# Patient Record
Sex: Female | Born: 1940 | Race: White | Hispanic: No | Smoking: Never smoker
Health system: Southern US, Community
[De-identification: ages and names within clinical notes are randomized; demographics above are authoritative.]

## PROBLEM LIST (undated history)

## (undated) DIAGNOSIS — I10 Essential (primary) hypertension: Secondary | ICD-10-CM

## (undated) DIAGNOSIS — C801 Malignant (primary) neoplasm, unspecified: Secondary | ICD-10-CM

## (undated) DIAGNOSIS — G459 Transient cerebral ischemic attack, unspecified: Secondary | ICD-10-CM

## (undated) DIAGNOSIS — D751 Secondary polycythemia: Secondary | ICD-10-CM

## (undated) DIAGNOSIS — D099 Carcinoma in situ, unspecified: Secondary | ICD-10-CM

## (undated) DIAGNOSIS — M1990 Unspecified osteoarthritis, unspecified site: Secondary | ICD-10-CM

## (undated) DIAGNOSIS — E785 Hyperlipidemia, unspecified: Secondary | ICD-10-CM

## (undated) DIAGNOSIS — J45909 Unspecified asthma, uncomplicated: Secondary | ICD-10-CM

## (undated) DIAGNOSIS — E079 Disorder of thyroid, unspecified: Secondary | ICD-10-CM

## (undated) DIAGNOSIS — I2510 Atherosclerotic heart disease of native coronary artery without angina pectoris: Secondary | ICD-10-CM

## (undated) DIAGNOSIS — E039 Hypothyroidism, unspecified: Secondary | ICD-10-CM

## (undated) DIAGNOSIS — K900 Celiac disease: Secondary | ICD-10-CM

## (undated) HISTORY — PX: BREAST SURGERY: SHX581

## (undated) HISTORY — DX: Atherosclerotic heart disease of native coronary artery without angina pectoris: I25.10

## (undated) HISTORY — DX: Hyperlipidemia, unspecified: E78.5

## (undated) HISTORY — PX: EYE SURGERY: SHX253

## (undated) HISTORY — PX: TOTAL THYROIDECTOMY: SHX2547

## (undated) HISTORY — PX: ABDOMINAL HYSTERECTOMY: SHX81

## (undated) HISTORY — PX: BLADDER SURGERY: SHX569

---
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
## 1998-04-26 ENCOUNTER — Emergency Department (HOSPITAL_COMMUNITY): Admission: EM | Admit: 1998-04-26 | Discharge: 1998-04-26 | Payer: Self-pay | Admitting: Emergency Medicine

## 1999-07-13 ENCOUNTER — Encounter: Payer: Self-pay | Admitting: Internal Medicine

## 1999-07-13 ENCOUNTER — Encounter: Admission: RE | Admit: 1999-07-13 | Discharge: 1999-07-13 | Payer: Self-pay | Admitting: *Deleted

## 2000-07-14 ENCOUNTER — Encounter: Payer: Self-pay | Admitting: Internal Medicine

## 2000-07-14 ENCOUNTER — Encounter: Admission: RE | Admit: 2000-07-14 | Discharge: 2000-07-14 | Payer: Self-pay | Admitting: Internal Medicine

## 2000-11-01 ENCOUNTER — Other Ambulatory Visit: Admission: RE | Admit: 2000-11-01 | Discharge: 2000-11-01 | Payer: Self-pay | Admitting: Internal Medicine

## 2001-07-17 ENCOUNTER — Encounter: Admission: RE | Admit: 2001-07-17 | Discharge: 2001-07-17 | Payer: Self-pay | Admitting: Internal Medicine

## 2001-07-17 ENCOUNTER — Encounter: Payer: Self-pay | Admitting: Internal Medicine

## 2011-01-19 ENCOUNTER — Encounter: Payer: Self-pay | Admitting: Internal Medicine

## 2012-05-31 ENCOUNTER — Encounter: Payer: Self-pay | Admitting: Internal Medicine

## 2014-05-19 ENCOUNTER — Other Ambulatory Visit: Payer: Self-pay

## 2014-05-19 ENCOUNTER — Other Ambulatory Visit: Payer: Self-pay | Admitting: Internal Medicine

## 2014-05-19 DIAGNOSIS — Z1231 Encounter for screening mammogram for malignant neoplasm of breast: Secondary | ICD-10-CM

## 2014-06-16 ENCOUNTER — Ambulatory Visit
Admission: RE | Admit: 2014-06-16 | Discharge: 2014-06-16 | Disposition: A | Discharge status: Home / Self Care | Payer: PRIVATE HEALTH INSURANCE | Source: Ambulatory Visit | Attending: Internal Medicine | Admitting: Internal Medicine

## 2014-06-16 DIAGNOSIS — Z1231 Encounter for screening mammogram for malignant neoplasm of breast: Secondary | ICD-10-CM

## 2014-06-18 ENCOUNTER — Ambulatory Visit: Payer: Self-pay

## 2015-06-19 ENCOUNTER — Other Ambulatory Visit: Payer: Self-pay

## 2015-06-19 DIAGNOSIS — Z1231 Encounter for screening mammogram for malignant neoplasm of breast: Secondary | ICD-10-CM

## 2015-07-10 ENCOUNTER — Ambulatory Visit
Admission: RE | Admit: 2015-07-10 | Discharge: 2015-07-10 | Disposition: A | Discharge status: Home / Self Care | Payer: Medicare Other | Source: Ambulatory Visit

## 2015-07-10 ENCOUNTER — Ambulatory Visit: Payer: Self-pay

## 2015-07-10 DIAGNOSIS — Z1231 Encounter for screening mammogram for malignant neoplasm of breast: Secondary | ICD-10-CM

## 2015-10-05 ENCOUNTER — Observation Stay (HOSPITAL_COMMUNITY): Payer: Medicare Other

## 2015-10-05 ENCOUNTER — Encounter (HOSPITAL_COMMUNITY): Payer: Self-pay | Admitting: Family Medicine

## 2015-10-05 ENCOUNTER — Observation Stay (HOSPITAL_COMMUNITY)
Admission: EM | Admit: 2015-10-05 | Discharge: 2015-10-07 | Disposition: A | Discharge status: Home / Self Care | Payer: Medicare Other | Attending: Internal Medicine | Admitting: Internal Medicine

## 2015-10-05 ENCOUNTER — Emergency Department (HOSPITAL_COMMUNITY): Payer: Medicare Other

## 2015-10-05 DIAGNOSIS — Z7982 Long term (current) use of aspirin: Secondary | ICD-10-CM | POA: Diagnosis not present

## 2015-10-05 DIAGNOSIS — H547 Unspecified visual loss: Secondary | ICD-10-CM | POA: Diagnosis not present

## 2015-10-05 DIAGNOSIS — K900 Celiac disease: Secondary | ICD-10-CM | POA: Insufficient documentation

## 2015-10-05 DIAGNOSIS — E039 Hypothyroidism, unspecified: Secondary | ICD-10-CM | POA: Insufficient documentation

## 2015-10-05 DIAGNOSIS — E785 Hyperlipidemia, unspecified: Secondary | ICD-10-CM | POA: Insufficient documentation

## 2015-10-05 DIAGNOSIS — R4781 Slurred speech: Secondary | ICD-10-CM | POA: Insufficient documentation

## 2015-10-05 DIAGNOSIS — H532 Diplopia: Secondary | ICD-10-CM | POA: Diagnosis present

## 2015-10-05 DIAGNOSIS — D751 Secondary polycythemia: Secondary | ICD-10-CM | POA: Diagnosis not present

## 2015-10-05 DIAGNOSIS — E079 Disorder of thyroid, unspecified: Secondary | ICD-10-CM | POA: Insufficient documentation

## 2015-10-05 DIAGNOSIS — G459 Transient cerebral ischemic attack, unspecified: Secondary | ICD-10-CM | POA: Diagnosis not present

## 2015-10-05 DIAGNOSIS — R4701 Aphasia: Secondary | ICD-10-CM | POA: Diagnosis not present

## 2015-10-05 DIAGNOSIS — Z8673 Personal history of transient ischemic attack (TIA), and cerebral infarction without residual deficits: Secondary | ICD-10-CM | POA: Diagnosis present

## 2015-10-05 DIAGNOSIS — R51 Headache: Secondary | ICD-10-CM

## 2015-10-05 DIAGNOSIS — R471 Dysarthria and anarthria: Secondary | ICD-10-CM | POA: Diagnosis not present

## 2015-10-05 DIAGNOSIS — R519 Headache, unspecified: Secondary | ICD-10-CM

## 2015-10-05 DIAGNOSIS — I10 Essential (primary) hypertension: Secondary | ICD-10-CM | POA: Diagnosis not present

## 2015-10-05 HISTORY — DX: Secondary polycythemia: D75.1

## 2015-10-05 HISTORY — DX: Unspecified asthma, uncomplicated: J45.909

## 2015-10-05 HISTORY — DX: Disorder of thyroid, unspecified: E07.9

## 2015-10-05 HISTORY — DX: Essential (primary) hypertension: I10

## 2015-10-05 HISTORY — DX: Malignant (primary) neoplasm, unspecified: C80.1

## 2015-10-05 HISTORY — DX: Celiac disease: K90.0

## 2015-10-05 HISTORY — DX: Transient cerebral ischemic attack, unspecified: G45.9

## 2015-10-05 LAB — CBC
HCT: 46.1 % — ABNORMAL HIGH (ref 36.0–46.0)
HEMATOCRIT: 42.4 % (ref 36.0–46.0)
HEMOGLOBIN: 14.1 g/dL (ref 12.0–15.0)
Hemoglobin: 15.5 g/dL — ABNORMAL HIGH (ref 12.0–15.0)
MCH: 29.9 pg (ref 26.0–34.0)
MCH: 29.4 pg (ref 26.0–34.0)
MCHC: 33.6 g/dL (ref 30.0–36.0)
MCHC: 33.3 g/dL (ref 30.0–36.0)
MCV: 88.8 fL (ref 78.0–100.0)
MCV: 88.5 fL (ref 78.0–100.0)
PLATELETS: 336 10*3/uL (ref 150–400)
Platelets: 323 10*3/uL (ref 150–400)
RBC: 4.79 MIL/uL (ref 3.87–5.11)
RBC: 5.19 MIL/uL — AB (ref 3.87–5.11)
RDW: 14.6 % (ref 11.5–15.5)
RDW: 14.5 % (ref 11.5–15.5)
WBC: 5 10*3/uL (ref 4.0–10.5)
WBC: 7.2 10*3/uL (ref 4.0–10.5)

## 2015-10-05 LAB — DIFFERENTIAL
BASOS PCT: 1 %
Basophils Absolute: 0 10*3/uL (ref 0.0–0.1)
EOS ABS: 0.1 10*3/uL (ref 0.0–0.7)
EOS PCT: 1 %
Lymphocytes Relative: 37 %
Lymphs Abs: 1.8 10*3/uL (ref 0.7–4.0)
MONO ABS: 0.4 10*3/uL (ref 0.1–1.0)
Monocytes Relative: 8 %
NEUTROS PCT: 53 %
Neutro Abs: 2.7 10*3/uL (ref 1.7–7.7)

## 2015-10-05 LAB — COMPREHENSIVE METABOLIC PANEL
ALBUMIN: 3.6 g/dL (ref 3.5–5.0)
ALT: 23 U/L (ref 14–54)
ANION GAP: 14 (ref 5–15)
AST: 23 U/L (ref 15–41)
Alkaline Phosphatase: 66 U/L (ref 38–126)
BUN: 9 mg/dL (ref 6–20)
CALCIUM: 9.2 mg/dL (ref 8.9–10.3)
CO2: 26 mmol/L (ref 22–32)
Chloride: 102 mmol/L (ref 101–111)
Creatinine, Ser: 0.77 mg/dL (ref 0.44–1.00)
GFR calc non Af Amer: >60 mL/min (ref >60)
GLUCOSE: 88 mg/dL (ref 65–99)
POTASSIUM: 4.5 mmol/L (ref 3.5–5.1)
SODIUM: 142 mmol/L (ref 135–145)
Total Bilirubin: 0.5 mg/dL (ref 0.3–1.2)
Total Protein: 6.5 g/dL (ref 6.5–8.1)

## 2015-10-05 LAB — PROTIME-INR
INR: 0.99 (ref 0.00–1.49)
PROTHROMBIN TIME: 13.3 s (ref 11.6–15.2)

## 2015-10-05 LAB — I-STAT CHEM 8, ED
BUN: 11 mg/dL (ref 6–20)
CALCIUM ION: 1.13 mmol/L (ref 1.13–1.30)
CHLORIDE: 103 mmol/L (ref 101–111)
CREATININE: 0.8 mg/dL (ref 0.44–1.00)
GLUCOSE: 82 mg/dL (ref 65–99)
HEMATOCRIT: 49 % — AB (ref 36.0–46.0)
Hemoglobin: 16.7 g/dL — ABNORMAL HIGH (ref 12.0–15.0)
Potassium: 4.3 mmol/L (ref 3.5–5.1)
SODIUM: 142 mmol/L (ref 135–145)
TCO2: 27 mmol/L (ref 0–100)

## 2015-10-05 LAB — SEDIMENTATION RATE: SED RATE: 14 mm/h (ref 0–22)

## 2015-10-05 LAB — LIPID PANEL
CHOL/HDL RATIO: 4.8 ratio
CHOLESTEROL: 235 mg/dL — AB (ref 0–200)
HDL: 49 mg/dL (ref >40)
LDL Cholesterol: 142 mg/dL — ABNORMAL HIGH (ref 0–99)
TRIGLYCERIDES: 222 mg/dL — AB (ref <150)
VLDL: 44 mg/dL — AB (ref 0–40)

## 2015-10-05 LAB — APTT: aPTT: 27 seconds (ref 24–37)

## 2015-10-05 LAB — TSH: TSH: 0.097 u[IU]/mL — ABNORMAL LOW (ref 0.350–4.500)

## 2015-10-05 LAB — I-STAT TROPONIN, ED: Troponin i, poc: 0 ng/mL (ref 0.00–0.08)

## 2015-10-05 LAB — TROPONIN I: Troponin I: <0.03 ng/mL (ref <0.031)

## 2015-10-05 MED ORDER — LEVOTHYROXINE SODIUM 175 MCG PO TABS
175.0000 ug | ORAL_TABLET | Freq: Every day | ORAL | Status: DC
  Administered 2015-10-06: 175 ug via ORAL
  Filled 2015-10-05 (×2): qty 1

## 2015-10-05 MED ORDER — STROKE: EARLY STAGES OF RECOVERY BOOK
Freq: Once | Status: DC
  Filled 2015-10-05: qty 1

## 2015-10-05 MED ORDER — BECLOMETHASONE DIPROPIONATE 80 MCG/ACT IN AERS
2.0000 | INHALATION_SPRAY | Freq: Two times a day (BID) | RESPIRATORY_TRACT | Status: DC
  Administered 2015-10-05: 2 via RESPIRATORY_TRACT

## 2015-10-05 MED ORDER — DEXAMETHASONE SODIUM PHOSPHATE 10 MG/ML IJ SOLN
10.0000 mg | Freq: Once | INTRAMUSCULAR | Status: AC
  Administered 2015-10-05: 10 mg via INTRAVENOUS
  Filled 2015-10-05: qty 1

## 2015-10-05 MED ORDER — IOHEXOL 350 MG/ML SOLN
100.0000 mL | Freq: Once | INTRAVENOUS | Status: AC | PRN
Start: 2015-10-05 — End: 2015-10-05
  Administered 2015-10-05: 100 mL via INTRAVENOUS

## 2015-10-05 MED ORDER — ASPIRIN 325 MG PO TABS
325.0000 mg | ORAL_TABLET | Freq: Every day | ORAL | Status: DC
  Administered 2015-10-05 – 2015-10-07 (×3): 325 mg via ORAL
  Filled 2015-10-05 (×3): qty 1

## 2015-10-05 MED ORDER — ATORVASTATIN CALCIUM 10 MG PO TABS
10.0000 mg | ORAL_TABLET | Freq: Every day | ORAL | Status: DC
  Administered 2015-10-06: 10 mg via ORAL
  Filled 2015-10-05 (×2): qty 1

## 2015-10-05 MED ORDER — ASPIRIN 300 MG RE SUPP: 300.0000 mg | Freq: Every day | RECTAL | Status: DC

## 2015-10-05 MED ORDER — ACETAMINOPHEN 325 MG PO TABS: 650.0000 mg | ORAL_TABLET | ORAL | Status: DC | PRN

## 2015-10-05 MED ORDER — SODIUM CHLORIDE 0.9 % IV BOLUS (SEPSIS)
1000.0000 mL | Freq: Once | INTRAVENOUS | Status: AC
  Administered 2015-10-05: 1000 mL via INTRAVENOUS

## 2015-10-05 MED ORDER — METOCLOPRAMIDE HCL 5 MG/ML IJ SOLN
10.0000 mg | Freq: Once | INTRAMUSCULAR | Status: AC
  Administered 2015-10-05: 10 mg via INTRAVENOUS
  Filled 2015-10-05: qty 2

## 2015-10-05 MED ORDER — ENOXAPARIN SODIUM 30 MG/0.3ML ~~LOC~~ SOLN
30.0000 mg | SUBCUTANEOUS | Status: DC
  Administered 2015-10-05 – 2015-10-06 (×2): 30 mg via SUBCUTANEOUS
  Filled 2015-10-05 (×2): qty 0.3

## 2015-10-05 MED ORDER — DIPHENHYDRAMINE HCL 50 MG/ML IJ SOLN
25.0000 mg | Freq: Once | INTRAMUSCULAR | Status: AC
  Administered 2015-10-05: 25 mg via INTRAVENOUS
  Filled 2015-10-05: qty 1

## 2015-10-05 MED ORDER — ATORVASTATIN CALCIUM 10 MG PO TABS
10.0000 mg | ORAL_TABLET | Freq: Every day | ORAL | Status: DC
  Administered 2015-10-05: 10 mg via ORAL

## 2015-10-05 MED ORDER — BUDESONIDE 0.25 MG/2ML IN SUSP
0.2500 mg | Freq: Two times a day (BID) | RESPIRATORY_TRACT | Status: DC
  Filled 2015-10-05 (×2): qty 2

## 2015-10-05 MED ORDER — SENNOSIDES-DOCUSATE SODIUM 8.6-50 MG PO TABS: 1.0000 | ORAL_TABLET | Freq: Every evening | ORAL | Status: DC | PRN

## 2015-10-05 MED ORDER — ACETAMINOPHEN 650 MG RE SUPP: 650.0000 mg | RECTAL | Status: DC | PRN

## 2015-10-05 NOTE — H&P (Signed)
[facility] History and Physical  DANIAH ZALDIVAR EVO:[id number] DOB: February 19, 1941 DOA: 10/05/2015  Referring physician: Lanetta Inch PCP: No primary care provider on file.   Chief Complaint: headache/blurred vision  HPI: Julie [name] is a 75 y.o. female past medical history that includes thyroid disease, celiac disease since emergency Department chief complaint 5 day history of headache blurred vision and expressive aphasia. Symptoms resolved while in the emergency department. She is admitted for TIA workup  Information is obtained from the patient. Reports intermittent headache over the last 5 days. Describes the pain as throbbing located in the front. Morning she reported about a 30 minute episode double vision on the right and some  slurred speech. Notes "i was unable to say [facility]". She denies chest pain palpitation dizziness syncope or near-syncope. She denies any numbness or tingling of her extremities. He denies difficulty swallowing. She denies abdominal pain nausea vomiting dysuria hematuria frequency or urgency.  She denies unsteady gait fever chills recent travel or sick contacts  In the emergency department she's afebrile hemodynamically stable not hypoxic. Symptoms are resolved. She is provided with Decadron and Benadryl and Reglan.  Review of Systems:  10 point review of systems complete and all systems are negative except as indicated in the history of present illness  Past Medical History  Diagnosis Date  . Thyroid disease   . Celiac disease   . TIA (transient ischemic attack)    Past Surgical History  Procedure Laterality Date  . Abdominal hysterectomy     Social History:  reports that she has never smoked. She does not have any smokeless tobacco history on file. She reports that she drinks alcohol. Her drug history is not on file.  Allergies  Allergen Reactions  . Sulfa Antibiotics Swelling  . Demerol [Meperidine] Rash  . Morphine And Related Rash    Family  History  Problem Relation Age of Onset  . Hypertension Mother   . Hypertension Father     Prior to Admission medications   Medication Sig Start Date End Date Taking? Authorizing Provider  amoxicillin-clavulanate (AUGMENTIN) 875-125 MG tablet Take 1 tablet by mouth 2 (two) times daily. Started on 09-30-15   Yes Historical Provider, MD  beclomethasone (QVAR) 80 MCG/ACT inhaler Inhale 2 puffs into the lungs 2 (two) times daily.   Yes Historical Provider, MD  Cyanocobalamin (VITAMIN B-12 IJ) Inject 1,000 mg as directed every 30 (thirty) days.   Yes Historical Provider, MD  fluticasone (FLONASE) 50 MCG/ACT nasal spray Place 1 spray into both nostrils daily.   Yes Historical Provider, MD  levothyroxine (SYNTHROID, LEVOTHROID) 175 MCG tablet Take 175 mcg by mouth daily before breakfast.   Yes Historical Provider, MD  levothyroxine (SYNTHROID, LEVOTHROID) 200 MCG tablet Take 200 mcg by mouth daily before breakfast.   Yes Historical Provider, MD  Multiple Vitamins-Minerals (MULTIVITAMIN WITH MINERALS) tablet Take 1 tablet by mouth daily.   Yes Historical Provider, MD  Probiotic Product (PRO-BIOTIC BLEND PO) Take 1 tablet by mouth daily.   Yes Historical Provider, MD   Physical Exam: Filed Vitals:   10/05/15 1345 10/05/15 1659  BP: 156/80 137/72  Pulse: 55 51  Temp: 97.9 F (36.6 C)   TempSrc: Oral   Resp: 18 12  SpO2: 98% 100%    Wt Readings from Last 3 Encounters:  No data found for Wt    General:  Appears calm and comfortable Eyes: PERRL, normal lids, irises & conjunctiva ENT: grossly normal hearing, lips & tongue Neck: no LAD, masses  or thyromegaly Cardiovascular: RRR, no m/r/g. No LE edema. Pedal pulses present and palpable Respiratory: CTA bilaterally, no w/r/r. Normal respiratory effort. Abdomen: soft, ntnd positive sounds throughout Skin: no rash or induration seen on limited exam Musculoskeletal: grossly normal tone BUE/BLE joints without swelling/erythema Psychiatric: grossly  normal mood and affect, speech fluent and appropriate Neurologic: grossly non-focal. Speech clear facial symmetry cranial nerves II through XII grossly intact           Labs on Admission:  Basic Metabolic Panel:  Recent Labs Lab 10/05/15 1349 10/05/15 1437  NA 142 142  K 4.5 4.3  CL 102 103  CO2 26  --   GLUCOSE 88 82  BUN 9 11  CREATININE 0.77 0.80  CALCIUM 9.2  --    Liver Function Tests:  Recent Labs Lab 10/05/15 1349  AST 23  ALT 23  ALKPHOS 66  BILITOT 0.5  PROT 6.5  ALBUMIN 3.6   No results for input(s): LIPASE, AMYLASE in the last 168 hours. No results for input(s): AMMONIA in the last 168 hours. CBC:  Recent Labs Lab 10/05/15 1349 10/05/15 1437  WBC 5.0  --   NEUTROABS 2.7  --   HGB 15.5* 16.7*  HCT 46.1* 49.0*  MCV 88.8  --   PLT 336  --    Cardiac Enzymes: No results for input(s): CKTOTAL, CKMB, CKMBINDEX, TROPONINI in the last 168 hours.  BNP (last 3 results) No results for input(s): BNP in the last 8760 hours.  ProBNP (last 3 results) No results for input(s): PROBNP in the last 8760 hours.  CBG: No results for input(s): GLUCAP in the last 168 hours.  Radiological Exams on Admission: Dg Chest 2 View  10/05/2015  CLINICAL DATA:  Headache x 5 days, slurred speech and loss of vision x 1 day EXAM: CHEST  2 VIEW COMPARISON:  None. FINDINGS: The heart size and mediastinal contours are within normal limits. Both lungs are clear. Moderate mid thoracic spondylosis. IMPRESSION: No active cardiopulmonary disease. Electronically Signed   By: Nolon Nations M.D.   On: 10/05/2015 16:20   Ct Head Wo Contrast  10/05/2015  CLINICAL DATA:  Headache for the last 5 days.  Visual changes. EXAM: CT HEAD WITHOUT CONTRAST TECHNIQUE: Contiguous axial images were obtained from the base of the skull through the vertex without intravenous contrast. COMPARISON:  None. FINDINGS: Ventricles are normal in size and configuration. All areas of the brain demonstrate normal  gray-white matter attenuation. There is no mass, hemorrhage, edema or other evidence of acute parenchymal abnormality. No extra-axial hemorrhage. No osseous abnormality. 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 Visualized upper paranasal sinuses are clear. Mastoid air cells are clear. Superficial soft tissues are unremarkable. Orbital/periorbital structures are unremarkable. IMPRESSION: Negative head CT.  No intracranial mass, hemorrhage or edema. Electronically Signed   By: Franki Cabot M.D.   On: 10/05/2015 14:23    EKG: Independently reviewed SR  Assessment/Plan Principal Problem:   TIA (transient ischemic attack) Active Problems:   Thyroid disease   Polycythemia   Diplopia  #1. TIA/diplopia/expressive aphasia. Symptoms resolved in the emergency department. No noted risk factors. CT of the head without acute changes. No cranial mass hemorrhage or edema -Admit to telemetry for TIA workup -Will obtain MRI/MRA, carotid Dopplers, 2-D echo -Will obtain lipid panel hemoglobin A1c -Speech therapy for evaluation, his current therapy for evaluation -Bedside swallow eval heart healthy diet once she has passed -Aspirin and statin - awaot  Neurology recommendations  #2. Thyroid disease. -Obtain a TSH -Continue home meds  #3. Polycythemia. Hemoglobin 16.7 in the emergency department. They be related to above. Records for comparison to previous levels -Repeat in the morning -May need outpatient follow-up  #4. Celiac disease. Patient reports diagnosis and April 2016. Appears stable at baseline -Gluten-free diet   Code Status: full DVT Prophylaxis: Family Communication: none present Disposition Plan: home hopefully 24 hours  Time spent: 3 minutes  [facility]

## 2015-10-05 NOTE — ED Provider Notes (Signed)
CSN: [id number]     Arrival date & time 10/05/15  1237 History   First MD Initiated Contact with Patient 10/05/15 1423     Chief Complaint  Patient presents with  . Headache     (Consider location/radiation/quality/duration/timing/severity/associated sxs/prior Treatment) Patient is a 75 y.o. female presenting with headaches. The history is provided by the patient.  Headache Pain location:  Frontal Quality:  Dull Radiates to:  Does not radiate Severity currently:  5/10 Onset quality:  Gradual Duration:  5 days Timing:  Constant Progression:  Unchanged Chronicity:  New Similar to prior headaches: no   Context: not eating   Context comment:  No inciting events Relieved by:  Nothing Worsened by:  Nothing Ineffective treatments:  NSAIDs Associated symptoms: sore throat (being treated for strep throat currently) and visual change (10:30am and resolved in 48mn)   Associated symptoms: no abdominal pain, no congestion, no cough, no diarrhea, no fever, no focal weakness, no nausea, no neck pain, no photophobia and no vomiting   Associated symptoms comment:  Speech difficulty 12:30pm    Past Medical History  Diagnosis Date  . Thyroid disease   . Celiac disease   . TIA (transient ischemic attack)    Past Surgical History  Procedure Laterality Date  . Abdominal hysterectomy     Family History  Problem Relation Age of Onset  . Hypertension Mother   . Hypertension Father    Social History  Substance Use Topics  . Smoking status: Never Smoker   . Smokeless tobacco: None  . Alcohol Use: Yes     Comment: wine   OB History    No data available     Review of Systems  Constitutional: Negative for fever.  HENT: Positive for sore throat (being treated for strep throat currently). Negative for congestion.   Eyes: Negative for photophobia.  Respiratory: Negative for cough and shortness of breath.   Gastrointestinal: Negative for nausea, vomiting, abdominal pain and diarrhea.   Genitourinary: Negative.   Musculoskeletal: Negative for neck pain.  Skin: Negative for pallor, rash and wound.  Neurological: Positive for speech difficulty and headaches. Negative for focal weakness.  Psychiatric/Behavioral: Negative.       Allergies  Sulfa antibiotics; Demerol; and Morphine and related  Home Medications   Prior to Admission medications   Medication Sig Start Date End Date Taking? Authorizing Provider  amoxicillin-clavulanate (AUGMENTIN) 875-125 MG tablet Take 1 tablet by mouth 2 (two) times daily. Started on 09-30-15   Yes Historical Provider, MD  beclomethasone (QVAR) 80 MCG/ACT inhaler Inhale 2 puffs into the lungs 2 (two) times daily.   Yes Historical Provider, MD  Cyanocobalamin (VITAMIN B-12 IJ) Inject 1,000 mg as directed every 30 (thirty) days.   Yes Historical Provider, MD  fluticasone (FLONASE) 50 MCG/ACT nasal spray Place 1 spray into both nostrils daily.   Yes Historical Provider, MD  levothyroxine (SYNTHROID, LEVOTHROID) 175 MCG tablet Take 175 mcg by mouth daily before breakfast.   Yes Historical Provider, MD  levothyroxine (SYNTHROID, LEVOTHROID) 200 MCG tablet Take 200 mcg by mouth daily before breakfast.   Yes Historical Provider, MD  Multiple Vitamins-Minerals (MULTIVITAMIN WITH MINERALS) tablet Take 1 tablet by mouth daily.   Yes Historical Provider, MD  Probiotic Product (PRO-BIOTIC BLEND PO) Take 1 tablet by mouth daily.   Yes Historical Provider, MD   BP 156/80 mmHg  Pulse 55  Temp(Src) 97.9 F (36.6 C) (Oral)  Resp 18  SpO2 98% Physical Exam  Constitutional: She is oriented to  person, place, and time. She appears well-developed and well-nourished. No distress.  HENT:  Head: Normocephalic and atraumatic.  Mouth/Throat: No oropharyngeal exudate.  Eyes: Pupils are equal, round, and reactive to light. No scleral icterus.  Neck: Normal range of motion. Neck supple. No JVD present.  No bruits  Cardiovascular: Normal rate, regular rhythm,  normal heart sounds and intact distal pulses.   Pulmonary/Chest: Effort normal and breath sounds normal. She has no wheezes. She has no rales.  Abdominal: Soft. She exhibits no distension. There is no tenderness. There is no rebound and no guarding.  Musculoskeletal: Normal range of motion. She exhibits no edema or tenderness.  Neurological: She is alert and oriented to person, place, and time. She has normal strength. No cranial nerve deficit or sensory deficit. She exhibits normal muscle tone. She displays a negative Romberg sign. Coordination and gait normal. GCS eye subscore is 4. GCS verbal subscore is 5. GCS motor subscore is 6.  Skin: Skin is warm and dry. No rash noted. She is not diaphoretic. No erythema. No pallor.  Psychiatric: She has a normal mood and affect.  Nursing note and vitals reviewed.   ED Course  Procedures (including critical care time) Labs Review Labs Reviewed  CBC - Abnormal; Notable for the following:    RBC 5.19 (*)    Hemoglobin 15.5 (*)    HCT 46.1 (*)    All other components within normal limits  I-STAT CHEM 8, ED - Abnormal; Notable for the following:    Hemoglobin 16.7 (*)    HCT 49.0 (*)    All other components within normal limits  PROTIME-INR  APTT  DIFFERENTIAL  COMPREHENSIVE METABOLIC PANEL  SEDIMENTATION RATE  HEMOGLOBIN A1C  LIPID PANEL  I-STAT TROPOININ, ED  CBG MONITORING, ED    Imaging Review Ct Head Wo Contrast  10/05/2015  CLINICAL DATA:  Headache for the last 5 days.  Visual changes. EXAM: CT HEAD WITHOUT CONTRAST TECHNIQUE: Contiguous axial images were obtained from the base of the skull through the vertex without intravenous contrast. COMPARISON:  None. FINDINGS: Ventricles are normal in size and configuration. All areas of the brain demonstrate normal gray-white matter attenuation. There is no mass, hemorrhage, edema or other evidence of acute parenchymal abnormality. No extra-axial hemorrhage. No osseous abnormality. There are  chronic calcified atherosclerotic changes of the large vessels at the skull base. Visualized upper paranasal sinuses are clear. Mastoid air cells are clear. Superficial soft tissues are unremarkable. Orbital/periorbital structures are unremarkable. IMPRESSION: Negative head CT.  No intracranial mass, hemorrhage or edema. Electronically Signed   By: Franki Cabot M.D.   On: 10/05/2015 14:23   I have personally reviewed and evaluated these images and lab results as part of my medical decision-making.   EKG Interpretation None      MDM   Final diagnoses:  Transient cerebral ischemia, unspecified transient cerebral ischemia type  Acute nonintractable headache, unspecified headache type    Patient is a 75 year old female who presents with 5 days of stable throbbing frontal headache and today with a 30 minute episode of right visual field blurry vision as well as a one hr episode of expressive aphasia. Otherwise is completely at her baseline now and is without complaints. Further history and exam as above notable for stable vital signs and intact neurologic exam. Head CT without acute intracranial abnormalities. Troponin CBC and CMP reassuring. EKG without arrhythmia or acute ischemic changes. Concern for TIA at this time. Neurology consulted. Will treat headache symptomatically. Patient  will be admitted to medicine for further management and evaluation.    Heriberto Antigua, MD 10/05/15 [place], MD 10/06/15 [phone]

## 2015-10-05 NOTE — ED Notes (Signed)
Pt here for episode earlier of loss of vision and unable to get words out. sts unable to read. sts fully resolved that she thinks. Pt speaking normal. sts headache x 5 days and taking meds for strep throat. Denies any weakness, numbness.

## 2015-10-05 NOTE — Consult Note (Addendum)
Requesting Physician: Dr. Ayesha Rumpf    Chief Complaint: transient expressive aphasia and monocular diplopia  HPI:                                                                                                                                         Julie [name] is an 75 y.o. female who was fine this AM until about 1100. She suddenly noted she had diplopia on her right visual field. She "could see the same image in front of her and it was reflected to her right visual field. When she closed her left eye the double vision persisted but when she closed her right eye is resolved." the diplopia resolved in 30 minutes but then she noted she had expressive aphasia for about 15 minutes."  No there symptoms. She has had elevated BP for about 5 days and HA. currently she has low grade frontal HA but no other symptoms.   Date last known well: 1.30.17 Time last known well: Time: 11:00      Past Medical History  Diagnosis Date  . Thyroid disease   . Celiac disease     Past Surgical History  Procedure Laterality Date  . Abdominal hysterectomy      Family History  Problem Relation Age of Onset  . Hypertension Mother   . Hypertension Father    Social History:  reports that she has never smoked. She does not have any smokeless tobacco history on file. She reports that she drinks alcohol. Her drug history is not on file.  Allergies:  Allergies  Allergen Reactions  . Sulfa Antibiotics Swelling  . Demerol [Meperidine] Rash  . Morphine And Related Rash    Medications:                                                                                                                           Current Facility-Administered Medications  Medication Dose Route Frequency Provider Last Rate Last Dose  . dexamethasone (DECADRON) injection 10 mg  10 mg Intravenous Once Heriberto Antigua, MD      . diphenhydrAMINE (BENADRYL) injection 25 mg  25 mg Intravenous Once Heriberto Antigua, MD      . metoCLOPramide (REGLAN)  injection 10 mg  10 mg Intravenous Once Heriberto Antigua, MD      . sodium chloride 0.9 % bolus 1,000 mL  1,000 mL Intravenous  Once Heriberto Antigua, MD       Current Outpatient Prescriptions  Medication Sig Dispense Refill  . amoxicillin-clavulanate (AUGMENTIN) 875-125 MG tablet Take 1 tablet by mouth 2 (two) times daily. Started on 09-30-15    . beclomethasone (QVAR) 80 MCG/ACT inhaler Inhale 2 puffs into the lungs 2 (two) times daily.    . Cyanocobalamin (VITAMIN B-12 IJ) Inject 1,000 mg as directed every 30 (thirty) days.    . fluticasone (FLONASE) 50 MCG/ACT nasal spray Place 1 spray into both nostrils daily.    Marland Kitchen levothyroxine (SYNTHROID, LEVOTHROID) 175 MCG tablet Take 175 mcg by mouth daily before breakfast.    . levothyroxine (SYNTHROID, LEVOTHROID) 200 MCG tablet Take 200 mcg by mouth daily before breakfast.    . Multiple Vitamins-Minerals (MULTIVITAMIN WITH MINERALS) tablet Take 1 tablet by mouth daily.    . Probiotic Product (PRO-BIOTIC BLEND PO) Take 1 tablet by mouth daily.       ROS:                                                                                                                                       History obtained from the patient  General ROS: negative for - chills, fatigue, fever, night sweats, weight gain or weight loss Psychological ROS: negative for - behavioral disorder, hallucinations, memory difficulties, mood swings or suicidal ideation Ophthalmic ROS: negative for - blurry vision, double vision, eye pain or loss of vision ENT ROS: negative for - epistaxis, nasal discharge, oral lesions, sore throat, tinnitus or vertigo Allergy and Immunology ROS: negative for - hives or itchy/watery eyes Hematological and Lymphatic ROS: negative for - bleeding problems, bruising or swollen lymph nodes Endocrine ROS: negative for - galactorrhea, hair pattern changes, polydipsia/polyuria or temperature intolerance Respiratory ROS: negative for - cough, hemoptysis, shortness  of breath or wheezing Cardiovascular ROS: negative for - chest pain, dyspnea on exertion, edema or irregular heartbeat Gastrointestinal ROS: negative for - abdominal pain, diarrhea, hematemesis, nausea/vomiting or stool incontinence Genito-Urinary ROS: negative for - dysuria, hematuria, incontinence or urinary frequency/urgency Musculoskeletal ROS: negative for - joint swelling or muscular weakness Neurological ROS: as noted in HPI Dermatological ROS: negative for rash and skin lesion changes  Neurologic Examination:                                                                                                      Blood pressure 156/80, pulse 55, temperature 97.9 F (36.6 C), temperature source Oral, resp. rate 18, SpO2 98 %.  HEENT-  Normocephalic, no lesions, without obvious abnormality.  Normal external eye and conjunctiva.  Normal TM's bilaterally.  Normal auditory canals and external ears. Normal external nose, mucus membranes and septum.  Normal pharynx. Cardiovascular- S1, S2 normal, pulses palpable throughout   Lungs- chest clear, no wheezing, rales, normal symmetric air entry Abdomen- normal findings: bowel sounds normal Extremities- no edema Lymph-no adenopathy palpable Musculoskeletal-no joint tenderness, deformity or swelling Skin-warm and dry, no hyperpigmentation, vitiligo, or suspicious lesions  Neurological Examination Mental Status: Alert, oriented, thought content appropriate.  Speech fluent without evidence of aphasia.  Able to follow 3 step commands without difficulty. Cranial Nerves: II: Discs flat bilaterally; Visual fields grossly normal, pupils equal, round, reactive to light and accommodation III,IV, VI: ptosis not present, extra-ocular motions intact bilaterally V,VII: smile symmetric, facial light touch sensation normal bilaterally VIII: hearing normal bilaterally IX,X: uvula rises symmetrically XI: bilateral shoulder shrug XII: midline tongue  extension Motor: Right : Upper extremity   5/5    Left:     Upper extremity   5/5  Lower extremity   5/5     Lower extremity   5/5 Tone and bulk:normal tone throughout; no atrophy noted Sensory: Pinprick and light touch intact throughout, bilaterally Deep Tendon Reflexes: 2+ and symmetric throughout Plantars: Right: downgoing   Left: downgoing Cerebellar: normal finger-to-nose  and normal heel-to-shin test Gait: not tested       Lab Results: Basic Metabolic Panel:  Recent Labs Lab 10/05/15 1349 10/05/15 1437  NA 142 142  K 4.5 4.3  CL 102 103  CO2 26  --   GLUCOSE 88 82  BUN 9 11  CREATININE 0.77 0.80  CALCIUM 9.2  --     Liver Function Tests:  Recent Labs Lab 10/05/15 1349  AST 23  ALT 23  ALKPHOS 66  BILITOT 0.5  PROT 6.5  ALBUMIN 3.6   No results for input(s): LIPASE, AMYLASE in the last 168 hours. No results for input(s): AMMONIA in the last 168 hours.  CBC:  Recent Labs Lab 10/05/15 1349 10/05/15 1437  WBC 5.0  --   NEUTROABS 2.7  --   HGB 15.5* 16.7*  HCT 46.1* 49.0*  MCV 88.8  --   PLT 336  --     Cardiac Enzymes: No results for input(s): CKTOTAL, CKMB, CKMBINDEX, TROPONINI in the last 168 hours.  Lipid Panel: No results for input(s): CHOL, TRIG, HDL, CHOLHDL, VLDL, LDLCALC in the last 168 hours.  CBG: No results for input(s): GLUCAP in the last 168 hours.  Microbiology: No results found for this or any previous visit.  Coagulation Studies:  Recent Labs  10/05/15 1349  LABPROT 13.3  INR 0.99    Imaging: Ct Head Wo Contrast  10/05/2015  CLINICAL DATA:  Headache for the last 5 days.  Visual changes. EXAM: CT HEAD WITHOUT CONTRAST TECHNIQUE: Contiguous axial images were obtained from the base of the skull through the vertex without intravenous contrast. COMPARISON:  None. FINDINGS: Ventricles are normal in size and configuration. All areas of the brain demonstrate normal gray-white matter attenuation. There is no mass,  hemorrhage, edema or other evidence of acute parenchymal abnormality. No extra-axial hemorrhage. No osseous abnormality. 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 Visualized upper paranasal sinuses are clear. Mastoid air cells are clear. Superficial soft tissues are unremarkable. Orbital/periorbital structures are unremarkable. IMPRESSION: Negative head CT.  No intracranial mass, hemorrhage or edema. Electronically Signed   By: Cherlynn Kaiser  Enriqueta Shutter M.D.   On: 10/05/2015 14:23    Assessment and plan discussed with with attending physician and they are in agreement.    Etta Quill PA-C [facility] [phone]  10/05/2015, 3:36 PM   Assessment: 74 y.o. female with transient right sided visual disturbance and aphasia. She describes it as being right eye, but I wonder if it was actually right visual field which would go along with her speech disturbance. I suspect TIA. She does describe headache, and it may be worthwhile to check an ESR as well.   Stroke Risk Factors - none  1. HgbA1c, fasting lipid panel 2. MRI of the brain without contrast 3. Frequent neuro checks 4. Echocardiogram 5. CT angio head and neck 6. Prophylactic therapy-Antiplatelet med: Aspirin - dose 362m PO or 3073mPR 7. Risk factor modification 8. Telemetry monitoring 9. ESR  McRoland RackMD [facility] [phone] 7pm- 7am, please page neurology on call as listed [place]

## 2015-10-05 NOTE — Progress Notes (Signed)
Pt arrived to unit at [facility], admitted to room 5M14. ER staff assisted pt off stretcher to bathroom. Pt ambulated to bed independently with steady gait from bathroom. Placed on tele box # 14 and called central monitoring and requested secretary to send another staff member in to verify telemetry box and pt name with central. Neuro noted to be fully intact upon assessment. Assisted pt with ordering food, on gluten free diet. Pt resting comfortablely in bed, provided water and applesauce until tray comes from kitchen. Report given to next RN.

## 2015-10-05 NOTE — ED Notes (Signed)
Pt transported to xray 

## 2015-10-06 ENCOUNTER — Observation Stay (HOSPITAL_BASED_OUTPATIENT_CLINIC_OR_DEPARTMENT_OTHER): Payer: Medicare Other

## 2015-10-06 ENCOUNTER — Encounter (HOSPITAL_COMMUNITY): Payer: Medicare Other

## 2015-10-06 DIAGNOSIS — D751 Secondary polycythemia: Secondary | ICD-10-CM | POA: Diagnosis not present

## 2015-10-06 DIAGNOSIS — G459 Transient cerebral ischemic attack, unspecified: Secondary | ICD-10-CM

## 2015-10-06 DIAGNOSIS — G43909 Migraine, unspecified, not intractable, without status migrainosus: Secondary | ICD-10-CM | POA: Diagnosis not present

## 2015-10-06 DIAGNOSIS — E079 Disorder of thyroid, unspecified: Secondary | ICD-10-CM | POA: Diagnosis not present

## 2015-10-06 DIAGNOSIS — K900 Celiac disease: Secondary | ICD-10-CM | POA: Diagnosis not present

## 2015-10-06 LAB — TSH: TSH: 0.044 u[IU]/mL — ABNORMAL LOW (ref 0.350–4.500)

## 2015-10-06 LAB — T4, FREE: Free T4: 1.66 ng/dL — ABNORMAL HIGH (ref 0.61–1.12)

## 2015-10-06 LAB — HEMOGLOBIN A1C
Hgb A1c MFr Bld: 5.7 % — ABNORMAL HIGH (ref 4.8–5.6)
Mean Plasma Glucose: 117 mg/dL

## 2015-10-06 LAB — TROPONIN I: Troponin I: <0.03 ng/mL (ref <0.031)

## 2015-10-06 MED ORDER — LEVOTHYROXINE SODIUM 25 MCG PO TABS
125.0000 ug | ORAL_TABLET | Freq: Every day | ORAL | Status: DC
  Administered 2015-10-07: 125 ug via ORAL
  Filled 2015-10-06: qty 1

## 2015-10-06 MED ORDER — LEVOTHYROXINE SODIUM 75 MCG PO TABS: 150.0000 ug | ORAL_TABLET | Freq: Every day | ORAL | Status: DC

## 2015-10-06 NOTE — Progress Notes (Signed)
  Echocardiogram 2D Echocardiogram has been performed.  Julie [name] 10/06/2015, 5:00 PM

## 2015-10-06 NOTE — Progress Notes (Signed)
Called [facility] they are backed up, will defiantly get her ECHO done tomorrow morning at the latest.

## 2015-10-06 NOTE — Evaluation (Signed)
SLP Cancellation Note  Patient Details Name: ANASOFIA MICALLEF MRN: [id number] DOB: [birth year]   Cancelled treatment:       Reason Eval/Treat Not Completed: SLP screened, no needs identified, will sign off   Luanna Salk, [place] [facility] SLP [phone]

## 2015-10-06 NOTE — Progress Notes (Signed)
PT Cancellation Note  Patient Details Name: Julie [name] MRN: [id number] DOB: [birth year]   Cancelled Treatment:    Reason Eval/Treat Not Completed: PT screened, no needs identified, will sign off  Physical Therapy Note  Patient is functioning at a high level of independence and no physical therapy is indicated at this time. Tolerates higher level dynamic gait tasks without loss of balance. Reports she is at her baseline with mobility. PT is signing-off. Please re-order if there is any significant change in status. Thank you for this referral.  Elayne Snare, [place]     Ellouise Newer 10/06/2015, 10:20 AM

## 2015-10-06 NOTE — Progress Notes (Signed)
PATIENT DETAILS Name: Julie [name] Age: 75 y.o. Sex: female Date of Birth: [birth year] Admit Date: 10/05/2015 Admitting Physician Waldemar Dickens, MD PCP:No primary care provider on file.  Subjective: No headache. Diplopia/dysarthria have completely resolved.  Assessment/Plan: Principal Problem: TIA (transient ischemic attack) vs migraine with aura: No further headaches, dysarthria/diplopia have completely resolved. No focal deficits on exam. MRI brain and negative for acute CVA, CTA neck negative for major stenosis. Echocardiogram pending. LDL 142, A1c 5.7. Continue aspirin, trial of statins (intolerant to statins in the past). Await further recommendations from neurology.  Active Problems: Dyslipidemia: LDL 142-has been intolerant to statins in the past-trial of 10 mg Lipitor to assess tolerance. If does not tolerate, will require PCSK9 inhibitors as outpatient  Hypothyroidism: TSH suppressed-takes 200 mcg and 175 mcg on alternate days. Will decrease 150 mcg daily-repeat TSH in 3 months.  History of celiac disease: Controlled with gluten-free diet. No further diarrhea  Polycythemia: Very mild-continue aspirin-suspect further monitoring could be done in the outpatient setting.  Disposition: Remain inpatient  Antimicrobial agents  See below  Anti-infectives    None      DVT Prophylaxis: Prophylactic Lovenox   Code Status: Full code  Family Communication None at bedside  Procedures: None  CONSULTS:  neurology  Time spent 30 minutes-Greater than 50% of this time was spent in counseling, explanation of diagnosis, planning of further management, and coordination of care.  MEDICATIONS: Scheduled Meds: .  stroke: mapping our early stages of recovery book   Does not apply Once  . aspirin  300 mg Rectal Daily   Or  . aspirin  325 mg Oral Daily  . atorvastatin  10 mg Oral q1800  . budesonide (PULMICORT) nebulizer solution  0.25 mg Nebulization BID    . enoxaparin (LOVENOX) injection  30 mg Subcutaneous Q24H  . levothyroxine  175 mcg Oral QAC breakfast   Continuous Infusions:  PRN Meds:.acetaminophen **OR** acetaminophen, senna-docusate    PHYSICAL EXAM: Vital signs in last 24 hours: Filed Vitals:   10/05/15 2300 10/06/15 0139 10/06/15 0330 10/06/15 1008  BP: 118/58 108/58 119/58 149/56  Pulse: 70 68 56 60  Temp: 97.7 F (36.5 C) 97.8 F (36.6 C) 97.8 F (36.6 C) 98 F (36.7 C)  TempSrc: Oral Oral Oral Oral  Resp: 16 18 17 18   SpO2: 98% 98% 99% 98%    Weight change:  There were no vitals filed for this visit. There is no height or weight on file to calculate BMI.   Gen Exam: Awake and alert with clear speech.  Neck: Supple, No JVD.   Chest: B/L Clear.   CVS: S1 S2 Regular, no murmurs.  Abdomen: soft, BS +, non tender, non distended.  Extremities: no edema, lower extremities warm to touch. Neurologic: Non Focal.   Skin: No Rash.   Wounds: N/A.    Intake/Output from previous day:  Intake/Output Summary (Last 24 hours) at 10/06/15 1334 Last data filed at 10/06/15 0459  Gross per 24 hour  Intake   1120 ml  Output    200 ml  Net    920 ml     LAB RESULTS: CBC  Recent Labs Lab 10/05/15 1349 10/05/15 1437 10/05/15 2344  WBC 5.0  --  7.2  HGB 15.5* 16.7* 14.1  HCT 46.1* 49.0* 42.4  PLT 336  --  323  MCV 88.8  --  88.5  MCH 29.9  --  29.4  MCHC 33.6  --  33.3  RDW 14.6  --  14.5  LYMPHSABS 1.8  --   --   MONOABS 0.4  --   --   EOSABS 0.1  --   --   BASOSABS 0.0  --   --     Chemistries   Recent Labs Lab 10/05/15 1349 10/05/15 1437  NA 142 142  K 4.5 4.3  CL 102 103  CO2 26  --   GLUCOSE 88 82  BUN 9 11  CREATININE 0.77 0.80  CALCIUM 9.2  --     CBG: No results for input(s): GLUCAP in the last 168 hours.  GFR CrCl cannot be calculated (Unknown ideal weight.).  Coagulation profile  Recent Labs Lab 10/05/15 1349  INR 0.99    Cardiac Enzymes  Recent Labs Lab  10/05/15 2138 10/06/15 0700  TROPONINI <0.03 <0.03    Invalid input(s): POCBNP No results for input(s): DDIMER in the last 72 hours.  Recent Labs  10/05/15 1349  HGBA1C 5.7*    Recent Labs  10/05/15 1349  CHOL 235*  HDL 49  LDLCALC 142*  TRIG 222*  CHOLHDL 4.8    Recent Labs  10/05/15 2138  TSH 0.097*   No results for input(s): VITAMINB12, FOLATE, FERRITIN, TIBC, IRON, RETICCTPCT in the last 72 hours. No results for input(s): LIPASE, AMYLASE in the last 72 hours.  Urine Studies No results for input(s): UHGB, CRYS in the last 72 hours.  Invalid input(s): UACOL, UAPR, USPG, UPH, UTP, UGL, UKET, UBIL, UNIT, UROB, ULEU, UEPI, UWBC, URBC, UBAC, CAST, UCOM, BILUA  MICROBIOLOGY: No results found for this or any previous visit (from the past 240 hour(s)).  RADIOLOGY STUDIES/RESULTS: Ct Angio Head W/cm &/or Wo Cm  10/05/2015  CLINICAL DATA:  75 year old female with headache and visual changes for 5 days. Initial encounter. EXAM: CT ANGIOGRAPHY HEAD AND NECK TECHNIQUE: Multidetector CT imaging of the head and neck was performed using the standard protocol during bolus administration of intravenous contrast. Multiplanar CT image reconstructions and MIPs were obtained to evaluate the vascular anatomy. Carotid stenosis measurements (when applicable) are obtained utilizing NASCET criteria, using the distal internal carotid diameter as the denominator. CONTRAST:  161m OMNIPAQUE IOHEXOL 350 MG/ML SOLN COMPARISON:  CT head without contrast 1412 hours today. FINDINGS: CTA NECK Skeleton: Age congruent degenerative changes in the visible spine. Hyperostosis of the calvarium. Visualized paranasal sinuses and mastoids are clear. Incidental torus mandibularis. No acute osseous abnormality identified. Other neck: Negative lung apices. No superior mediastinal lymphadenopathy. Thyroid is diminutive or absent. Larynx, pharynx, parapharyngeal spaces, retropharyngeal space, sublingual space,  submandibular glands, parotid glands, and orbits soft tissues are within normal limits. No cervical lymphadenopathy. Aortic arch: Bovine arch configuration. Tortuous proximal CCAs. Minimal arch atherosclerosis. No great vessel origin stenosis. Right carotid system: Mild obscuration of the proximal right CCA due to dense right subclavian vein contrast. Otherwise negative. Widely patent right carotid bifurcation. Left carotid system: Bovine arch configuration with tortuous left carotid. Otherwise negative. Widely patent left carotid bifurcation. Vertebral arteries:No proximal subclavian artery stenosis. Normal vertebral artery origins. Tortuous V1 segments. Codominant vertebral artery is otherwise are negative to the skullbase. CTA HEAD Posterior circulation: Codominant distal vertebral arteries, but the left is diminutive beyond the PICA origin which appears normal. Dominant appearing right AICA. Patent vertebrobasilar junction. Diminutive basilar artery with bilateral fetal type PCA origins. SCA origins remain patent. No definite basilar stenosis. Bilateral PCA branches are within normal limits. Anterior circulation: Tortuous bilateral ICA  siphons with minimal calcified plaque. Dx no siphon stenosis. Normal ophthalmic and posterior communicating artery origins. Normal carotid termini, MCA and ACA origins. Anterior communicating artery and bilateral ACA branches are within normal limits. Left MCA M1 segment, bifurcation, and left MCA branches are within normal limits. Right MCA M1 segment, bifurcation, and right MCA branches are within normal limits. Venous sinuses: Patent. Dominant left transverse sinus, sigmoid sinus, and IJ bulb. The non dominant right side sinuses are diminutive. Anatomic variants: Fetal type bilateral PCA origins. Bovine arch configuration. Delayed phase: No abnormal enhancement identified. IMPRESSION: 1. Minimal for age atherosclerosis. Negative CTA head and neck aside from great vessel and  carotid tortuosity. 2. Stable and negative for age CT appearance of the brain. 3. Diminutive or absent thyroid (such as radioiodine treated). Electronically Signed   By: Genevie Ann M.D.   On: 10/05/2015 19:17   Dg Chest 2 View  10/05/2015  CLINICAL DATA:  Headache x 5 days, slurred speech and loss of vision x 1 day EXAM: CHEST  2 VIEW COMPARISON:  None. FINDINGS: The heart size and mediastinal contours are within normal limits. Both lungs are clear. Moderate mid thoracic spondylosis. IMPRESSION: No active cardiopulmonary disease. Electronically Signed   By: Nolon Nations M.D.   On: 10/05/2015 16:20   Ct Head Wo Contrast  10/05/2015  CLINICAL DATA:  Headache for the last 5 days.  Visual changes. EXAM: CT HEAD WITHOUT CONTRAST TECHNIQUE: Contiguous axial images were obtained from the base of the skull through the vertex without intravenous contrast. COMPARISON:  None. FINDINGS: Ventricles are normal in size and configuration. All areas of the brain demonstrate normal gray-white matter attenuation. There is no mass, hemorrhage, edema or other evidence of acute parenchymal abnormality. No extra-axial hemorrhage. No osseous abnormality. 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 Visualized upper paranasal sinuses are clear. Mastoid air cells are clear. Superficial soft tissues are unremarkable. Orbital/periorbital structures are unremarkable. IMPRESSION: Negative head CT.  No intracranial mass, hemorrhage or edema. Electronically Signed   By: Franki Cabot M.D.   On: 10/05/2015 14:23   Ct Angio Neck W/cm &/or Wo/cm  10/05/2015  CLINICAL DATA:  75 year old female with headache and visual changes for 5 days. Initial encounter. EXAM: CT ANGIOGRAPHY HEAD AND NECK TECHNIQUE: Multidetector CT imaging of the head and neck was performed using the standard protocol during bolus administration of intravenous contrast. Multiplanar CT image reconstructions and MIPs were  obtained to evaluate the vascular anatomy. Carotid stenosis measurements (when applicable) are obtained utilizing NASCET criteria, using the distal internal carotid diameter as the denominator. CONTRAST:  143m OMNIPAQUE IOHEXOL 350 MG/ML SOLN COMPARISON:  CT head without contrast 1412 hours today. FINDINGS: CTA NECK Skeleton: Age congruent degenerative changes in the visible spine. Hyperostosis of the calvarium. Visualized paranasal sinuses and mastoids are clear. Incidental torus mandibularis. No acute osseous abnormality identified. Other neck: Negative lung apices. No superior mediastinal lymphadenopathy. Thyroid is diminutive or absent. Larynx, pharynx, parapharyngeal spaces, retropharyngeal space, sublingual space, submandibular glands, parotid glands, and orbits soft tissues are within normal limits. No cervical lymphadenopathy. Aortic arch: Bovine arch configuration. Tortuous proximal CCAs. Minimal arch atherosclerosis. No great vessel origin stenosis. Right carotid system: Mild obscuration of the proximal right CCA due to dense right subclavian vein contrast. Otherwise negative. Widely patent right carotid bifurcation. Left carotid system: Bovine arch configuration with tortuous left carotid. Otherwise negative. Widely patent left carotid bifurcation. Vertebral arteries:No proximal subclavian artery stenosis. Normal vertebral artery  origins. Tortuous V1 segments. Codominant vertebral artery is otherwise are negative to the skullbase. CTA HEAD Posterior circulation: Codominant distal vertebral arteries, but the left is diminutive beyond the PICA origin which appears normal. Dominant appearing right AICA. Patent vertebrobasilar junction. Diminutive basilar artery with bilateral fetal type PCA origins. SCA origins remain patent. No definite basilar stenosis. Bilateral PCA branches are within normal limits. Anterior circulation: Tortuous bilateral ICA siphons with minimal calcified plaque. Dx no siphon  stenosis. Normal ophthalmic and posterior communicating artery origins. Normal carotid termini, MCA and ACA origins. Anterior communicating artery and bilateral ACA branches are within normal limits. Left MCA M1 segment, bifurcation, and left MCA branches are within normal limits. Right MCA M1 segment, bifurcation, and right MCA branches are within normal limits. Venous sinuses: Patent. Dominant left transverse sinus, sigmoid sinus, and IJ bulb. The non dominant right side sinuses are diminutive. Anatomic variants: Fetal type bilateral PCA origins. Bovine arch configuration. Delayed phase: No abnormal enhancement identified. IMPRESSION: 1. Minimal for age atherosclerosis. Negative CTA head and neck aside from great vessel and carotid tortuosity. 2. Stable and negative for age CT appearance of the brain. 3. Diminutive or absent thyroid (such as radioiodine treated). Electronically Signed   By: Genevie Ann M.D.   On: 10/05/2015 19:17   Mr Brain Wo Contrast  10/05/2015  CLINICAL DATA:  Initial evaluation for transient right-sided visual disturbance and aphasia. EXAM: MRI HEAD WITHOUT CONTRAST TECHNIQUE: Multiplanar, multiecho pulse sequences of the brain and surrounding structures were obtained without intravenous contrast. COMPARISON:  Prior studies performed earlier the same day. FINDINGS: Age-related cerebral volume loss present. No significant white matter disease for patient age. No abnormal foci of restricted diffusion to suggest acute intracranial infarct. Gray-white matter differentiation maintained. Normal intracranial vascular flow voids are preserved. No acute or chronic intracranial hemorrhage. No areas of chronic infarction. No mass lesion, midline shift, or mass effect. No hydrocephalus. No extra-axial fluid collection. Craniocervical junction within normal limits. Pituitary gland normal. No acute abnormality about the orbits. Sequela prior bilateral lens extraction noted. Mild mucosal thickening within  the ethmoidal air cells and maxillary sinuses. No air-fluid levels to suggest active sinus infection. Minimal scattered opacity within the bilateral mastoid air cells. Inner ear structures normal. Bone marrow signal intensity within normal limits. No scalp soft tissue abnormality. IMPRESSION: Normal brain MRI for patient age. No acute intracranial infarct or other process identified. Electronically Signed   By: Jeannine Boga M.D.   On: 10/05/2015 22:41    Oren Binet, MD  [facility] Pager:[phone]  If 7PM-7AM, please contact night-coverage [email or web address] Password [email or web address] 10/06/2015, 1:34 PM

## 2015-10-06 NOTE — Progress Notes (Addendum)
STROKE TEAM PROGRESS NOTE   HISTORY OF PRESENT ILLNESS Julie [name] is an 75 y.o. female who was fine this AM 10/05/2015 until about 1100. She suddenly noted she had diplopia on her right visual field. She "could see the same image in front of her and it was reflected to her right visual field. When she closed her left eye the double vision persisted but when she closed her right eye is resolved." the diplopia resolved in 30 minutes but then she noted she had expressive aphasia for about 15 minutes." No there symptoms. She has had elevated BP for about 5 days and HA. currently she has low grade frontal HA but no other symptoms. Patient was not administered TPA secondary to resolved symptoms. She was admitted for further evaluation and treatment.   SUBJECTIVE (INTERVAL HISTORY) Her neighbor and PT neighbor Julie [name] is at the bedside.  She reports recent strep throat, HA associated with it for 5 days. Had right eye seeing mirror image at the lateral visual field. Left eye no problem. Resolved in less than 80mn. Then started to have word finding difficulty lasting less than 30 min. Since then the HA is gone. No hx migraines. No hx of vision changes prior to yesterday. No family HA hx. Overall she feels her condition is completely resolved.   OBJECTIVE Temp:  [97.7 F (36.5 C)-98 F (36.7 C)] 98 F (36.7 C) (01/31 1008) Pulse Rate:  [51-70] 60 (01/31 1008) Cardiac Rhythm:  [-] Sinus bradycardia (01/31 0734) Resp:  [12-18] 18 (01/31 1008) BP: (108-166)/(55-80) 149/56 mmHg (01/31 1008) SpO2:  [98 %-100 %] 98 % (01/31 1008)  CBC:   Recent Labs Lab 10/05/15 1349 10/05/15 1437 10/05/15 2344  WBC 5.0  --  7.2  NEUTROABS 2.7  --   --   HGB 15.5* 16.7* 14.1  HCT 46.1* 49.0* 42.4  MCV 88.8  --  88.5  PLT 336  --  3671   Basic Metabolic Panel:   Recent Labs Lab 10/05/15 1349 10/05/15 1437  NA 142 142  K 4.5 4.3  CL 102 103  CO2 26  --   GLUCOSE 88 82  BUN 9 11  CREATININE 0.77 0.80   CALCIUM 9.2  --     Lipid Panel:     Component Value Date/Time   CHOL 235* 10/05/2015 1349   TRIG 222* 10/05/2015 1349   HDL 49 10/05/2015 1349   CHOLHDL 4.8 10/05/2015 1349   VLDL 44* 10/05/2015 1349   LDLCALC 142* 10/05/2015 1349   HgbA1c:  Lab Results  Component Value Date   HGBA1C 5.7* 10/05/2015   Urine Drug Screen: No results found for: LABOPIA, COCAINSCRNUR, LABBENZ, AMPHETMU, THCU, LABBARB    IMAGING I have personally reviewed the radiological images below and agree with the radiology interpretations.  Dg Chest 2 View 10/05/2015    No active cardiopulmonary disease.   Ct Head Wo Contrast 10/05/2015    Negative head CT.  No intracranial mass, hemorrhage or edema.   Ct Angio Head & Neck W/cm &/or Wo/cm 10/05/2015  1. Minimal for age atherosclerosis. Negative CTA head and neck aside from great vessel and carotid tortuosity. 2. Stable and negative for age CT appearance of the brain. 3. Diminutive or absent thyroid (such as radioiodine treated).   Mr Brain Wo Contrast 10/05/2015    Normal brain MRI for patient age. No acute intracranial infarct or other process identified.   2D echo - pending  EEG - pending   Physical exam  Temp:  [97.7 F (36.5 C)-98 F (36.7 C)] 97.7 F (36.5 C) (01/31 1444) Pulse Rate:  [52-70] 52 (01/31 1444) Resp:  [16-18] 18 (01/31 1444) BP: (108-155)/(55-61) 122/55 mmHg (01/31 1444) SpO2:  [98 %-100 %] 98 % (01/31 1444) Weight:  [151 lb 11.2 oz (68.811 kg)] 151 lb 11.2 oz (68.811 kg) (01/31 1700)  General - Well nourished, well developed, in no apparent distress.  Ophthalmologic - Fundi not visualized due to small pupils.  Cardiovascular - Regular rate and rhythm with no murmur.  Mental Status -  Level of arousal and orientation to time, place, and person were intact. Language including expression, naming, repetition, comprehension was assessed and found intact. Fund of Knowledge was assessed and was intact.  Cranial Nerves  II - XII - II - Visual field intact OU. III, IV, VI - Extraocular movements intact. V - Facial sensation intact bilaterally. VII - Facial movement intact bilaterally. VIII - Hearing & vestibular intact bilaterally. X - Palate elevates symmetrically. XI - Chin turning & shoulder shrug intact bilaterally. XII - Tongue protrusion intact.  Motor Strength - The patient's strength was normal in all extremities and pronator drift was absent.  Bulk was normal and fasciculations were absent.   Motor Tone - Muscle tone was assessed at the neck and appendages and was normal.  Reflexes - The patient's reflexes were 1+ in all extremities and she had no pathological reflexes.  Sensory - Light touch, temperature/pinprick were assessed and were symmetrical.    Coordination - The patient had normal movements in the hands and feet with no ataxia or dysmetria.  Tremor was absent.  Gait and Station - deferred due to safety concerns.   ASSESSMENT/PLAN Julie [name] is a 75 y.o. female with history of thyroid dysfunction, celiac disease presenting with right diplopia. She did not receive IV t-PA due to resolve symptoms.   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 TIA and seizure less likely.  Resultant  Neuro symptoms resolved  CTA head and neck Unremarkable   MRI  No acute stroke  2D Echo  pending   LDL 142  Check EEG to rule out seizure  HgbA1c 5.7  Lovenox 30 mg sq daily for VTE prophylaxis Diet gluten free Room service appropriate?: Yes; Fluid consistency:: Thin  No antithrombotic prior to admission, now on aspirin 325 mg daily. Continue ASA on discharge.  Ongoing aggressive stroke risk factor management  Therapy recommendations:  No therapy needs  Disposition:  Return home  Over treatment of hypothyroidism   Pt on supplement of levothyroxine  TSH low and FT4 was high  Need decreased levo dose  Will defer to  primary team  Hyperlipidemia  Home meds:  No statin  LDL 142  Has tried statins x 2 in the past, muscle aches with that  New lipitor 10 mg started this admission. Will assess for toleratnce  If does not tolerate, can evaluate for PCSK9 as OP  Continue statin at discharge  Other Stroke Risk Factors  Advanced age  Other Active Problems  Thyroid disease  Celiac disease  Hospital day # 1  Rosalin Hawking, MD PhD Stroke Neurology 10/06/2015 6:22 PM   To contact Stroke Continuity provider, please refer to [email or web address] After hours, contact General Neurology

## 2015-10-06 NOTE — Procedures (Signed)
History: 75 year old female with transient aphasia  Sedation: None  Technique: This is a 21 channel routine scalp EEG performed at the bedside with bipolar and monopolar montages arranged in accordance to the international 10/20 system of electrode placement. One channel was dedicated to EKG recording.    Background: The background consists of intermixed alpha and beta activities. There is a well defined posterior dominant rhythm of 9 Hz that attenuates with eye opening. Sleep is recorded with normal appearing structures.   Photic stimulation: Physiologic driving is not performed  EEG Abnormalities: None  Clinical Interpretation: This normal EEG is recorded in the waking and sleep state. There was no seizure or seizure predisposition recorded on this study. Please note that a normal EEG does not preclude the possibility of epilepsy.   Roland Rack, MD [facility] [phone]  If 7pm- 7am, please page neurology on call as listed in [place].

## 2015-10-06 NOTE — Progress Notes (Signed)
CM met with the patient to address no PCP listed. Patient states she sees Dr. Bluford Main at [facility].

## 2015-10-06 NOTE — Progress Notes (Signed)
OT Cancellation Note  Patient Details Name: Julie [name] MRN: [id number] DOB: 1941-04-06   Cancelled Treatment:    Reason Eval/Treat Not Completed: OT screened, no needs identified, will sign off  Benito Mccreedy OTR/L [phone] 10/06/2015, 10:58 AM

## 2015-10-06 NOTE — Care Management Note (Signed)
Case Management Note  Patient Details  Name: Julie [name] MRN: [id number] Date of Birth: 10-Jan-1941  Subjective/Objective:                    Action/Plan: Patient was admitted blurred vision, headache and expressive aphasia. TIA work-up underway.  Lives at home alone. Will follow for discharge needs pending PT/OT evals and physician orders. Expected Discharge Date:                  Expected Discharge Plan:     In-House Referral:     Discharge planning Services     Post Acute Care Choice:    Choice offered to:     DME Arranged:    DME Agency:     HH Arranged:    HH Agency:     Status of Service:  In process, will continue to follow  Medicare Important Message Given:    Date Medicare IM Given:    Medicare IM give by:    Date Additional Medicare IM Given:    Additional Medicare Important Message give by:     If discussed at [place] of Stay Meetings, dates discussed:    Additional Comments:  Rolm Baptise, RN 10/06/2015, 10:30 AM [phone]

## 2015-10-07 DIAGNOSIS — E079 Disorder of thyroid, unspecified: Secondary | ICD-10-CM | POA: Diagnosis not present

## 2015-10-07 DIAGNOSIS — D751 Secondary polycythemia: Secondary | ICD-10-CM | POA: Diagnosis not present

## 2015-10-07 DIAGNOSIS — K900 Celiac disease: Secondary | ICD-10-CM | POA: Diagnosis not present

## 2015-10-07 DIAGNOSIS — G43909 Migraine, unspecified, not intractable, without status migrainosus: Secondary | ICD-10-CM | POA: Diagnosis not present

## 2015-10-07 MED ORDER — ASPIRIN 325 MG PO TABS: 325.0000 mg | ORAL_TABLET | Freq: Every day | ORAL | Status: DC

## 2015-10-07 MED ORDER — LEVOTHYROXINE SODIUM 125 MCG PO TABS: 125.0000 ug | ORAL_TABLET | ORAL | Status: DC

## 2015-10-07 MED ORDER — ATORVASTATIN CALCIUM 10 MG PO TABS: 10.0000 mg | ORAL_TABLET | Freq: Every day | ORAL | Status: DC

## 2015-10-07 NOTE — Discharge Summary (Signed)
PATIENT DETAILS Name: Julie [name] Age: 75 y.o. Sex: female Date of Birth: Aug 31, 1941 MRN: [id number]. Admitting Physician: Waldemar Dickens, MD ELF:[id number], Nicki Reaper, MD  Admit Date: 10/05/2015 Discharge date: 10/07/2015  Recommendations for Outpatient Follow-up:  1. Please repeat lipid panel in 3 months 2. Note-TSH suppressed-levothyroxine decreased to 125 mcg-please repeat TSH in 3 months  3. If does not tolerate statins- will require PCSK9 inhibitors as outpatient 4. Very mild polycythemia-continue outpatient monitoring.   PRIMARY DISCHARGE DIAGNOSIS:  Principal Problem:   TIA (transient ischemic attack) Active Problems:   Thyroid disease   Polycythemia   Diplopia   Celiac disease      PAST MEDICAL HISTORY: Past Medical History  Diagnosis Date  . Thyroid disease   . Celiac disease   . TIA (transient ischemic attack)     2017  . Polycythemia   . Hypertension   . Asthma   . Cancer ([place])     DISCHARGE MEDICATIONS: Current Discharge Medication List    START taking these medications   Details  aspirin 325 MG tablet Take 1 tablet (325 mg total) by mouth daily. Qty: 30 tablet, Refills: 0    atorvastatin (LIPITOR) 10 MG tablet Take 1 tablet (10 mg total) by mouth daily at 6 PM. Qty: 30 tablet, Refills: 0      CONTINUE these medications which have CHANGED   Details  levothyroxine (SYNTHROID, LEVOTHROID) 125 MCG tablet Take 1 tablet (125 mcg total) by mouth every other day. Qty: 30 tablet, Refills: 0      CONTINUE these medications which have NOT CHANGED   Details  beclomethasone (QVAR) 80 MCG/ACT inhaler Inhale 2 puffs into the lungs 2 (two) times daily.    Cyanocobalamin (VITAMIN B-12 IJ) Inject 1,000 mg as directed every 30 (thirty) days.    fluticasone (FLONASE) 50 MCG/ACT nasal spray Place 1 spray into both nostrils daily.    Multiple Vitamins-Minerals (MULTIVITAMIN WITH MINERALS) tablet Take 1 tablet by mouth daily.    Probiotic Product (PRO-BIOTIC  BLEND PO) Take 1 tablet by mouth daily.      STOP taking these medications     amoxicillin-clavulanate (AUGMENTIN) 875-125 MG tablet         ALLERGIES:   Allergies  Allergen Reactions  . Sulfa Antibiotics Swelling  . Demerol [Meperidine] Rash  . Morphine And Related Rash    BRIEF HPI:  See H&P, Labs, Consult and Test reports for all details in brief, patient was admitted for evaluation of headache and subsequent dysarthria and diplopia.  CONSULTATIONS:   neurology  PERTINENT RADIOLOGIC STUDIES: Ct Angio Head W/cm &/or Wo Cm  10/05/2015  CLINICAL DATA:  75 year old female with headache and visual changes for 5 days. Initial encounter. EXAM: CT ANGIOGRAPHY HEAD AND NECK TECHNIQUE: Multidetector CT imaging of the head and neck was performed using the standard protocol during bolus administration of intravenous contrast. Multiplanar CT image reconstructions and MIPs were obtained to evaluate the vascular anatomy. Carotid stenosis measurements (when applicable) are obtained utilizing NASCET criteria, using the distal internal carotid diameter as the denominator. CONTRAST:  168m OMNIPAQUE IOHEXOL 350 MG/ML SOLN COMPARISON:  CT head without contrast 1412 hours today. FINDINGS: CTA NECK Skeleton: Age congruent degenerative changes in the visible spine. Hyperostosis of the calvarium. Visualized paranasal sinuses and mastoids are clear. Incidental torus mandibularis. No acute osseous abnormality identified. Other neck: Negative lung apices. No superior mediastinal lymphadenopathy. Thyroid is diminutive or absent. Larynx, pharynx, parapharyngeal spaces, retropharyngeal space, sublingual space, submandibular glands, parotid glands,  and orbits soft tissues are within normal limits. No cervical lymphadenopathy. Aortic arch: Bovine arch configuration. Tortuous proximal CCAs. Minimal arch atherosclerosis. No great vessel origin stenosis. Right carotid system: Mild obscuration of the proximal right CCA  due to dense right subclavian vein contrast. Otherwise negative. Widely patent right carotid bifurcation. Left carotid system: Bovine arch configuration with tortuous left carotid. Otherwise negative. Widely patent left carotid bifurcation. Vertebral arteries:No proximal subclavian artery stenosis. Normal vertebral artery origins. Tortuous V1 segments. Codominant vertebral artery is otherwise are negative to the skullbase. CTA HEAD Posterior circulation: Codominant distal vertebral arteries, but the left is diminutive beyond the PICA origin which appears normal. Dominant appearing right AICA. Patent vertebrobasilar junction. Diminutive basilar artery with bilateral fetal type PCA origins. SCA origins remain patent. No definite basilar stenosis. Bilateral PCA branches are within normal limits. Anterior circulation: Tortuous bilateral ICA siphons with minimal calcified plaque. Dx no siphon stenosis. Normal ophthalmic and posterior communicating artery origins. Normal carotid termini, MCA and ACA origins. Anterior communicating artery and bilateral ACA branches are within normal limits. Left MCA M1 segment, bifurcation, and left MCA branches are within normal limits. Right MCA M1 segment, bifurcation, and right MCA branches are within normal limits. Venous sinuses: Patent. Dominant left transverse sinus, sigmoid sinus, and IJ bulb. The non dominant right side sinuses are diminutive. Anatomic variants: Fetal type bilateral PCA origins. Bovine arch configuration. Delayed phase: No abnormal enhancement identified. IMPRESSION: 1. Minimal for age atherosclerosis. Negative CTA head and neck aside from great vessel and carotid tortuosity. 2. Stable and negative for age CT appearance of the brain. 3. Diminutive or absent thyroid (such as radioiodine treated). Electronically Signed   By: Genevie Ann M.D.   On: 10/05/2015 19:17   Dg Chest 2 View  10/05/2015  CLINICAL DATA:  Headache x 5 days, slurred speech and loss of vision x 1  day EXAM: CHEST  2 VIEW COMPARISON:  None. FINDINGS: The heart size and mediastinal contours are within normal limits. Both lungs are clear. Moderate mid thoracic spondylosis. IMPRESSION: No active cardiopulmonary disease. Electronically Signed   By: Nolon Nations M.D.   On: 10/05/2015 16:20   Ct Head Wo Contrast  10/05/2015  CLINICAL DATA:  Headache for the last 5 days.  Visual changes. EXAM: CT HEAD WITHOUT CONTRAST TECHNIQUE: Contiguous axial images were obtained from the base of the skull through the vertex without intravenous contrast. COMPARISON:  None. FINDINGS: Ventricles are normal in size and configuration. All areas of the brain demonstrate normal gray-white matter attenuation. There is no mass, hemorrhage, edema or other evidence of acute parenchymal abnormality. No extra-axial hemorrhage. No osseous abnormality. 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 Visualized upper paranasal sinuses are clear. Mastoid air cells are clear. Superficial soft tissues are unremarkable. Orbital/periorbital structures are unremarkable. IMPRESSION: Negative head CT.  No intracranial mass, hemorrhage or edema. Electronically Signed   By: Franki Cabot M.D.   On: 10/05/2015 14:23   Ct Angio Neck W/cm &/or Wo/cm  10/05/2015  CLINICAL DATA:  75 year old female with headache and visual changes for 5 days. Initial encounter. EXAM: CT ANGIOGRAPHY HEAD AND NECK TECHNIQUE: Multidetector CT imaging of the head and neck was performed using the standard protocol during bolus administration of intravenous contrast. Multiplanar CT image reconstructions and MIPs were obtained to evaluate the vascular anatomy. Carotid stenosis measurements (when applicable) are obtained utilizing NASCET criteria, using the distal internal carotid diameter as the denominator. CONTRAST:  175m OMNIPAQUE  IOHEXOL 350 MG/ML SOLN COMPARISON:  CT head without contrast 1412 hours today. FINDINGS: CTA NECK  Skeleton: Age congruent degenerative changes in the visible spine. Hyperostosis of the calvarium. Visualized paranasal sinuses and mastoids are clear. Incidental torus mandibularis. No acute osseous abnormality identified. Other neck: Negative lung apices. No superior mediastinal lymphadenopathy. Thyroid is diminutive or absent. Larynx, pharynx, parapharyngeal spaces, retropharyngeal space, sublingual space, submandibular glands, parotid glands, and orbits soft tissues are within normal limits. No cervical lymphadenopathy. Aortic arch: Bovine arch configuration. Tortuous proximal CCAs. Minimal arch atherosclerosis. No great vessel origin stenosis. Right carotid system: Mild obscuration of the proximal right CCA due to dense right subclavian vein contrast. Otherwise negative. Widely patent right carotid bifurcation. Left carotid system: Bovine arch configuration with tortuous left carotid. Otherwise negative. Widely patent left carotid bifurcation. Vertebral arteries:No proximal subclavian artery stenosis. Normal vertebral artery origins. Tortuous V1 segments. Codominant vertebral artery is otherwise are negative to the skullbase. CTA HEAD Posterior circulation: Codominant distal vertebral arteries, but the left is diminutive beyond the PICA origin which appears normal. Dominant appearing right AICA. Patent vertebrobasilar junction. Diminutive basilar artery with bilateral fetal type PCA origins. SCA origins remain patent. No definite basilar stenosis. Bilateral PCA branches are within normal limits. Anterior circulation: Tortuous bilateral ICA siphons with minimal calcified plaque. Dx no siphon stenosis. Normal ophthalmic and posterior communicating artery origins. Normal carotid termini, MCA and ACA origins. Anterior communicating artery and bilateral ACA branches are within normal limits. Left MCA M1 segment, bifurcation, and left MCA branches are within normal limits. Right MCA M1 segment, bifurcation, and right  MCA branches are within normal limits. Venous sinuses: Patent. Dominant left transverse sinus, sigmoid sinus, and IJ bulb. The non dominant right side sinuses are diminutive. Anatomic variants: Fetal type bilateral PCA origins. Bovine arch configuration. Delayed phase: No abnormal enhancement identified. IMPRESSION: 1. Minimal for age atherosclerosis. Negative CTA head and neck aside from great vessel and carotid tortuosity. 2. Stable and negative for age CT appearance of the brain. 3. Diminutive or absent thyroid (such as radioiodine treated). Electronically Signed   By: Genevie Ann M.D.   On: 10/05/2015 19:17   Mr Brain Wo Contrast  10/05/2015  CLINICAL DATA:  Initial evaluation for transient right-sided visual disturbance and aphasia. EXAM: MRI HEAD WITHOUT CONTRAST TECHNIQUE: Multiplanar, multiecho pulse sequences of the brain and surrounding structures were obtained without intravenous contrast. COMPARISON:  Prior studies performed earlier the same day. FINDINGS: Age-related cerebral volume loss present. No significant white matter disease for patient age. No abnormal foci of restricted diffusion to suggest acute intracranial infarct. Gray-white matter differentiation maintained. Normal intracranial vascular flow voids are preserved. No acute or chronic intracranial hemorrhage. No areas of chronic infarction. No mass lesion, midline shift, or mass effect. No hydrocephalus. No extra-axial fluid collection. Craniocervical junction within normal limits. Pituitary gland normal. No acute abnormality about the orbits. Sequela prior bilateral lens extraction noted. Mild mucosal thickening within the ethmoidal air cells and maxillary sinuses. No air-fluid levels to suggest active sinus infection. Minimal scattered opacity within the bilateral mastoid air cells. Inner ear structures normal. Bone marrow signal intensity within normal limits. No scalp soft tissue abnormality. IMPRESSION: Normal brain MRI for patient age.  No acute intracranial infarct or other process identified. Electronically Signed   By: Jeannine Boga M.D.   On: 10/05/2015 22:41     PERTINENT LAB RESULTS: CBC:  Recent Labs  10/05/15 1349 10/05/15 1437 10/05/15 2344  WBC 5.0  --  7.2  HGB  15.5* 16.7* 14.1  HCT 46.1* 49.0* 42.4  PLT 336  --  323   CMET CMP     Component Value Date/Time   NA 142 10/05/2015 1437   K 4.3 10/05/2015 1437   CL 103 10/05/2015 1437   CO2 26 10/05/2015 1349   GLUCOSE 82 10/05/2015 1437   BUN 11 10/05/2015 1437   CREATININE 0.80 10/05/2015 1437   CALCIUM 9.2 10/05/2015 1349   PROT 6.5 10/05/2015 1349   ALBUMIN 3.6 10/05/2015 1349   AST 23 10/05/2015 1349   ALT 23 10/05/2015 1349   ALKPHOS 66 10/05/2015 1349   BILITOT 0.5 10/05/2015 1349   GFRNONAA >60 10/05/2015 1349   GFRAA >60 10/05/2015 1349    GFR Estimated Creatinine Clearance: 61 mL/min (by C-G formula based on Cr of 0.8). No results for input(s): LIPASE, AMYLASE in the last 72 hours.  Recent Labs  10/05/15 2138 10/06/15 0700  TROPONINI <0.03 <0.03   Invalid input(s): POCBNP No results for input(s): DDIMER in the last 72 hours.  Recent Labs  10/05/15 1349  HGBA1C 5.7*    Recent Labs  10/05/15 1349  CHOL 235*  HDL 49  LDLCALC 142*  TRIG 222*  CHOLHDL 4.8    Recent Labs  10/06/15 1238  TSH 0.044*   No results for input(s): VITAMINB12, FOLATE, FERRITIN, TIBC, IRON, RETICCTPCT in the last 72 hours. Coags:  Recent Labs  10/05/15 1349  INR 0.99   Microbiology: No results found for this or any previous visit (from the past 240 hour(s)).   BRIEF HOSPITAL COURSE:  TIA (transient ischemic attack) vs migraine with aura: No further headaches, dysarthria/diplopia have completely resolved. No focal deficits on exam. MRI brain and negative for acute CVA, CTA neck negative for major stenosis. Echocardiogram does not show any embolic source (per Dr. Marlou Porch). LDL 142, A1c 5.7. EEG negative.Continue aspirin,  trial of statins (intolerant to statins in the past). No further recommendations from neurology.  Active Problems: Dyslipidemia: LDL 142-has been intolerant to statins in the past-trial of 10 mg Lipitor to assess tolerance. If does not tolerate, will require PCSK9 inhibitors as outpatient  Hypothyroidism: TSH suppressed-takes 200 mcg and 175 mcg on alternate days. Will decrease 125 mcg daily-repeat TSH in 3 months.  History of celiac disease: Controlled with gluten-free diet. No further diarrhea  Polycythemia: Very mild-continue aspirin-suspect further monitoring could be done in the outpatient setting.  TODAY-DAY OF DISCHARGE:  Subjective:   Julie [name] today has no headache,no chest abdominal pain,no new weakness tingling or numbness, feels much better wants to go home today.   Objective:   Blood pressure 115/67, pulse 54, temperature 97.9 F (36.6 C), temperature source Oral, resp. rate 16, height 5' 5"  (1.651 m), weight 68.811 kg (151 lb 11.2 oz), SpO2 95 %.  Intake/Output Summary (Last 24 hours) at 10/07/15 1131 Last data filed at 10/07/15 1004  Gross per 24 hour  Intake    240 ml  Output   1800 ml  Net  -1560 ml   Filed Weights   10/06/15 1700  Weight: 68.811 kg (151 lb 11.2 oz)    Exam Awake Alert, Oriented *3, No new F.N deficits, Normal affect [place].AT,PERRAL Supple Neck,No JVD, No cervical lymphadenopathy appriciated.  Symmetrical Chest wall movement, Good air movement bilaterally, CTAB RRR,No Gallops,Rubs or new Murmurs, No Parasternal Heave +ve B.Sounds, Abd Soft, Non tender, No organomegaly appriciated, No rebound -guarding or rigidity. No Cyanosis, Clubbing or edema, No new Rash or bruise  DISCHARGE CONDITION: Stable  DISPOSITION: Home  DISCHARGE INSTRUCTIONS:    Activity:  As tolerated   Get Medicines reviewed and adjusted: Please take all your medications with you for your next visit with your Primary MD  Please request your Primary MD to go over  all hospital tests and procedure/radiological results at the follow up, please ask your Primary MD to get all Hospital records sent to his/her office.  If you experience worsening of your admission symptoms, develop shortness of breath, life threatening emergency, suicidal or homicidal thoughts you must seek medical attention immediately by calling 911 or calling your MD immediately  if symptoms less severe.  You must read complete instructions/literature along with all the possible adverse reactions/side effects for all the Medicines you take and that have been prescribed to you. Take any new Medicines after you have completely understood and accpet all the possible adverse reactions/side effects.   Do not drive when taking Pain medications.   Do not take more than prescribed Pain, Sleep and Anxiety Medications  Special Instructions: If you have smoked or chewed Tobacco  in the last 2 yrs please stop smoking, stop any regular Alcohol  and or any Recreational drug use.  Wear Seat belts while driving.  Please note  You were cared for by a hospitalist during your hospital stay. Once you are discharged, your primary care physician will handle any further medical issues. Please note that NO REFILLS for any discharge medications will be authorized once you are discharged, as it is imperative that you return to your primary care physician (or establish a relationship with a primary care physician if you do not have one) for your aftercare needs so that they can reassess your need for medications and monitor your lab values.  Diet recommendation: Heart Healthy diet  Discharge Instructions    Diet - low sodium heart healthy    Complete by:  As directed      Increase activity slowly    Complete by:  As directed            Follow-up Information    Follow up with Velna Hatchet, MD. Schedule an appointment as soon as possible for a visit in 1 week.   Specialty:  Internal Medicine   Why:   Hospital follow up   Contact information:   [address] [place] [address] [phone]      Total Time spent on discharge equals 25  minutes.  SignedOren Binet 10/07/2015 11:31 AM

## 2015-10-07 NOTE — Care Management Note (Signed)
Case Management Note  Patient Details  Name: Julie [name] MRN: [id number] Date of Birth: 1941/03/21  Subjective/Objective:                    Action/Plan: Patient discharging to home with self care. No further needs per CM.   Expected Discharge Date:                  Expected Discharge Plan:  Home/Self Care  In-House Referral:     Discharge planning Services     Post Acute Care Choice:    Choice offered to:     DME Arranged:    DME Agency:     HH Arranged:    HH Agency:     Status of Service:  In process, will continue to follow  Medicare Important Message Given:    Date Medicare IM Given:    Medicare IM give by:    Date Additional Medicare IM Given:    Additional Medicare Important Message give by:     If discussed at [place] of Stay Meetings, dates discussed:    Additional Comments:  Pollie Friar, RN 10/07/2015, 1:50 PM

## 2015-10-07 NOTE — Progress Notes (Signed)
STROKE TEAM PROGRESS NOTE   SUBJECTIVE (INTERVAL HISTORY) Friend at bedside. Pt anxious to go home. Still waiting on 2D echo. Feel symptoms are HA related. Planned trip to [facility] for golfing soon and goes to aerobics 3 x week. Planned cruise in April. Discussed thyroid studies and current treatment.    OBJECTIVE Temp:  [97.5 F (36.4 C)-97.9 F (36.6 C)] 97.9 F (36.6 C) (02/01 1004) Pulse Rate:  [52-89] 54 (02/01 1004) Cardiac Rhythm:  [-] Sinus bradycardia (02/01 0742) Resp:  [16-19] 16 (02/01 1004) BP: (115-133)/(55-72) 115/67 mmHg (02/01 1004) SpO2:  [95 %-99 %] 95 % (02/01 1004) Weight:  [68.811 kg (151 lb 11.2 oz)] 68.811 kg (151 lb 11.2 oz) (01/31 1700)  CBC:   Recent Labs Lab 10/05/15 1349 10/05/15 1437 10/05/15 2344  WBC 5.0  --  7.2  NEUTROABS 2.7  --   --   HGB 15.5* 16.7* 14.1  HCT 46.1* 49.0* 42.4  MCV 88.8  --  88.5  PLT 336  --  583    Basic Metabolic Panel:   Recent Labs Lab 10/05/15 1349 10/05/15 1437  NA 142 142  K 4.5 4.3  CL 102 103  CO2 26  --   GLUCOSE 88 82  BUN 9 11  CREATININE 0.77 0.80  CALCIUM 9.2  --     Lipid Panel:     Component Value Date/Time   CHOL 235* 10/05/2015 1349   TRIG 222* 10/05/2015 1349   HDL 49 10/05/2015 1349   CHOLHDL 4.8 10/05/2015 1349   VLDL 44* 10/05/2015 1349   LDLCALC 142* 10/05/2015 1349   HgbA1c:  Lab Results  Component Value Date   HGBA1C 5.7* 10/05/2015   Urine Drug Screen: No results found for: LABOPIA, COCAINSCRNUR, LABBENZ, AMPHETMU, THCU, LABBARB    IMAGING I have personally reviewed the radiological images below and agree with the radiology interpretations.  Dg Chest 2 View 10/05/2015    No active cardiopulmonary disease.   Ct Head Wo Contrast 10/05/2015    Negative head CT.  No intracranial mass, hemorrhage or edema.   Ct Angio Head & Neck W/cm &/or Wo/cm 10/05/2015  1. Minimal for age atherosclerosis. Negative CTA head and neck aside from great vessel and carotid tortuosity. 2.  Stable and negative for age CT appearance of the brain. 3. Diminutive or absent thyroid (such as radioiodine treated).   Mr Brain Wo Contrast 10/05/2015    Normal brain MRI for patient age. No acute intracranial infarct or other process identified.   2D echo  - Left ventricle: The cavity size was normal. Wall thickness was normal. Systolic function was normal. The estimated ejection fraction was in the range of 55% to 60%. Wall motion was normal; there were no regional wall motion abnormalities. There was no evidence of elevated ventricular filling pressure by Doppler parameters. - Mitral valve: There was mild regurgitation. - Left atrium: The atrium was mildly dilated. Impressions: - No cardiac source of emboli was indentified.  EEG  normal   Physical exam  Temp:  [97.5 F (36.4 C)-97.9 F (36.6 C)] 97.9 F (36.6 C) (02/01 1004) Pulse Rate:  [52-89] 54 (02/01 1004) Resp:  [16-19] 16 (02/01 1004) BP: (115-133)/(55-72) 115/67 mmHg (02/01 1004) SpO2:  [95 %-99 %] 95 % (02/01 1004) Weight:  [68.811 kg (151 lb 11.2 oz)] 68.811 kg (151 lb 11.2 oz) (01/31 1700)  General - Well nourished, well developed, in no apparent distress.  Ophthalmologic - Fundi not visualized due to small pupils.  Cardiovascular -  Regular rate and rhythm with no murmur.  Mental Status -  Level of arousal and orientation to time, place, and person were intact. Language including expression, naming, repetition, comprehension was assessed and found intact. Fund of Knowledge was assessed and was intact.  Cranial Nerves II - XII - II - Visual field intact OU. III, IV, VI - Extraocular movements intact. V - Facial sensation intact bilaterally. VII - Facial movement intact bilaterally. VIII - Hearing & vestibular intact bilaterally. X - Palate elevates symmetrically. XI - Chin turning & shoulder shrug intact bilaterally. XII - Tongue protrusion intact.  Motor Strength - The patient's strength  was normal in all extremities and pronator drift was absent.  Bulk was normal and fasciculations were absent.   Motor Tone - Muscle tone was assessed at the neck and appendages and was normal.  Reflexes - The patient's reflexes were 1+ in all extremities and she had no pathological reflexes.  Sensory - Light touch, temperature/pinprick were assessed and were symmetrical.    Coordination - The patient had normal movements in the hands and feet with no ataxia or dysmetria.  Tremor was absent.  Gait and Station - deferred due to safety concerns.   ASSESSMENT/PLAN [name] is a 75 y.o. female with history of thyroid dysfunction, celiac disease presenting with right diplopia. She did not receive IV t-PA due to resolve symptoms.   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 TIA and seizure less likely.  Resultant  Neuro symptoms resolved  CTA head and neck Unremarkable   MRI  No acute stroke  2D Echo  EF 55-60%    LDL 142  EEG normal  HgbA1c 5.7  Lovenox 30 mg sq daily for VTE prophylaxis Diet gluten free Room service appropriate?: Yes; Fluid consistency:: Thin  No antithrombotic prior to admission, now on aspirin 325 mg daily. Continue ASA on discharge.  Ongoing aggressive stroke risk factor management  Therapy recommendations:  No therapy needs  Disposition:  Return home  No specific activity restrictions. Advised to take it easy.   No neurology followup indicated  Over treatment of hypothyroidism   Pt on supplement of levothyroxine  TSH low and FT4 was high  Need decreased levo dose  Will defer to primary team  Hyperlipidemia  Home meds:  No statin  LDL 142  Has tried statins x 2 in the past, muscle aches with that  New lipitor 10 mg started this admission. Will assess for toleratnce  If does not tolerate, can evaluate for PCSK9 as OP  Continue statin at discharge  Other Stroke  Risk Factors  Advanced age  Other Active Problems  Thyroid disease  Celiac disease  Polycythemia, ok for aspirin, OP monitoring planned   Hospital day # 1  Neurology will sign off. Please call with questions. No neuro follow up needed at this tim. Thanks for the consult.  Rosalin Hawking, MD PhD Stroke Neurology 10/07/2015 5:52 PM    To contact Stroke Continuity provider, please refer to [email or web address] After hours, contact General Neurology

## 2015-10-07 NOTE — Progress Notes (Signed)
Pt discharged at this time. She is alert and oriented, denying any pain or discomfort. She verbalizes understanding of all discharge orders, including follow up appointment, and has 3 prescriptions in hand.  She has all belongings with her and states her cell phone was previously taken home by her family member.

## 2015-10-07 NOTE — Care Management Obs Status (Signed)
[place] NOTIFICATION   Patient Details  Name: Julie [name] MRN: [id number] Date of Birth: May 23, 1941   Medicare Observation Status Notification Given:  Yes    Pollie Friar, RN 10/07/2015, 11:47 AM  [phone]

## 2016-01-25 ENCOUNTER — Ambulatory Visit (INDEPENDENT_AMBULATORY_CARE_PROVIDER_SITE_OTHER): Payer: Medicare Other | Admitting: Family Medicine

## 2016-01-25 ENCOUNTER — Other Ambulatory Visit: Payer: Self-pay

## 2016-01-25 ENCOUNTER — Encounter: Payer: Self-pay | Admitting: Family Medicine

## 2016-01-25 ENCOUNTER — Ambulatory Visit (INDEPENDENT_AMBULATORY_CARE_PROVIDER_SITE_OTHER)
Admission: RE | Admit: 2016-01-25 | Discharge: 2016-01-25 | Disposition: A | Discharge status: Home / Self Care | Payer: Medicare Other | Source: Ambulatory Visit | Attending: Family Medicine | Admitting: Family Medicine

## 2016-01-25 VITALS — BP 122/80 | HR 52 | Ht 65.0 in | Wt 157.0 lb

## 2016-01-25 DIAGNOSIS — M25561 Pain in right knee: Secondary | ICD-10-CM | POA: Diagnosis not present

## 2016-01-25 DIAGNOSIS — S83281A Other tear of lateral meniscus, current injury, right knee, initial encounter: Secondary | ICD-10-CM | POA: Diagnosis not present

## 2016-01-25 DIAGNOSIS — M222X1 Patellofemoral disorders, right knee: Secondary | ICD-10-CM

## 2016-01-25 DIAGNOSIS — S83289A Other tear of lateral meniscus, current injury, unspecified knee, initial encounter: Secondary | ICD-10-CM | POA: Insufficient documentation

## 2016-01-25 NOTE — Assessment & Plan Note (Signed)
W patient does have some chronic subluxation of the kneecap as well as a lateral meniscal tear contribute into her pain. We discussed different treatment options and patient has elected try conservative therapy. X-rays pending, home exercises given and work with Product/process development scientist We discussed icing regimen. Given trial of topical anti-inflammatory's. Discussed which activities to avoid. Patient declined brace. Follow-up again in 3-4 weeks. If worsening symptoms she could be a candidate for possible injection and formal physical therapy.

## 2016-01-25 NOTE — Progress Notes (Signed)
[name] [place] [place], [place] [address] Phone: [phone] Subjective:    I'm seeing this patient by the request  of:    CC: Right knee pain  [id number] Julie [name] is a 75 y.o. female coming in with complaint of right knee pain. Patient states that this is been going on for 2 years intermittently. Does not rib number any true injury into approximately 6 weeks ago when she was playing golf she had an audible pop and pain. States extended had significant swelling. Since then when she wakes up in the morning she has to wait for her knee to pop to allow her to walk otherwise she feels that it is weak. Denies any significant pain but discolored discomfort. Rates the severity of discomfort as 4 out of 10. Not stopping her from any activity and she does workout still most days of the week. Patient has tried Aleve which does seem to help.     Past Medical History  Diagnosis Date  . Thyroid disease   . Celiac disease   . TIA (transient ischemic attack)     2017  . Polycythemia   . Hypertension   . Asthma   . Cancer [facility])    Past Surgical History  Procedure Laterality Date  . Abdominal hysterectomy     Social History   Social History  . Marital Status: Single    Spouse Name: N/A  . Number of Children: N/A  . Years of Education: N/A   Social History Main Topics  . Smoking status: Never Smoker   . Smokeless tobacco: None  . Alcohol Use: Yes     Comment: wine  . Drug Use: None  . Sexual Activity: Not Asked   Other Topics Concern  . None   Social History Narrative   Allergies  Allergen Reactions  . Sulfa Antibiotics Swelling  . Demerol [Meperidine] Rash  . Morphine And Related Rash   Family History  Problem Relation Age of Onset  . Hypertension Mother   . Hypertension Father     Past medical history, social, surgical and family history all reviewed in electronic medical record.  No pertanent information unless stated  regarding to the chief complaint.   Review of Systems: No headache, visual changes, nausea, vomiting, diarrhea, constipation, dizziness, abdominal pain, skin rash, fevers, chills, night sweats, weight loss, swollen lymph nodes, body aches, joint swelling, muscle aches, chest pain, shortness of breath, mood changes.   Objective Blood pressure 122/80, pulse 52, height 5' 5"  (1.651 m), weight 157 lb (71.215 kg), SpO2 98 %.  General: No apparent distress alert and oriented x3 mood and affect normal, dressed appropriately.  HEENT: Pupils equal, extraocular movements intact  Respiratory: Patient's speak in full sentences and does not appear short of breath  Cardiovascular: No lower extremity edema, non tender, no erythema  Skin: Warm dry intact with no signs of infection or rash on extremities or on axial skeleton.  Abdomen: Soft nontender  Neuro: Cranial nerves II through XII are intact, neurovascularly intact in all extremities with 2+ DTRs and 2+ pulses.  Lymph: No lymphadenopathy of posterior or anterior cervical chain or axillae bilaterally.  Gait normal with good balance and coordination.  MSK:  Non tender with full range of motion and good stability and symmetric strength and tone of shoulders, elbows, wrist, hip, and ankles bilaterally.  Knee:right  Trace effusion and lateral translation of the knee Mild tenderness to palpation over the lateral joint  line and the patella ROM full in flexion and extension and lower leg rotation. Ligaments with solid consistent endpoints including ACL, PCL, LCL, MCL. Negative Mcmurray's, Apley's, and Thessalonian tests. painful patellar compression. Patellar glide with mild crepitus. Patellar and quadriceps tendons unremarkable. Hamstring and quadriceps strength is normal.  Contralateral knee unremarkable except for mild subluxation of the knee That seems to be chronic and not as severe as the right side. MSK US performed of: *Right knee This study was  ordered, performed, and interpreted by [name]  Knee: All structures visualized. Anterior lateral meniscus does have a large tear noted with increasing Doppler flow and hypoechoic changes. Chronic degenerative changes also noted. Minimal displacement noted. Patellofemoral joint does have narrowing noted on the lateral aspect. Patellar Tendon unremarkable on long and transverse views without effusion. No abnormality of prepatellar bursa. LCL and MCL unremarkable on long and transverse views. No abnormality of origin of medial or lateral head of the gastrocnemius.  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  This included exercises focusing on stretching, strengthening, with significant focus on eccentric aspects.  Flexion and extension exercises focusing on the vastus medialis oblique as well as hip abductor strengthening for stabilization. Proper technique shown and discussed handout in great detail with ATC.  All questions were discussed and answered.     Impression and Recommendations:     This case required medical decision making of moderate complexity.      Note: This dictation was prepared with Dragon dictation along with smaller phrase technology. Any transcriptional errors that result from this process are unintentional.

## 2016-01-25 NOTE — Progress Notes (Signed)
Pre visit review using our clinic review tool, if applicable. No additional management support is needed unless otherwise documented below in the visit note. 

## 2016-01-25 NOTE — Patient Instructions (Signed)
Good to see you.  Xray downstairs today  Ice 20 minutes 2 times daily. Usually after activity and before bed. pennsaid pinkie amount topically 2 times daily as needed.  Turmeric 572m daily  Vitamin D 2000 IU daily  Exercises 3-5 times a week.  I would cross train a little with biking and your swimming.  Good shoes with rigid bottom.  KJalene Mullet Merrell or New balance greater then 700 See me again in 3-4 weeks to make sure you are doing better.

## 2016-02-01 ENCOUNTER — Emergency Department (HOSPITAL_COMMUNITY)
Admission: EM | Admit: 2016-02-01 | Discharge: 2016-02-01 | Disposition: A | Discharge status: Home / Self Care | Payer: Medicare Other | Attending: Emergency Medicine | Admitting: Emergency Medicine

## 2016-02-01 ENCOUNTER — Encounter (HOSPITAL_COMMUNITY): Payer: Self-pay

## 2016-02-01 DIAGNOSIS — Z859 Personal history of malignant neoplasm, unspecified: Secondary | ICD-10-CM | POA: Insufficient documentation

## 2016-02-01 DIAGNOSIS — I10 Essential (primary) hypertension: Secondary | ICD-10-CM | POA: Insufficient documentation

## 2016-02-01 DIAGNOSIS — Z79899 Other long term (current) drug therapy: Secondary | ICD-10-CM | POA: Diagnosis not present

## 2016-02-01 DIAGNOSIS — Y999 Unspecified external cause status: Secondary | ICD-10-CM | POA: Diagnosis not present

## 2016-02-01 DIAGNOSIS — Z23 Encounter for immunization: Secondary | ICD-10-CM | POA: Diagnosis not present

## 2016-02-01 DIAGNOSIS — Z8673 Personal history of transient ischemic attack (TIA), and cerebral infarction without residual deficits: Secondary | ICD-10-CM | POA: Diagnosis not present

## 2016-02-01 DIAGNOSIS — Y939 Activity, unspecified: Secondary | ICD-10-CM | POA: Insufficient documentation

## 2016-02-01 DIAGNOSIS — S61451A Open bite of right hand, initial encounter: Secondary | ICD-10-CM | POA: Insufficient documentation

## 2016-02-01 DIAGNOSIS — W540XXA Bitten by dog, initial encounter: Secondary | ICD-10-CM | POA: Diagnosis not present

## 2016-02-01 DIAGNOSIS — Y929 Unspecified place or not applicable: Secondary | ICD-10-CM | POA: Diagnosis not present

## 2016-02-01 DIAGNOSIS — J45909 Unspecified asthma, uncomplicated: Secondary | ICD-10-CM | POA: Diagnosis not present

## 2016-02-01 DIAGNOSIS — Z7982 Long term (current) use of aspirin: Secondary | ICD-10-CM | POA: Diagnosis not present

## 2016-02-01 MED ORDER — AMOXICILLIN-POT CLAVULANATE 875-125 MG PO TABS
1.0000 | ORAL_TABLET | Freq: Once | ORAL | Status: AC
  Administered 2016-02-01: 1 via ORAL
  Filled 2016-02-01: qty 1

## 2016-02-01 MED ORDER — AMOXICILLIN-POT CLAVULANATE 875-125 MG PO TABS: 1.0000 | ORAL_TABLET | Freq: Two times a day (BID) | ORAL | Status: DC

## 2016-02-01 MED ORDER — TETANUS-DIPHTH-ACELL PERTUSSIS 5-2.5-18.5 LF-MCG/0.5 IM SUSP
0.5000 mL | Freq: Once | INTRAMUSCULAR | Status: AC
  Administered 2016-02-01: 0.5 mL via INTRAMUSCULAR
  Filled 2016-02-01: qty 0.5

## 2016-02-01 NOTE — ED Notes (Signed)
Patient's dog was dying and patient rubbed her dog to comfort her and the dog bit her right hand. Dog has since died.

## 2016-02-01 NOTE — ED Provider Notes (Signed)
CSN: [id number]     Arrival date & time 02/01/16  1820 History   First MD Initiated Contact with Patient 02/01/16 1831     Chief Complaint  Patient presents with  . Animal Bite     (Consider location/radiation/quality/duration/timing/severity/associated sxs/prior Treatment) HPI Patient presents to the emergency department with A dog bite to her right hand.  The patient states that her dog was dying and she was petting the dog, when the dog bit her hand.  The dog died shortly after the bite.  Patient states that she applied direct pressure and cleansed the wound with water prior to arrival.  She states nothing seems make the condition better, but palpation makes the pain worse Past Medical History  Diagnosis Date  . Thyroid disease   . Celiac disease   . TIA (transient ischemic attack)     2017  . Polycythemia   . Hypertension   . Asthma   . Cancer [facility])    Past Surgical History  Procedure Laterality Date  . Abdominal hysterectomy    . Bladder surgery     Family History  Problem Relation Age of Onset  . Hypertension Mother   . Hypertension Father    Social History  Substance Use Topics  . Smoking status: Never Smoker   . Smokeless tobacco: None  . Alcohol Use: Yes     Comment: daily   OB History    No data available     Review of Systems  All other systems negative except as documented in the HPI. All pertinent positives and negatives as reviewed in the HPI.  Allergies  Sulfa antibiotics; Demerol; and Morphine and related  Home Medications   Prior to Admission medications   Medication Sig Start Date End Date Taking? Authorizing Provider  aspirin 325 MG tablet Take 1 tablet (325 mg total) by mouth daily. 10/07/15  Yes Shanker Kristeen Mans, MD  atorvastatin (LIPITOR) 10 MG tablet Take 1 tablet (10 mg total) by mouth daily at 6 PM. 10/07/15  Yes Shanker Kristeen Mans, MD  beclomethasone (QVAR) 80 MCG/ACT inhaler Inhale 2 puffs into the lungs daily.    Yes Historical  Provider, MD  levothyroxine (SYNTHROID, LEVOTHROID) 125 MCG tablet Take 1 tablet (125 mcg total) by mouth every other day. 10/07/15  Yes Shanker Kristeen Mans, MD  Multiple Vitamins-Minerals (MULTIVITAMIN WITH MINERALS) tablet Take 1 tablet by mouth daily.   Yes Historical Provider, MD  Probiotic Product (PRO-BIOTIC BLEND PO) Take 1 tablet by mouth daily.   Yes Historical Provider, MD  TURMERIC PO Take 1 tablet by mouth daily.   Yes Historical Provider, MD   BP 148/75 mmHg  Pulse 59  Temp(Src) 97.9 F (36.6 C) (Oral)  Resp 18  Ht 5' 5"  (1.651 m)  Wt 70.308 kg  BMI 25.79 kg/m2  SpO2 97% Physical Exam  Constitutional: She is oriented to person, place, and time. She appears well-developed and well-nourished. No distress.  HENT:  Head: Normocephalic and atraumatic.  Pulmonary/Chest: Effort normal.  Musculoskeletal:       Right hand: She exhibits tenderness and swelling. She exhibits normal range of motion, normal two-point discrimination, normal capillary refill and no deformity. Normal sensation noted. Normal strength noted.       Hands: Neurological: She is alert and oriented to person, place, and time.  Skin: Skin is warm and dry.  Nursing note and vitals reviewed.   ED Course  Procedures (including critical care time) Labs Review Labs Reviewed - No data to  display  Imaging Review No results found. I have personally reviewed and evaluated these images and lab results as part of my medical decision-making.   EKG Interpretation None      MDM   Final diagnoses:  None    Patient has Steri-Strips applied to the wounds.  They are skin tears because of the nature of her skin.    LACERATION REPAIR Performed by: Brent General Authorized by: Brent General Consent: Verbal consent obtained. Risks and benefits: risks, benefits and alternatives were discussed Consent given by: patient Patient identity confirmed: provided demographic data Prepped and Draped in normal  sterile fashion Wound explored  Laceration Location: Dorsum of the hand  Laceration Length: 5 cm and 4 centimeters  No Foreign Bodies seen or palpated  Anesthesia: local infiltration  Local anesthetic: None   Anesthetic total: Not applicable   Irrigation method: syringe Amount of cleaning: standard  Skin closure: Steri-Strips    Technique: Benzoin and Steri-Strips   Patient tolerance: Patient tolerated the procedure well with no immediate complications.   Follow up with her pcp. Return here as needed.   Dalia Heading, PA-C 02/03/16 [place], MD 02/05/16 [phone]

## 2016-02-01 NOTE — Discharge Instructions (Signed)
Return here as needed.  Keep the area clean and dry.  Follow-up with your primary care doctor

## 2016-02-01 NOTE — ED Notes (Signed)
Right hand placed in normal saline bath

## 2016-02-23 ENCOUNTER — Ambulatory Visit (INDEPENDENT_AMBULATORY_CARE_PROVIDER_SITE_OTHER): Payer: Medicare Other | Admitting: Family Medicine

## 2016-02-23 ENCOUNTER — Encounter: Payer: Self-pay | Admitting: Family Medicine

## 2016-02-23 VITALS — BP 98/72 | HR 65 | Ht 65.0 in | Wt 158.0 lb

## 2016-02-23 DIAGNOSIS — M222X1 Patellofemoral disorders, right knee: Secondary | ICD-10-CM

## 2016-02-23 DIAGNOSIS — M25561 Pain in right knee: Secondary | ICD-10-CM

## 2016-02-23 NOTE — Progress Notes (Signed)
[name] [address], [place] [address] Phone: [phone] Subjective:       CC: Right knee pain f/u  [id number] Julie [name] is a 75 y.o. female coming in with complaint of right knee pain. Patient was found and patellofemoral arthritis as well as a chronic lateral meniscal tear. Has been doing exercises regularly. Only wears the brace when she is playing golf. Patient states that she has made improvement. Patient states that she is 60-70% better. Still has audible popping but no longer having pain. Denies any swelling. Feels that the exercises, icing and vitamins have been helpful. Locking or giving out on her.   did have x-rays after last exam. These were independently visualized by me. X-ray show moderate arthritic changes especially the patellofemoral joint.  Past Medical History  Diagnosis Date  . Thyroid disease   . Celiac disease   . TIA (transient ischemic attack)     2017  . Polycythemia   . Hypertension   . Asthma   . Cancer [facility])    Past Surgical History  Procedure Laterality Date  . Abdominal hysterectomy    . Bladder surgery     Social History   Social History  . Marital Status: Single    Spouse Name: N/A  . Number of Children: N/A  . Years of Education: N/A   Social History Main Topics  . Smoking status: Never Smoker   . Smokeless tobacco: Not on file  . Alcohol Use: Yes     Comment: daily  . Drug Use: No  . Sexual Activity: Not on file   Other Topics Concern  . Not on file   Social History Narrative   Allergies  Allergen Reactions  . Sulfa Antibiotics Swelling  . Demerol [Meperidine] Rash  . Morphine And Related Rash   Family History  Problem Relation Age of Onset  . Hypertension Mother   . Hypertension Father     Past medical history, social, surgical and family history all reviewed in electronic medical record.  No pertanent information unless stated regarding to the chief complaint.    Review of Systems: No headache, visual changes, nausea, vomiting, diarrhea, constipation, dizziness, abdominal pain, skin rash, fevers, chills, night sweats, weight loss, swollen lymph nodes, body aches, joint swelling, muscle aches, chest pain, shortness of breath, mood changes.   Objective Blood pressure 98/72, pulse 65, weight 158 lb (71.668 kg), SpO2 97 %.  General: No apparent distress alert and oriented x3 mood and affect normal, dressed appropriately.  HEENT: Pupils equal, extraocular movements intact  Respiratory: Patient's speak in full sentences and does not appear short of breath  Cardiovascular: No lower extremity edema, non tender, no erythema  Skin: Warm dry intact with no signs of infection or rash on extremities or on axial skeleton.  Abdomen: Soft nontender  Neuro: Cranial nerves II through XII are intact, neurovascularly intact in all extremities with 2+ DTRs and 2+ pulses.  Lymph: No lymphadenopathy of posterior or anterior cervical chain or axillae bilaterally.  Gait normal with good balance and coordination.  MSK:  Non tender with full range of motion and good stability and symmetric strength and tone of shoulders, elbows, wrist, hip, and ankles bilaterally.  Knee:right  No effusion noted Minimal tenderness over the patellofemoral joint laterally ROM full in flexion and extension and lower leg rotation. Ligaments with solid consistent endpoints including ACL, PCL, LCL, MCL. Negative Mcmurray's, Apley's, and Thessalonian tests. painful patellar compression but actually  improved. Patellar glide with mild crepitus. Patellar and quadriceps tendons unremarkable. Hamstring and quadriceps strength is normal.  Contralateral knee unremarkable except for mild subluxation of the knee That seems to be chronic and not as severe as the right side.     Impression and Recommendations:     This case required medical decision making of moderate complexity.      Note: This  dictation was prepared with Dragon dictation along with smaller phrase technology. Any transcriptional errors that result from this process are unintentional.

## 2016-02-23 NOTE — Patient Instructions (Signed)
Good to se eyou  Ice is your friend COntinue the exercises 2 times a week Enjoy golf Sorry for your loss.  See me again in 2 months if not satisfied with where you are.

## 2016-02-23 NOTE — Assessment & Plan Note (Signed)
Doing very well at this time. Encourage her to continue to use of brace with increasing activity. We discussed the icing regimen. Encourage her to continue the home exercises and differently. Patient declined any formal physical therapy. We did discuss that certain things such as injections as well as custom bracing may be needed in the future. Patient will continue to be active. Follow-up in 2 months.  Spent  25 minutes with patient face-to-face and had greater than 50% of counseling including as described above in assessment and plan.

## 2016-03-30 NOTE — Progress Notes (Signed)
[name] [address] [place], [place] [address] Phone: [phone] Subjective:      CC: wrist pain  [id number]  Julie [name] is a 75 y.o. female coming in with complaint of left wrist pain. Patient states  She fell 2 weeks ago. Patient had mild pain at the time. Seems to be worsening slowly. Never getting better. States that certain movements causes a severe pain. Patient is an avid golfer and is been unable to call for the last 3 days secondary to the discomfort. Patient states that there is continued to be swelling. Patient points the pain being very localized near the base of her thumb.   patient was seen one month ago for a right knee pain. 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 Asthma   . Cancer ([place])   . Celiac disease   . Hypertension   . Polycythemia   . Thyroid disease   . TIA (transient ischemic attack)    2017   Past Surgical History:  Procedure Laterality Date  . ABDOMINAL HYSTERECTOMY    . BLADDER SURGERY     Social History   Social History  . Marital status: Single    Spouse name: N/A  . Number of children: N/A  . Years of education: N/A   Social History Main Topics  . Smoking status: Never Smoker  . Smokeless tobacco: None  . Alcohol use Yes     Comment: daily  . Drug use: No  . Sexual activity: Not Asked   Other Topics Concern  . None   Social History Narrative  . None   Allergies  Allergen Reactions  . Sulfa Antibiotics Swelling  . Demerol [Meperidine] Rash  . Morphine And Related Rash   Family History  Problem Relation Age of Onset  . Hypertension Mother   . Hypertension Father     Past medical history, social, surgical and family history all reviewed in electronic medical record.  No pertanent information unless stated regarding to the chief complaint.   Review of Systems: No headache, visual changes, nausea,  vomiting, diarrhea, constipation, dizziness, abdominal pain, skin rash, fevers, chills, night sweats, weight loss, swollen lymph nodes, body aches, joint swelling, muscle aches, chest pain, shortness of breath, mood changes.   Objective  Blood pressure 108/72, pulse 80, weight 159 lb (72.1 kg), SpO2 97 %.  General: No apparent distress alert and oriented x3 mood and affect normal, dressed appropriately.  HEENT: Pupils equal, extraocular movements intact  Respiratory: Patient's speak in full sentences and does not appear short of breath  Cardiovascular: No lower extremity edema, non tender, no erythema  Skin: Warm dry intact with no signs of infection or rash on extremities or on axial skeleton.  Abdomen: Soft nontender  Neuro: Cranial nerves II through XII are intact, neurovascularly intact in all extremities with 2+ DTRs and 2+ pulses.  Lymph: No lymphadenopathy of posterior or anterior cervical chain or axillae bilaterally.  Gait normal with good balance and coordination.  MSK:  Non tender with full range of motion and good stability and symmetric strength and tone of shoulders, elbows, wrist, hip, and ankles bilaterally. Mild arthritic changes and patellofemoral grinding still noted of the right knee Wrist left trace swelling over the distal radius Patient lacking the last 10 of dorsiflexion as well as abduction Palpation is normal over metacarpals, navicular, lunate, and TFCC; tendons without tenderness/ swelling Severe  tenderness over the anatomical snuffbox No tenderness over Canal of [place]. Strength 5/5 in all directions without pain. Negative Finkelstein, tinel's and phalens. Contralateral wrist unremarkable  MSK US performed of: left wrist This study was ordered, performed, and interpreted by [name]  Wrist: All extensor compartments visualized and tendons all normal in appearance without fraying, tears, or sheath effusions. No effusion seen. TFCC intact. Scapholunate  ligament intact. Carpal tunnel visualized and median nerve area normal, flexor tendons all normal in appearance without fraying, tears, or sheath effusions. Patient scaphoid bone as well as distal radiushave some cortical defect noted. No significant displacement. Hypoechoic changes. Mild soft callus formation or any forming.  IMPRESSION: cortical defect in the mid body of the scaphoid noted with hypoechoic changes and increasing Doppler flow     Impression and Recommendations:     This case required medical decision making of moderate complexity.      Note: This dictation was prepared with Dragon dictation along with smaller phrase technology. Any transcriptional errors that result from this process are unintentional.

## 2016-03-31 ENCOUNTER — Encounter: Payer: Self-pay | Admitting: Family Medicine

## 2016-03-31 ENCOUNTER — Ambulatory Visit (INDEPENDENT_AMBULATORY_CARE_PROVIDER_SITE_OTHER)
Admission: RE | Admit: 2016-03-31 | Discharge: 2016-03-31 | Disposition: A | Discharge status: Home / Self Care | Payer: Medicare Other | Source: Ambulatory Visit | Attending: Family Medicine | Admitting: Family Medicine

## 2016-03-31 ENCOUNTER — Ambulatory Visit (INDEPENDENT_AMBULATORY_CARE_PROVIDER_SITE_OTHER): Payer: Medicare Other | Admitting: Family Medicine

## 2016-03-31 ENCOUNTER — Other Ambulatory Visit: Payer: Self-pay

## 2016-03-31 DIAGNOSIS — M25532 Pain in left wrist: Secondary | ICD-10-CM

## 2016-03-31 DIAGNOSIS — S62025A Nondisplaced fracture of middle third of navicular [scaphoid] bone of left wrist, initial encounter for closed fracture: Secondary | ICD-10-CM | POA: Diagnosis not present

## 2016-03-31 DIAGNOSIS — S62022A Displaced fracture of middle third of navicular [scaphoid] bone of left wrist, initial encounter for closed fracture: Secondary | ICD-10-CM | POA: Insufficient documentation

## 2016-03-31 MED ORDER — VITAMIN D (ERGOCALCIFEROL) 1.25 MG (50000 UNIT) PO CAPS: 50000.0000 [IU] | ORAL_CAPSULE | ORAL | 0 refills | Status: DC

## 2016-03-31 NOTE — Assessment & Plan Note (Signed)
Appears to be nondisplaced on ultrasound today. We discussed getting a x-rays well. Patient will be foot in a thumb spica splint for the next 2 weeks. Follow-up at that time and advance accordingly. Once weekly vitamin D prescribed.

## 2016-03-31 NOTE — Patient Instructions (Signed)
God to see you  Xray downstairs Wear brace day and night for next 2 weeks.  Once weekly vitamin D to help healing. If pain then do ice 10 minutes 4 times daily  See me again in 3 weeks and we will make sure you are healing and advance you

## 2016-04-18 NOTE — Progress Notes (Signed)
[name] [place], [place] [address] Phone: [phone] Subjective:      CC: wrist pain Follow-up left  [id number]  Julie [name] is a 75 y.o. female coming in with complaint of left wrist pain. Found to have a small cortical defect of the scaphoid bone. Patient was put in a brace. Once weekly vitamin D supplementation and icing protocol. Patient states She has been wearing the brace religiously. States that she feels stiffness but states that the pain feel significantly better.     Past Medical History:  Diagnosis Date  . Asthma   . Cancer ([place])   . Celiac disease   . Hypertension   . Polycythemia   . Thyroid disease   . TIA (transient ischemic attack)    2017   Past Surgical History:  Procedure Laterality Date  . ABDOMINAL HYSTERECTOMY    . BLADDER SURGERY     Social History   Social History  . Marital status: Single    Spouse name: N/A  . Number of children: N/A  . Years of education: N/A   Social History Main Topics  . Smoking status: Never Smoker  . Smokeless tobacco: Not on file  . Alcohol use Yes     Comment: daily  . Drug use: No  . Sexual activity: Not on file   Other Topics Concern  . Not on file   Social History Narrative  . No narrative on file   Allergies  Allergen Reactions  . Sulfa Antibiotics Swelling  . Demerol [Meperidine] Rash  . Morphine And Related Rash   Family History  Problem Relation Age of Onset  . Hypertension Mother   . Hypertension Father     Past medical history, social, surgical and family history all reviewed in electronic medical record.  No pertanent information unless stated regarding to the chief complaint.   Review of Systems: No headache, visual changes, nausea, vomiting, diarrhea, constipation, dizziness, abdominal pain, skin rash, fevers, chills, night sweats, weight loss, swollen lymph nodes, body aches, joint swelling, muscle aches, chest pain, shortness of  breath, mood changes.   Objective  There were no vitals taken for this visit.  General: No apparent distress alert and oriented x3 mood and affect normal, dressed appropriately.  HEENT: Pupils equal, extraocular movements intact  Respiratory: Patient's speak in full sentences and does not appear short of breath  Cardiovascular: No lower extremity edema, non tender, no erythema  Skin: Warm dry intact with no signs of infection or rash on extremities or on axial skeleton.  Abdomen: Soft nontender  Neuro: Cranial nerves II through XII are intact, neurovascularly intact in all extremities with 2+ DTRs and 2+ pulses.  Lymph: No lymphadenopathy of posterior or anterior cervical chain or axillae bilaterally.  Gait normal with good balance and coordination.  MSK:  Non tender with full range of motion and good stability and symmetric strength and tone of shoulders, elbows, wrist, hip, and ankles bilaterally. Mild arthritic changes and patellofemoral grinding still noted of the right knee Wrist left trace swelling over the distal radius Mild decrease in range of motion still within dorsiflexion Palpation is normal over metacarpals, navicular, lunate, and TFCC; tendons without tenderness/ swelling Moderate tenderness over the anatomical snuffbox No tenderness over Canal of [place]. Strength 5/5 in all directions without pain. Negative Finkelstein, tinel's and phalens. Contralateral wrist unremarkable Mild improvement  MSK US performed of: left wrist This study was ordered, performed, and interpreted by Thedore Mins  Smith D.O.  Wrist: All extensor compartments visualized and tendons all normal in appearance without fraying, tears, or sheath effusions. No effusion seen. TFCC intact. Scapholunate ligament intact. Carpal tunnel visualized and median nerve area normal, flexor tendons all normal in appearance without fraying, tears, or sheath effusions. Patient scaphoid bone still has cortical defect with  some mild interval healing  IMPRESSION: cortical defect in the mid body of the scaphoid still noted but significant decrease in hypoechoic changes with increasing upper flow     Impression and Recommendations:     This case required medical decision making of moderate complexity.      Note: This dictation was prepared with Dragon dictation along with smaller phrase technology. Any transcriptional errors that result from this process are unintentional.

## 2016-04-19 ENCOUNTER — Ambulatory Visit (INDEPENDENT_AMBULATORY_CARE_PROVIDER_SITE_OTHER): Payer: Medicare Other | Admitting: Family Medicine

## 2016-04-19 ENCOUNTER — Encounter: Payer: Self-pay | Admitting: Family Medicine

## 2016-04-19 ENCOUNTER — Other Ambulatory Visit: Payer: Self-pay

## 2016-04-19 VITALS — BP 120/80 | HR 59 | Wt 159.0 lb

## 2016-04-19 DIAGNOSIS — M1812 Unilateral primary osteoarthritis of first carpometacarpal joint, left hand: Secondary | ICD-10-CM | POA: Diagnosis not present

## 2016-04-19 DIAGNOSIS — S62025A Nondisplaced fracture of middle third of navicular [scaphoid] bone of left wrist, initial encounter for closed fracture: Secondary | ICD-10-CM | POA: Diagnosis not present

## 2016-04-19 DIAGNOSIS — S62025G Nondisplaced fracture of middle third of navicular [scaphoid] bone of left wrist, subsequent encounter for fracture with delayed healing: Secondary | ICD-10-CM

## 2016-04-19 DIAGNOSIS — M25532 Pain in left wrist: Secondary | ICD-10-CM

## 2016-04-19 DIAGNOSIS — M189 Osteoarthritis of first carpometacarpal joint, unspecified: Secondary | ICD-10-CM | POA: Insufficient documentation

## 2016-04-19 NOTE — Assessment & Plan Note (Signed)
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 If worsening symptoms we may consider injection in this area at follow-up.

## 2016-04-19 NOTE — Assessment & Plan Note (Signed)
Patient does have some delayed healing. We discussed over-the-counter medications. Given some range of motion exercises*. Continue the once weekly vitamin D. Follow-up again in 2 weeks. His lungs patient has some changes we will advance her accordingly.

## 2016-04-19 NOTE — Patient Instructions (Signed)
God to see you  Continue the vitamin D Ice is your friend Wear brace most of the time the next week but can come out while sitting and move the wrist.  Spell the alphabet.  On Monday do not wear it in the house and lets see how you do. Only 2 days of either driving range or water aerobics but no weight.  K2 daily can help with bone healing but we would only use it for 1 month.  See me again in 2 weeks to make sure doing well

## 2016-05-02 NOTE — Progress Notes (Signed)
[name] [address] [place], [place] [address] Phone: [phone] Subjective:      CC: wrist pain Follow-up left  [id number]  Julie [name] is a 75 y.o. female coming in with complaint of left wrist pain. Found to have a small cortical defect of the scaphoid bone. Patient was put in a brace. Once weekly vitamin D supplementation and icing protocol. Patient states She has been wearing the brace religiously. Patient was making significant improvement and was starting to increase range of motion and use it on a more regular basis. Patient also has [place] arthritis. Patient states Overall she is feeling good. Has been doing the range of motion. Feels like it is making difference. Still having pain at the base of the thumb but not as severe as what it was. Like to start playing golf.     Past Medical History:  Diagnosis Date  . Asthma   . Cancer ([place])   . Celiac disease   . Hypertension   . Polycythemia   . Thyroid disease   . TIA (transient ischemic attack)    2017   Past Surgical History:  Procedure Laterality Date  . ABDOMINAL HYSTERECTOMY    . BLADDER SURGERY     Social History   Social History  . Marital status: Single    Spouse name: N/A  . Number of children: N/A  . Years of education: N/A   Social History Main Topics  . Smoking status: Never Smoker  . Smokeless tobacco: Not on file  . Alcohol use Yes     Comment: daily  . Drug use: No  . Sexual activity: Not on file   Other Topics Concern  . Not on file   Social History Narrative  . No narrative on file   Allergies  Allergen Reactions  . Sulfa Antibiotics Swelling  . Demerol [Meperidine] Rash  . Morphine And Related Rash   Family History  Problem Relation Age of Onset  . Hypertension Mother   . Hypertension Father     Past medical history, social, surgical and family history all reviewed in electronic medical record.  No pertanent information unless stated  regarding to the chief complaint.   Review of Systems: No headache, visual changes, nausea, vomiting, diarrhea, constipation, dizziness, abdominal pain, skin rash, fevers, chills, night sweats, weight loss, swollen lymph nodes, body aches, joint swelling, muscle aches, chest pain, shortness of breath, mood changes.   Objective  There were no vitals taken for this visit.  General: No apparent distress alert and oriented x3 mood and affect normal, dressed appropriately.  HEENT: Pupils equal, extraocular movements intact  Respiratory: Patient's speak in full sentences and does not appear short of breath  Cardiovascular: No lower extremity edema, non tender, no erythema  Skin: Warm dry intact with no signs of infection or rash on extremities or on axial skeleton.  Abdomen: Soft nontender  Neuro: Cranial nerves II through XII are intact, neurovascularly intact in all extremities with 2+ DTRs and 2+ pulses.  Lymph: No lymphadenopathy of posterior or anterior cervical chain or axillae bilaterally.  Gait normal with good balance and coordination.  MSK:  Non tender with full range of motion and good stability and symmetric strength and tone of shoulders, elbows, wrist, hip, and ankles bilaterally. Mild arthritic changes and patellofemoral grinding still noted of the right knee Wrist left No swelling noted Increasing range of motion is near full. Palpation is normal over metacarpals, navicular, lunate, and  TFCC; tendons without tenderness/ swelling Nontender over the anatomical snuffbox but severe tenderness still over the [facility] joint No tenderness over Canal of Guyon. Strength 5/5 in all directions without pain. Negative Finkelstein, tinel's and phalens. Contralateral wrist unremarkable Moderate improvement      Impression and Recommendations:     This case required medical decision making of moderate complexity.      Note: This dictation was prepared with Dragon dictation along with  smaller phrase technology. Any transcriptional errors that result from this process are unintentional.

## 2016-05-03 ENCOUNTER — Ambulatory Visit (INDEPENDENT_AMBULATORY_CARE_PROVIDER_SITE_OTHER): Payer: Medicare Other | Admitting: Family Medicine

## 2016-05-03 ENCOUNTER — Encounter: Payer: Self-pay | Admitting: Family Medicine

## 2016-05-03 DIAGNOSIS — S62025G Nondisplaced fracture of middle third of navicular [scaphoid] bone of left wrist, subsequent encounter for fracture with delayed healing: Secondary | ICD-10-CM | POA: Diagnosis not present

## 2016-05-03 DIAGNOSIS — M1812 Unilateral primary osteoarthritis of first carpometacarpal joint, left hand: Secondary | ICD-10-CM

## 2016-05-03 NOTE — Assessment & Plan Note (Signed)
Patient has made significant strides. I do think that she'll do very well with conservative therapy. Patient continued to increase range of motion activities. I do believe that [facility] arthritis is likely contributing to most of her pain at this time. We discussed injections if needed. Do not feel that further imaging is necessary.

## 2016-05-03 NOTE — Patient Instructions (Signed)
Good to see you  Ice 20 minutes 2 times daily. Usually after activity or golf would be great  For the arthritis hand in the warm water could be helpful.  Possible gloves at night Tylenol 325-650 mg 3 times a day scheduled Turmeric 526m daily  Vitamin D 2000 IU daily  Tart cherry extract at night any dose.  See me agai nin 4 weeks if not great and we can always consider injection in the thumb.

## 2016-05-03 NOTE — Assessment & Plan Note (Signed)
We'll continue to monitor. If worsening symptoms consider injection.

## 2016-05-26 ENCOUNTER — Other Ambulatory Visit: Payer: Self-pay

## 2016-05-26 ENCOUNTER — Encounter: Payer: Self-pay | Admitting: Family Medicine

## 2016-05-26 ENCOUNTER — Ambulatory Visit (INDEPENDENT_AMBULATORY_CARE_PROVIDER_SITE_OTHER): Payer: Medicare Other | Admitting: Family Medicine

## 2016-05-26 VITALS — BP 112/76 | HR 51

## 2016-05-26 DIAGNOSIS — M1812 Unilateral primary osteoarthritis of first carpometacarpal joint, left hand: Secondary | ICD-10-CM

## 2016-05-26 NOTE — Assessment & Plan Note (Signed)
Patient given injection today and tolerated the procedure well. We discussed icing regimen and home exercises. We discussed which activities to do an which was to avoid. We discussed continuing bracing with golf. Patient will follow-up with me again in 4 weeks.

## 2016-05-26 NOTE — Progress Notes (Signed)
[name] [address] [place], [place] [address] Phone: [phone] Subjective:      CC: wrist pain Follow-up left  [id number]  Julie [name] is a 75 y.o. female coming in with complaint of left wrist pain. Found to have a small cortical defect of the scaphoid bone. Patient was put in a brace. Once weekly vitamin D supplementation and icing protocol. Patient states She has been wearing the brace religiously. Patient was making significant improvement and was starting to increase range of motion and use it on a more regular basis. Patient also has [place] arthritis.  Patient states that the [facility] or the thumb itself seems to be worsening pain. States that this is what is affecting her golf game as well as some of her daily activities. States that it is a significant soreness. Has been bracing which implies but unfortunately continues to have the pain.     Past Medical History:  Diagnosis Date  . Asthma   . Cancer ([place])   . Celiac disease   . Hypertension   . Polycythemia   . Thyroid disease   . TIA (transient ischemic attack)    2017   Past Surgical History:  Procedure Laterality Date  . ABDOMINAL HYSTERECTOMY    . BLADDER SURGERY     Social History   Social History  . Marital status: Single    Spouse name: N/A  . Number of children: N/A  . Years of education: N/A   Social History Main Topics  . Smoking status: Never Smoker  . Smokeless tobacco: None  . Alcohol use Yes     Comment: daily  . Drug use: No  . Sexual activity: Not Asked   Other Topics Concern  . None   Social History Narrative  . None   Allergies  Allergen Reactions  . Sulfa Antibiotics Swelling  . Demerol [Meperidine] Rash  . Morphine And Related Rash   Family History  Problem Relation Age of Onset  . Hypertension Mother   . Hypertension Father     Past medical history, social, surgical and family history all reviewed in electronic medical record.  No  pertanent information unless stated regarding to the chief complaint.   Review of Systems: No headache, visual changes, nausea, vomiting, diarrhea, constipation, dizziness, abdominal pain, skin rash, fevers, chills, night sweats, weight loss, swollen lymph nodes, body aches, joint swelling, muscle aches, chest pain, shortness of breath, mood changes.   Objective  Blood pressure 112/76, pulse (!) 51, SpO2 98 %.  General: No apparent distress alert and oriented x3 mood and affect normal, dressed appropriately.  HEENT: Pupils equal, extraocular movements intact  Respiratory: Patient's speak in full sentences and does not appear short of breath  Cardiovascular: No lower extremity edema, non tender, no erythema  Skin: Warm dry intact with no signs of infection or rash on extremities or on axial skeleton.  Abdomen: Soft nontender  Neuro: Cranial nerves II through XII are intact, neurovascularly intact in all extremities with 2+ DTRs and 2+ pulses.  Lymph: No lymphadenopathy of posterior or anterior cervical chain or axillae bilaterally.  Gait normal with good balance and coordination.  MSK:  Non tender with full range of motion and good stability and symmetric strength and tone of shoulders, elbows, wrist, hip, and ankles bilaterally. Mild arthritic changes and patellofemoral grinding still noted of the right knee Wrist left Mild swelling over the ECU Increasing range of motion is near full. Palpation is normal  over metacarpals, navicular, lunate, and TFCC; tendons without tenderness/ swelling Worsening tenderness over the [facility] with a positive grind test No tenderness over Canal of Guyon. Strength 5/5 in all directions without pain. Negative Finkelstein, tinel's and phalens. Contralateral wrist unremarkable  Procedure: Real-time Ultrasound Guided Injection of left CMC joint Device: GE Logiq E  Ultrasound guided injection is preferred based studies that show increased duration, increased effect,  greater accuracy, decreased procedural pain, increased response rate, and decreased cost with ultrasound guided versus blind injection.  Verbal informed consent obtained.  Time-out conducted.  Noted no overlying erythema, induration, or other signs of local infection.  Skin prepped in a sterile fashion.  Local anesthesia: Topical Ethyl chloride.  With sterile technique and under real time ultrasound guidance:  With a 25-gauge half-inch needle patient was injected with a total of 0.5 mL of 0.5% Marcaine and 0.5 mL of Kenalog 40 mg/dL. Completed without difficulty  Pain immediately resolved suggesting accurate placement of the medication.  Advised to call if fevers/chills, erythema, induration, drainage, or persistent bleeding.  Images permanently stored and available for review in the ultrasound unit.  Impression: Technically successful ultrasound guided injection.    Impression and Recommendations:     This case required medical decision making of moderate complexity.      Note: This dictation was prepared with Dragon dictation along with smaller phrase technology. Any transcriptional errors that result from this process are unintentional.

## 2016-05-31 ENCOUNTER — Ambulatory Visit: Payer: Medicare Other | Admitting: Family Medicine

## 2016-07-25 ENCOUNTER — Other Ambulatory Visit: Payer: Self-pay | Admitting: Internal Medicine

## 2016-07-25 DIAGNOSIS — Z1231 Encounter for screening mammogram for malignant neoplasm of breast: Secondary | ICD-10-CM

## 2016-09-12 ENCOUNTER — Ambulatory Visit
Admission: RE | Admit: 2016-09-12 | Discharge: 2016-09-12 | Disposition: A | Discharge status: Home / Self Care | Payer: Medicare Other | Source: Ambulatory Visit | Attending: Internal Medicine | Admitting: Internal Medicine

## 2016-09-12 DIAGNOSIS — Z1231 Encounter for screening mammogram for malignant neoplasm of breast: Secondary | ICD-10-CM

## 2017-01-19 ENCOUNTER — Ambulatory Visit (INDEPENDENT_AMBULATORY_CARE_PROVIDER_SITE_OTHER): Payer: Medicare Other | Admitting: Family Medicine

## 2017-01-19 ENCOUNTER — Encounter: Payer: Self-pay | Admitting: Family Medicine

## 2017-01-19 ENCOUNTER — Ambulatory Visit: Payer: Self-pay

## 2017-01-19 VITALS — BP 104/66 | HR 73 | Wt 156.0 lb

## 2017-01-19 DIAGNOSIS — M79642 Pain in left hand: Secondary | ICD-10-CM | POA: Diagnosis not present

## 2017-01-19 DIAGNOSIS — M1812 Unilateral primary osteoarthritis of first carpometacarpal joint, left hand: Secondary | ICD-10-CM

## 2017-01-19 NOTE — Patient Instructions (Signed)
Yo know the drill  Should help for quite sometime.  Ice is your friend.  Wear the brace if you need  Continue the vitamins See me when you need me.

## 2017-01-19 NOTE — Progress Notes (Signed)
[name] [address] [place], [place] [address] Phone: [phone] Subjective:      CC: wrist pain Follow-up left  XNA:[id number]  Julie [name] is a 76 y.o. female coming in with complaint of left wrist pain. Found to have a small cortical defect of the scaphoid bone. 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 Patient was seen 8 months ago and given an injection. Patient states a double for some time. Having worsening symptoms. Patient states that it is affecting her golf game. Describes the pain as a dull, throbbing aching pain. States that sometimes some weakness.     Past Medical History:  Diagnosis Date  . Asthma   . Cancer ([place])   . Celiac disease   . Hypertension   . Polycythemia   . Thyroid disease   . TIA (transient ischemic attack)    2017   Past Surgical History:  Procedure Laterality Date  . ABDOMINAL HYSTERECTOMY    . BLADDER SURGERY     Social History   Social History  . Marital status: Single    Spouse name: N/A  . Number of children: N/A  . Years of education: N/A   Social History Main Topics  . Smoking status: Never Smoker  . Smokeless tobacco: Never Used  . Alcohol use Yes     Comment: daily  . Drug use: No  . Sexual activity: Not Asked   Other Topics Concern  . None   Social History Narrative  . None   Allergies  Allergen Reactions  . Sulfa Antibiotics Swelling  . Demerol [Meperidine] Rash  . Morphine And Related Rash   Family History  Problem Relation Age of Onset  . Hypertension Mother   . Hypertension Father     Past medical history, social, surgical and family history all reviewed in electronic medical record.  No pertanent information unless stated regarding to the chief complaint.   Review of Systems: No headache, visual changes, nausea, vomiting, diarrhea, constipation, dizziness, abdominal pain, skin rash, fevers, chills, night sweats, weight loss, swollen lymph  nodes, body aches, joint swelling, muscle aches, chest pain, shortness of breath, mood changes.     Objective  Blood pressure 104/66, weight 156 lb (70.8 kg).  Systems examined below as of 01/19/17 General: NAD A&O x3 mood, affect normal  HEENT: Pupils equal, extraocular movements intact no nystagmus Respiratory: not short of breath at rest or with speaking Cardiovascular: No lower extremity edema, non tender Skin: Warm dry intact with no signs of infection or rash on extremities or on axial skeleton. Abdomen: Soft nontender, no masses Neuro: Cranial nerves  intact, neurovascularly intact in all extremities with 2+ DTRs and 2+ pulses. Lymph: No lymphadenopathy appreciated today  Gait normal with good balance and coordination.  MSK: Non tender with full range of motion and good stability and symmetric strength and tone of shoulders, elbows,  knee hips and ankles bilaterally.  Arthritic changes of multiple joints Wrist: Left Arthritic changes noted. ROM smooth and normal with good flexion and extension and ulnar/radial deviation that is symmetrical with opposite wrist. Pain with flexion over the [facility] joint Tenderness over the [facility] joint No snuffbox tenderness. No tenderness over [place]. Strength 5/5 in all directions without pain. Negative Finkelstein, tinel's and phalens. Positive grind test  Procedure: Real-time Ultrasound Guided Injection of left CMC joint Device: GE Logiq Q7 Ultrasound guided injection is preferred based studies that show increased duration,  increased effect, greater accuracy, decreased procedural pain, increased response rate, and decreased cost with ultrasound guided versus blind injection.  Verbal informed consent obtained.  Time-out conducted.  Noted no overlying erythema, induration, or other signs of local infection.  Skin prepped in a sterile fashion.  Local anesthesia: Topical Ethyl chloride.  With sterile technique and under real time ultrasound  guidance:  With a 25-gauge half-inch needle patient was injected with a total of 0.5 mL of 0.5% Marcaine and 0.5 mL of Kenalog 40 mg/dL Completed without difficulty  Pain immediately resolved suggesting accurate placement of the medication.  Advised to call if fevers/chills, erythema, induration, drainage, or persistent bleeding.  Images permanently stored and available for review in the ultrasound unit.  Impression: Technically successful ultrasound guided injection.    Impression and Recommendations:     This case required medical decision making of moderate complexity.      Note: This dictation was prepared with Dragon dictation along with smaller phrase technology. Any transcriptional errors that result from this process are unintentional.

## 2017-01-19 NOTE — Assessment & Plan Note (Addendum)
Patient and another injection today. Patient was having worsening symptoms. We discussed icing regimen and home exercises. We discussed vitamin D. Patient will come back and see me again in 10 weeks

## 2017-02-23 DIAGNOSIS — D099 Carcinoma in situ, unspecified: Secondary | ICD-10-CM

## 2017-02-23 HISTORY — DX: Carcinoma in situ, unspecified: D09.9

## 2017-03-30 ENCOUNTER — Ambulatory Visit (INDEPENDENT_AMBULATORY_CARE_PROVIDER_SITE_OTHER): Payer: Medicare Other | Admitting: Family Medicine

## 2017-03-30 ENCOUNTER — Encounter: Payer: Self-pay | Admitting: Family Medicine

## 2017-03-30 DIAGNOSIS — M25561 Pain in right knee: Secondary | ICD-10-CM

## 2017-03-30 NOTE — Patient Instructions (Signed)
Good to see you  Overall not bad Patella or knee cap is not moving right Start the exercises again 3 times a week  New brace to wear with golf and working out.  pennsaid pinkie amount topically 2 times daily as needed.   See me again in 4-6 weeks.

## 2017-03-30 NOTE — Progress Notes (Signed)
[name] [place], [place] [address] Phone: [phone] Subjective:      CC: Right knee pain  [id number]  Julie [name] is a 76 y.o. female coming in with complaint of right knee pain. Patient has been seen for this previously. 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 Patient is having worsening symptoms again. Patient states that unfortunately the home exercises and working. Patient is an avid golfer and has noticed and if she tries to walk too long and unfortunate causes more pain. Sometimes has a snapping motion. Sometimes can have difficulty with this.     Past Medical History:  Diagnosis Date  . Asthma   . Cancer ([place])   . Celiac disease   . Hypertension   . Polycythemia   . Thyroid disease   . TIA (transient ischemic attack)    2017   Past Surgical History:  Procedure Laterality Date  . ABDOMINAL HYSTERECTOMY    . BLADDER SURGERY     Social History   Social History  . Marital status: Single    Spouse name: N/A  . Number of children: N/A  . Years of education: N/A   Social History Main Topics  . Smoking status: Never Smoker  . Smokeless tobacco: Never Used  . Alcohol use Yes     Comment: daily  . Drug use: No  . Sexual activity: Not Asked   Other Topics Concern  . None   Social History Narrative  . None   Allergies  Allergen Reactions  . Sulfa Antibiotics Swelling  . Demerol [Meperidine] Rash  . Morphine And Related Rash   Family History  Problem Relation Age of Onset  . Hypertension Mother   . Hypertension Father      Past medical history, social, surgical and family history all reviewed in electronic medical record.  No pertanent information unless stated regarding to the chief complaint.   Review of Systems:Review of systems updated and as accurate as of 03/30/17  No headache, visual changes, nausea, vomiting, diarrhea,  constipation, dizziness, abdominal pain, skin rash, fevers, chills, night sweats, weight loss, swollen lymph nodes, body aches, joint swelling, muscle aches, chest pain, shortness of breath, mood changes.   Objective  Blood pressure 102/70, pulse (!) 55, height 5' 5"  (1.651 m), weight 157 lb (71.2 kg), SpO2 95 %. Systems examined below as of 03/30/17   General: No apparent distress alert and oriented x3 mood and affect normal, dressed appropriately.  HEENT: Pupils equal, extraocular movements intact  Respiratory: Patient's speak in full sentences and does not appear short of breath  Cardiovascular: No lower extremity edema, non tender, no erythema  Skin: Warm dry intact with no signs of infection or rash on extremities or on axial skeleton.  Abdomen: Soft nontender  Neuro: Cranial nerves II through XII are intact, neurovascularly intact in all extremities with 2+ DTRs and 2+ pulses.  Lymph: No lymphadenopathy of posterior or anterior cervical chain or axillae bilaterally.  Gait normal with good balance and coordination.  MSK:  Non tender with full range of motion and good stability and symmetric strength and tone of shoulders, elbows, wrist, hip, and ankles bilaterally. Arthritic changes of multiple joints Knee: Right Lateral tilt noted Palpation normal with no warmth, joint line tenderness, patellar tenderness, or condyle tenderness. ROM full in flexion and extension and lower leg rotation. Ligaments with solid consistent endpoints including ACL, PCL, LCL, MCL. Negative Mcmurray's,  Apley's, and Thessalonian tests. painful patellar compression. Patellar glide with moderate crepitus. Patellar and quadriceps tendons unremarkable. Hamstring and quadriceps strength is normal.  Contralateral knee mild arthritic changes but no tenderness     Impression and Recommendations:     This case required medical decision making of moderate complexity.      Note: This dictation was prepared  with Dragon dictation along with smaller phrase technology. Any transcriptional errors that result from this process are unintentional.

## 2017-03-30 NOTE — Assessment & Plan Note (Signed)
Worsening symptoms of patellofemoral arthritis. Given a patellofemoral brace. Encourage home exercises. Trial of topical anti-inflammatories, icing regimen. Follow-up again in 4-6 weeks. Worsening symptoms consider injection

## 2017-04-14 ENCOUNTER — Telehealth: Payer: Self-pay | Admitting: Cardiovascular Disease

## 2017-04-14 NOTE — Telephone Encounter (Signed)
Received incoming records from [facility] for upcoming appointment on 05/11/17 @ 9:20am with Dr. Oval Linsey. Records given to [facility] in Medical Records. 04/14/17 ab

## 2017-04-19 ENCOUNTER — Ambulatory Visit: Payer: Medicare Other | Admitting: Cardiovascular Disease

## 2017-05-11 ENCOUNTER — Ambulatory Visit (INDEPENDENT_AMBULATORY_CARE_PROVIDER_SITE_OTHER): Payer: Medicare Other | Admitting: Cardiovascular Disease

## 2017-05-11 ENCOUNTER — Encounter: Payer: Self-pay | Admitting: Cardiovascular Disease

## 2017-05-11 VITALS — BP 112/70 | HR 56 | Ht 65.0 in | Wt 159.2 lb

## 2017-05-11 DIAGNOSIS — R9431 Abnormal electrocardiogram [ECG] [EKG]: Secondary | ICD-10-CM

## 2017-05-11 DIAGNOSIS — R0602 Shortness of breath: Secondary | ICD-10-CM

## 2017-05-11 DIAGNOSIS — E785 Hyperlipidemia, unspecified: Secondary | ICD-10-CM

## 2017-05-11 DIAGNOSIS — Z01812 Encounter for preprocedural laboratory examination: Secondary | ICD-10-CM | POA: Diagnosis not present

## 2017-05-11 DIAGNOSIS — E7800 Pure hypercholesterolemia, unspecified: Secondary | ICD-10-CM | POA: Diagnosis not present

## 2017-05-11 HISTORY — DX: Hyperlipidemia, unspecified: E78.5

## 2017-05-11 LAB — BASIC METABOLIC PANEL
BUN / CREAT RATIO: 31 — AB (ref 12–28)
BUN: 22 mg/dL (ref 8–27)
CO2: 24 mmol/L (ref 20–29)
Calcium: 9.1 mg/dL (ref 8.7–10.3)
Chloride: 105 mmol/L (ref 96–106)
Creatinine, Ser: 0.72 mg/dL (ref 0.57–1.00)
GFR calc Af Amer: 95 mL/min/{1.73_m2} (ref >59)
GFR, EST NON AFRICAN AMERICAN: 82 mL/min/{1.73_m2} (ref >59)
GLUCOSE: 77 mg/dL (ref 65–99)
POTASSIUM: 4.2 mmol/L (ref 3.5–5.2)
SODIUM: 142 mmol/L (ref 134–144)

## 2017-05-11 NOTE — Patient Instructions (Signed)
Medication Instructions:  Your physician recommends that you continue on your current medications as directed. Please refer to the Current Medication list given to you today.  Labwork: BMET TODAY   Testing/Procedures: Your physician has requested that you have cardiac CT. Cardiac computed tomography (CT) is a painless test that uses an x-ray machine to take clear, detailed pictures of your heart. For further information please visit [email or web address]. Please follow instruction sheet as given.  Follow-Up: Your physician recommends that you schedule a follow-up appointment in: 1 MONTH OV   Cardiac CT Angiogram A cardiac CT angiogram is a procedure to look at the heart and the area around the heart. It may be done to help find the cause of chest pains or other symptoms of heart disease. During this procedure, a large X-ray machine, called a CT scanner, takes detailed pictures of the heart and the surrounding area after a dye (contrast material) has been injected into blood vessels in the area. The procedure is also sometimes called a coronary CT angiogram, coronary artery scanning, or CTA. A cardiac CT angiogram allows the health care provider to see how well blood is flowing to and from the heart. The health care provider will be able to see if there are any problems, such as:  Blockage or narrowing of the coronary arteries in the heart.  Fluid around the heart.  Signs of weakness or disease in the muscles, valves, and tissues of the heart.  Tell a health care provider about:  Any allergies you have. This is especially important if you have had a previous allergic reaction to contrast dye.  All medicines you are taking, including vitamins, herbs, eye drops, creams, and over-the-counter medicines.  Any blood disorders you have.  Any surgeries you have had.  Any medical conditions you have.  Whether you are pregnant or may be pregnant.  Any anxiety disorders, chronic pain, or other  conditions you have that may increase your stress or prevent you from lying still. What are the risks? Generally, this is a safe procedure. However, problems may occur, including:  Bleeding.  Infection.  Allergic reactions to medicines or dyes.  Damage to other structures or organs.  Kidney damage from the dye or contrast that is used.  Increased risk of cancer from radiation exposure. This risk is low. Talk with your health care provider about: ? The risks and benefits of testing. ? How you can receive the lowest dose of radiation.  What happens before the procedure?  Wear comfortable clothing and remove any jewelry, glasses, dentures, and hearing aids.  Follow instructions from your health care provider about eating and drinking. This may include: ? For 12 hours before the test - avoid caffeine. This includes tea, coffee, soda, energy drinks, and diet pills. Drink plenty of water or other fluids that do not have caffeine in them. Being well-hydrated can prevent complications. ? For 4-6 hours before the test - stop eating and drinking. The contrast dye can cause nausea, but this is less likely if your stomach is empty.  Ask your health care provider about changing or stopping your regular medicines. This is especially important if you are taking diabetes medicines, blood thinners, or medicines to treat erectile dysfunction. What happens during the procedure?  Hair on your chest may need to be removed so that small sticky patches called electrodes can be placed on your chest. These will transmit information that helps to monitor your heart during the test.  An IV tube will be  inserted into one of your veins.  You might be given a medicine to control your heart rate during the test. This will help to ensure that good images are obtained.  You will be asked to lie on an exam table. This table will slide in and out of the CT machine during the procedure.  Contrast dye will be  injected into the IV tube. You might feel warm, or you may get a metallic taste in your mouth.  You will be given a medicine (nitroglycerin) to relax (dilate) the arteries in your heart.  The table that you are lying on will move into the CT machine tunnel for the scan.  The person running the machine will give you instructions while the scans are being done. You may be asked to: ? Keep your arms above your head. ? Hold your breath. ? Stay very still, even if the table is moving.  When the scanning is complete, you will be moved out of the machine.  The IV tube will be removed. The procedure may vary among health care providers and hospitals. What happens after the procedure?  You might feel warm, or you may get a metallic taste in your mouth from the contrast dye.  You may have a headache from the nitroglycerin.  After the procedure, drink water or other fluids to wash (flush) the contrast material out of your body.  Contact a health care provider if you have any symptoms of allergy to the contrast. These symptoms include: ? Shortness of breath. ? Rash or hives. ? A racing heartbeat.  Most people can return to their normal activities right after the procedure. Ask your health care provider what activities are safe for you.  It is up to you to get the results of your procedure. Ask your health care provider, or the department that is doing the procedure, when your results will be ready. Summary  A cardiac CT angiogram is a procedure to look at the heart and the area around the heart. It may be done to help find the cause of chest pains or other symptoms of heart disease.  During this procedure, a large X-ray machine, called a CT scanner, takes detailed pictures of the heart and the surrounding area after a dye (contrast material) has been injected into blood vessels in the area.  Ask your health care provider about changing or stopping your regular medicines before the procedure.  This is especially important if you are taking diabetes medicines, blood thinners, or medicines to treat erectile dysfunction.  After the procedure, drink water or other fluids to wash (flush) the contrast material out of your body. This information is not intended to replace advice given to you by your health care provider. Make sure you discuss any questions you have with your health care provider. Document Released: 08/04/2008 Document Revised: 07/11/2016 Document Reviewed: 07/11/2016 Elsevier Interactive Patient Education  2017 [employer].

## 2017-05-11 NOTE — Progress Notes (Signed)
Cardiology Office Note   Date:  05/11/2017   ID:  Julie [name], DOB 06/19/1941, MRN [id number]  PCP:  Velna Hatchet, MD  Cardiologist:   Skeet Latch, MD   No chief complaint on file.     History of Present Illness: Julie [name] is a 76 y.o. female with hypothyroidism, mild COPD, TIA, hyperlipidemia, Celiac disease, and skin cancer who presents for evaluation of an abnormal EKG. Ms. Guardiola saw her PCP 04/11/17 and was noted to have T-wave inversions in the anterolateral leads. She had previously stopped her statin due to concern of memory loss. She was referred to cardiology for further evaluation.  She has been feeling generally well.  She had an episode of chest pain in July that occurred in the setting of a stressful event.  She denies exertional chest pain but has experienced exertional dyspnea. She typically notes this when walking up an incline. She golfs regularly and does do water aerobics without any symptoms. 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 She denies any lower extremity edema, orthopnea, or PND.   Past Medical History:  Diagnosis Date  . Asthma   . Cancer ([place])   . Celiac disease   . Hyperlipidemia 05/11/2017  . Hypertension   . Polycythemia   . Thyroid disease   . TIA (transient ischemic attack)    2017    Past Surgical History:  Procedure Laterality Date  . ABDOMINAL HYSTERECTOMY    . BLADDER SURGERY       Current Outpatient Prescriptions  Medication Sig Dispense Refill  . atorvastatin (LIPITOR) 10 MG tablet Take 1 tablet by mouth daily.    . beclomethasone (QVAR) 80 MCG/ACT inhaler Inhale 1 puff into the lungs as needed.     . cholestyramine (QUESTRAN) 4 g packet TAKE 1 PACKET as needed. NO OTHER MEDICATION 2 HOURS BEFORE OR AFTER.  1  . cyanocobalamin (,VITAMIN B-12,) 1000 MCG/ML injection Inject 1 mL into the skin every 21 ( twenty-one) days.  1  . levothyroxine (SYNTHROID, LEVOTHROID) 125 MCG tablet Take 1 tablet (125 mcg total) by mouth  every other day. 30 tablet 0  . Multiple Vitamins-Minerals (MULTIVITAMIN WITH MINERALS) tablet Take 1 tablet by mouth daily.    . Probiotic Product (PRO-BIOTIC BLEND PO) Take 1 tablet by mouth daily.     No current facility-administered medications for this visit.     Allergies:   Sulfa antibiotics; Demerol [meperidine]; and Morphine and related    Social History:  The patient  reports that she has never smoked. She has never used smokeless tobacco. She reports that she drinks alcohol. She reports that she does not use drugs.   Family History:  The patient's family history includes Dementia in her father; Peripheral Artery Disease in her mother.    ROS:  Please see the history of present illness.   Otherwise, review of systems are positive for R knee pain/swelling.   All other systems are reviewed and negative.    PHYSICAL EXAM: VS:  BP 112/70 (BP Location: Left Arm, Patient Position: Sitting, Cuff Size: Normal)   Pulse (!) 56   Ht 5' 5"  (1.651 m)   Wt 72.2 kg (159 lb 3.2 oz)   BMI 26.49 kg/m  , BMI Body mass index is 26.49 kg/m. GENERAL:  Well appearing HEENT:  Pupils equal round and reactive, fundi not visualized, oral mucosa unremarkable NECK:  No jugular venous distention, waveform within normal limits, carotid upstroke brisk and symmetric, no  bruits, no thyromegaly LUNGS:  Clear to auscultation bilaterally HEART:  RRR.  PMI not displaced or sustained,S1 and S2 within normal limits, no S3, no S4, no clicks, no rubs, no murmurs ABD:  Flat, positive bowel sounds normal in frequency in pitch, no bruits, no rebound, no guarding, no midline pulsatile mass, no hepatomegaly, no splenomegaly EXT:  2 plus pulses throughout, no edema, no cyanosis no clubbing SKIN:  No rashes no nodules NEURO:  Cranial nerves II through XII grossly intact, motor grossly intact throughout PSYCH:  Cognitively intact, oriented to person place and time   EKG:  EKG is ordered today. The ekg ordered today  demonstrates Sinus bradycardia. Rate 56 bpm. The axis deviation. 04/11/17: Sinus bradycardia. Rate 57 bpm. Left axis deviation. Anterior T-wave inversions cannot rule out prior septal infarct 10/05/15: Sinus bradycardia. Rate 52 bpm. Cannot rule out prior septal infarct  Recent Labs: No results found for requested labs within last 8760 hours.    Lipid Panel    Component Value Date/Time   CHOL 235 (H) 10/05/2015 1349   TRIG 222 (H) 10/05/2015 1349   HDL 49 10/05/2015 1349   CHOLHDL 4.8 10/05/2015 1349   VLDL 44 (H) 10/05/2015 1349   LDLCALC 142 (H) 10/05/2015 1349   04/03/17:  Total cholesterol 235, triglycerides 141, HDL 49, LDL 158 TSH 0.82, free T4 1 41 sodium 141, potassium 4.6, BUN 17, creatinine 0.8 AST 15, ALT 50 WBC 4.82, hemoglobin 18.1, hematocrit 45.8, platelets 260    Wt Readings from Last 3 Encounters:  05/11/17 72.2 kg (159 lb 3.2 oz)  03/30/17 71.2 kg (157 lb)  01/19/17 70.8 kg (156 lb)      ASSESSMENT AND PLAN:  # Shortness of breath: # Abnormal EKG: # Hyperlipidemia: Ms. Dicamillo has exertional dyspnea but no chest pain. Her EKG before was concerning for ischemia with anterior T wave inversions.  Today it is normal.  She has struggled with statin intolerance.  We will get a coronary CT-A to assess for obstructive CAD and to also determine her need for high intensity lipid lowering therapy.    Current medicines are reviewed at length with the patient today.  The patient does not have concerns regarding medicines.  The following changes have been made:  no change  Labs/ tests ordered today include:   Orders Placed This Encounter  Procedures  . CT CORONARY MORPH W/CTA COR W/SCORE W/CA W/CM &/OR WO/CM  . CT CORONARY FRACTIONAL FLOW RESERVE DATA PREP  . CT CORONARY FRACTIONAL FLOW RESERVE FLUID ANALYSIS  . Basic metabolic panel  . EKG 12-Lead     Disposition:   FU with Cornell Bourbon C. Oval Linsey, MD, [facility] in 1 month.     This note was written with the  assistance of speech recognition software.  Please excuse any transcriptional errors.  Signed, Earnestine Tuohey C. Oval Linsey, MD, [facility]  05/11/2017 11:23 AM    [place]

## 2017-05-19 ENCOUNTER — Telehealth: Payer: Self-pay

## 2017-05-19 NOTE — Telephone Encounter (Signed)
Received call from patient she was to see if cardiac ct has been precerted yet.Stated she has a follow up appointment with Dr.[place] 10/2 and needs done before then.Advised it takes several weeks to have precerted,once it is precerted you will get a call to schedule.I will send another message to precert pool.

## 2017-05-25 ENCOUNTER — Encounter: Payer: Self-pay | Admitting: Cardiovascular Disease

## 2017-05-29 ENCOUNTER — Ambulatory Visit (HOSPITAL_COMMUNITY)
Admission: RE | Admit: 2017-05-29 | Discharge: 2017-05-29 | Disposition: A | Discharge status: Home / Self Care | Payer: Medicare Other | Source: Ambulatory Visit | Attending: Cardiovascular Disease | Admitting: Cardiovascular Disease

## 2017-05-29 ENCOUNTER — Ambulatory Visit (HOSPITAL_COMMUNITY): Admission: RE | Admit: 2017-05-29 | Payer: Medicare Other | Source: Ambulatory Visit

## 2017-05-29 DIAGNOSIS — I700 Atherosclerosis of aorta: Secondary | ICD-10-CM | POA: Diagnosis not present

## 2017-05-29 DIAGNOSIS — R9431 Abnormal electrocardiogram [ECG] [EKG]: Secondary | ICD-10-CM | POA: Insufficient documentation

## 2017-05-29 DIAGNOSIS — R0602 Shortness of breath: Secondary | ICD-10-CM

## 2017-05-29 DIAGNOSIS — I2510 Atherosclerotic heart disease of native coronary artery without angina pectoris: Secondary | ICD-10-CM | POA: Diagnosis not present

## 2017-05-29 MED ORDER — IOPAMIDOL (ISOVUE-370) INJECTION 76%
INTRAVENOUS | Status: AC
  Administered 2017-05-29: 100 mL
  Filled 2017-05-29: qty 100

## 2017-05-29 MED ORDER — NITROGLYCERIN 0.4 MG SL SUBL
SUBLINGUAL_TABLET | SUBLINGUAL | Status: AC
  Filled 2017-05-29: qty 1

## 2017-06-06 ENCOUNTER — Ambulatory Visit (INDEPENDENT_AMBULATORY_CARE_PROVIDER_SITE_OTHER): Payer: Medicare Other | Admitting: Cardiovascular Disease

## 2017-06-06 ENCOUNTER — Encounter: Payer: Self-pay | Admitting: Cardiovascular Disease

## 2017-06-06 VITALS — BP 124/76 | HR 54 | Ht 65.0 in | Wt 161.6 lb

## 2017-06-06 DIAGNOSIS — Z5181 Encounter for therapeutic drug level monitoring: Secondary | ICD-10-CM | POA: Diagnosis not present

## 2017-06-06 DIAGNOSIS — I2510 Atherosclerotic heart disease of native coronary artery without angina pectoris: Secondary | ICD-10-CM | POA: Diagnosis not present

## 2017-06-06 DIAGNOSIS — E785 Hyperlipidemia, unspecified: Secondary | ICD-10-CM | POA: Diagnosis not present

## 2017-06-06 HISTORY — DX: Atherosclerotic heart disease of native coronary artery without angina pectoris: I25.10

## 2017-06-06 MED ORDER — ATORVASTATIN CALCIUM 20 MG PO TABS: 20.0000 mg | ORAL_TABLET | Freq: Every day | ORAL | 3 refills | Status: DC

## 2017-06-06 NOTE — Patient Instructions (Signed)
Medication Instructions:  INCREASE ATORVASTATIN TO 20 MG DAILY   Labwork: FASTING LP/CMET IN 6 WEEKS  Testing/Procedures: NONE  Follow-Up: Your physician wants you to follow-up in: 3 MONTH OV You will receive a reminder letter in the mail two months in advance. If you don't receive a letter, please call our office to schedule the follow-up appointment.  If you need a refill on your cardiac medications before your next appointment, please call your pharmacy.

## 2017-06-06 NOTE — Progress Notes (Signed)
Cardiology Office Note   Date:  06/06/2017   ID:  RAEVYN SOKOL, DOB 1940/11/24, MRN [id number]  PCP:  Velna Hatchet, MD  Cardiologist:   Skeet Latch, MD   Chief Complaint  Patient presents with  . Follow-up    4 weeks; Pt states no Sx      History of Present Illness: Julie [name] is a 76 y.o. female with hypothyroidism, mild COPD, TIA, hyperlipidemia, Celiac disease, and skin cancer who presents for follow up.  She was initially seen 05/2017 for the evaluation of an abnormal EKG. Julie [name] saw her PCP 04/11/17 and was noted to have T-wave inversions in the anterolateral leads. She had previously stopped her statin due to concern of memory loss. She was feeling generally well but had an episode of chest pain 03/2017 that occurred in the setting of a stressful event.  She deniee exertional chest pain but has experienced exertional dyspnea when walking up hills.  She was referred for a coronary CT-A that showed mild plaque in the proximal LAD and D1.  There was no obstructive disease.  Since her last appontment Julie [name] has been doing well. She has no chest pain or shortness of breath. She has had some discomfort from her knee and knee. She is able to still played golf with a brace. She has no lower extremity edema, orthopnea, or PND. She and her partner note she has suffered from some memory loss. In the past they thought that her statin may be contributing. However it did not change after discontinuing it. She continues to be very physically active.   Past Medical History:  Diagnosis Date  . Asthma   . CAD in native artery 06/06/2017   Mild LAD disease on coronary CT-A 05/2017.  . Cancer ([place])   . Celiac disease   . Hyperlipidemia 05/11/2017  . Hypertension   . Polycythemia   . Thyroid disease   . TIA (transient ischemic attack)    2017    Past Surgical History:  Procedure Laterality Date  . ABDOMINAL HYSTERECTOMY    . BLADDER SURGERY       Current Outpatient  Prescriptions  Medication Sig Dispense Refill  . aspirin EC 81 MG tablet Take 81 mg by mouth daily.    Marland Kitchen atorvastatin (LIPITOR) 20 MG tablet Take 1 tablet (20 mg total) by mouth daily. 90 tablet 3  . beclomethasone (QVAR) 80 MCG/ACT inhaler Inhale 1 puff into the lungs as needed.     . cholecalciferol (VITAMIN D) 1000 units tablet Take 1,000 Units by mouth daily.    . cholestyramine (QUESTRAN) 4 g packet TAKE 1 PACKET as needed. NO OTHER MEDICATION 2 HOURS BEFORE OR AFTER.  1  . cyanocobalamin (,VITAMIN B-12,) 1000 MCG/ML injection Inject 1 mL into the skin every 21 ( twenty-one) days.  1  . levothyroxine (SYNTHROID, LEVOTHROID) 125 MCG tablet Take 1 tablet (125 mcg total) by mouth every other day. 30 tablet 0  . Multiple Vitamins-Minerals (MULTIVITAMIN WITH MINERALS) tablet Take 1 tablet by mouth daily.    . Probiotic Product (PRO-BIOTIC BLEND PO) Take 1 tablet by mouth daily.     No current facility-administered medications for this visit.     Allergies:   Propantheline; Sulfa antibiotics; Demerol [meperidine]; and Morphine and related    Social History:  The patient  reports that she has never smoked. She has never used smokeless tobacco. She reports that she drinks alcohol. She reports that she does not use  drugs.   Family History:  The patient's family history includes Dementia in her father; Peripheral Artery Disease in her mother.    ROS:  Please see the history of present illness.   Otherwise, review of systems are positive for R knee pain/swelling.   All other systems are reviewed and negative.    PHYSICAL EXAM: VS:  BP 124/76   Pulse (!) 54   Ht 5' 5"  (1.651 m)   Wt 73.3 kg (161 lb 9.6 oz)   BMI 26.89 kg/m  , BMI Body mass index is 26.89 kg/m. GENERAL:  Well appearing HEENT: Pupils equal round and reactive, fundi not visualized, oral mucosa unremarkable NECK:  No jugular venous distention, waveform within normal limits, carotid upstroke brisk and symmetric, no bruits, no  thyromegaly LYMPHATICS:  No cervical adenopathy LUNGS:  Clear to auscultation bilaterally HEART:  RRR.  PMI not displaced or sustained,S1 and S2 within normal limits, no S3, no S4, no clicks, no rubs, no murmurs ABD:  Flat, positive bowel sounds normal in frequency in pitch, no bruits, no rebound, no guarding, no midline pulsatile mass, no hepatomegaly, no splenomegaly EXT:  2 plus pulses throughout, no edema, no cyanosis no clubbing SKIN:  No rashes no nodules NEURO:  Cranial nerves II through XII grossly intact, motor grossly intact throughout PSYCH:  Cognitively intact, oriented to person place and time    EKG:  EKG is not ordered today. The ekg ordered today demonstrates Sinus bradycardia. Rate 56 bpm. The axis deviation. 04/11/17: Sinus bradycardia. Rate 57 bpm. Left axis deviation. Anterior T-wave inversions cannot rule out prior septal infarct 10/05/15: Sinus bradycardia. Rate 52 bpm. Cannot rule out prior septal infarct  Coronary CT-A 05/29/17:  Coronary Arteries: Right dominant with no anomalies  LM:  No plaque or stenosis. LAD system: Mixed plaque without significant stenosis ostial LAD. There was an area of soft plaque in the proximal LAD with mild stenosis. Small to moderate D1 with mixed plaque, no significant stenosis proximally. Moderate D2 without significant disease. Circumflex system: Small ramus, no significant disease. No plaque or stenosis in the LCx system. RCA system:  No plaque or stenosis.  Recent Labs: 05/11/2017: BUN 22; Creatinine, Ser 0.72; Potassium 4.2; Sodium 142    Lipid Panel    Component Value Date/Time   CHOL 235 (H) 10/05/2015 1349   TRIG 222 (H) 10/05/2015 1349   HDL 49 10/05/2015 1349   CHOLHDL 4.8 10/05/2015 1349   VLDL 44 (H) 10/05/2015 1349   LDLCALC 142 (H) 10/05/2015 1349   04/03/17:  Total cholesterol 235, triglycerides 141, HDL 49, LDL 158 TSH 0.82, free T4 1 41 sodium 141, potassium 4.6, BUN 17, creatinine 0.8 AST 15, ALT 50 WBC  4.82, hemoglobin 18.1, hematocrit 45.8, platelets 260    Wt Readings from Last 3 Encounters:  06/06/17 73.3 kg (161 lb 9.6 oz)  05/11/17 72.2 kg (159 lb 3.2 oz)  03/30/17 71.2 kg (157 lb)      ASSESSMENT AND PLAN:  # Non-obstructive CAD: # TIA: # Hyperlipidemia: Julie [name] hasn't tolerated higher doses of statins in the past due to myalgias. We will try increasing her atorvastatin 20 mg daily. She will repeat lipids and a CMP in 6 weeks. If she does not tolerate higher doses or we cannot get her to goal (LDL less than 70), then we will refer her to the lipid clinic for consideration of a PCSK9 inhibitor.  Started aspirin 59m daily.  # Memory loss: Ms. LBocchinopartner noted that she  has been struggling with memory loss. She had difficulty remembering the instructions in clinic today.  We will watch this closely while increasing her statin.     Current medicines are reviewed at length with the patient today.  The patient does not have concerns regarding medicines.  The following changes have been made:  Increase atorvastatin to 76m.  Aspirin 843mdaily.  Labs/ tests ordered today include:   Orders Placed This Encounter  Procedures  . Lipid panel  . Comprehensive metabolic panel     Disposition:   FU with Julie Mabry C. RaOval LinseyMD, FA[facility] 3 months.     This note was written with the assistance of speech recognition software.  Please excuse any transcriptional errors.  Signed, Simone Rodenbeck C. RaOval LinseyMD, FASanford [facility] Center10/10/2016 1:10 PM    [facility]

## 2017-07-20 LAB — LIPID PANEL
CHOLESTEROL TOTAL: 167 mg/dL (ref 100–199)
Chol/HDL Ratio: 3.1 ratio (ref 0.0–4.4)
HDL: 54 mg/dL (ref >39)
LDL CALC: 85 mg/dL (ref 0–99)
TRIGLYCERIDES: 139 mg/dL (ref 0–149)
VLDL CHOLESTEROL CAL: 28 mg/dL (ref 5–40)

## 2017-07-20 LAB — COMPREHENSIVE METABOLIC PANEL
A/G RATIO: 2.2 (ref 1.2–2.2)
ALK PHOS: 71 IU/L (ref 39–117)
ALT: 22 IU/L (ref 0–32)
AST: 21 IU/L (ref 0–40)
Albumin: 4.2 g/dL (ref 3.5–4.8)
BUN/Creatinine Ratio: 20 (ref 12–28)
BUN: 13 mg/dL (ref 8–27)
Bilirubin Total: 0.8 mg/dL (ref 0.0–1.2)
CO2: 24 mmol/L (ref 20–29)
Calcium: 9.1 mg/dL (ref 8.7–10.3)
Chloride: 104 mmol/L (ref 96–106)
Creatinine, Ser: 0.66 mg/dL (ref 0.57–1.00)
GFR calc Af Amer: 100 mL/min/{1.73_m2} (ref >59)
GFR calc non Af Amer: 87 mL/min/{1.73_m2} (ref >59)
Globulin, Total: 1.9 g/dL (ref 1.5–4.5)
Glucose: 92 mg/dL (ref 65–99)
POTASSIUM: 5.1 mmol/L (ref 3.5–5.2)
Sodium: 143 mmol/L (ref 134–144)
Total Protein: 6.1 g/dL (ref 6.0–8.5)

## 2017-08-03 ENCOUNTER — Telehealth: Payer: Self-pay | Admitting: *Deleted

## 2017-08-03 MED ORDER — EZETIMIBE 10 MG PO TABS: 10.0000 mg | ORAL_TABLET | Freq: Every day | ORAL | 5 refills | Status: DC

## 2017-08-03 NOTE — Telephone Encounter (Signed)
Advised patient of recommendations.  

## 2017-08-03 NOTE — Telephone Encounter (Signed)
-----   Message from Skeet Latch, MD sent at 08/01/2017  8:42 AM EST ----- Cholesterol levels are much better. Liver and kidney function stable.  No changes recommended.

## 2017-08-03 NOTE — Telephone Encounter (Signed)
Advised patient of lab results  Patient stated that since increasing her Atorvastatin to 20 mg daily she has been having more aches in hip area and stiffer in am. Discussed with Dr Oval Linsey and recommends decreasing back to 10 mg daily and adding Zetia 10 mg daily. Recheck labs at follow up. Left message to call back

## 2017-08-31 ENCOUNTER — Encounter: Payer: Self-pay | Admitting: *Deleted

## 2017-09-08 ENCOUNTER — Encounter: Payer: Self-pay | Admitting: Cardiovascular Disease

## 2017-09-08 ENCOUNTER — Ambulatory Visit (INDEPENDENT_AMBULATORY_CARE_PROVIDER_SITE_OTHER): Payer: Medicare Other | Admitting: Cardiovascular Disease

## 2017-09-08 VITALS — BP 129/78 | HR 58 | Ht 65.0 in | Wt 164.4 lb

## 2017-09-08 DIAGNOSIS — I2510 Atherosclerotic heart disease of native coronary artery without angina pectoris: Secondary | ICD-10-CM

## 2017-09-08 DIAGNOSIS — E785 Hyperlipidemia, unspecified: Secondary | ICD-10-CM

## 2017-09-08 NOTE — Progress Notes (Signed)
Cardiology Office Note   Date:  09/08/2017   ID:  Julie [name], DOB [birth year], MRN [id number]  PCP:  Velna Hatchet, MD  Cardiologist:   Skeet Latch, MD   Chief Complaint  Patient presents with  . Follow-up    3 months;     History of Present Illness: Julie [name] is a 77 y.o. female with hypothyroidism, mild COPD, TIA, hyperlipidemia, Celiac disease, and skin cancer who presents for follow up.  She was initially seen 05/2017 for the evaluation of an abnormal EKG. Ms. Kenney saw her PCP 04/11/17 and was noted to have T-wave inversions in the anterolateral leads. She had previously stopped her statin due to concern of memory loss. She was feeling generally well but had an episode of chest pain 03/2017 that occurred in the setting of a stressful event.  She deniee exertional chest pain but has experienced exertional dyspnea when walking up hills.  She was referred for a coronary CT-A that showed mild plaque in the proximal LAD and D1.  There was no obstructive disease.  At her last appointment Ms. Dutil's atorvastatin was increased due to LDL not at goal.  There was significant improvement in her LDL, but she developed myalgia.  Atorvastatin was reduced back to the previous dose and Zetia was added.  Since that appointment she has been feeling poorly.  She feels like she is 77 years old.  Her muscles ache and is hard for her to get up and move around in the mornings.  It improves somewhat throughout the day.  She has no chest pain or shortness of breath.  She continues to swim 3 days/week at the pool.  She has no exertional symptoms.  She has not noted any lower extremity edema, orthopnea, or PND.   Past Medical History:  Diagnosis Date  . Asthma   . CAD in native artery 06/06/2017   Mild LAD disease on coronary CT-A 05/2017.  . Cancer ([place])   . Celiac disease   . Hyperlipidemia 05/11/2017  . Hypertension   . Polycythemia   . Thyroid disease   . TIA (transient ischemic attack)    2017     Past Surgical History:  Procedure Laterality Date  . ABDOMINAL HYSTERECTOMY    . BLADDER SURGERY       Current Outpatient Medications  Medication Sig Dispense Refill  . aspirin EC 81 MG tablet Take 81 mg by mouth daily.    . beclomethasone (QVAR) 80 MCG/ACT inhaler Inhale 1 puff into the lungs as needed.     . cholecalciferol (VITAMIN D) 1000 units tablet Take 1,000 Units by mouth daily.    . cholestyramine (QUESTRAN) 4 g packet TAKE 1 PACKET as needed. NO OTHER MEDICATION 2 HOURS BEFORE OR AFTER.  1  . cyanocobalamin (,VITAMIN B-12,) 1000 MCG/ML injection Inject 1 mL into the skin every 21 ( twenty-one) days.  1  . levothyroxine (SYNTHROID, LEVOTHROID) 125 MCG tablet Take 1 tablet (125 mcg total) by mouth every other day. 30 tablet 0  . Multiple Vitamins-Minerals (MULTIVITAMIN WITH MINERALS) tablet Take 1 tablet by mouth daily.    . Probiotic Product (PRO-BIOTIC BLEND PO) Take 1 tablet by mouth daily.     No current facility-administered medications for this visit.     Allergies:   Propantheline; Atorvastatin; Sulfa antibiotics; Zetia [ezetimibe]; Demerol [meperidine]; and Morphine and related    Social History:  The patient  reports that  has never smoked. she has never used smokeless  tobacco. She reports that she drinks alcohol. She reports that she does not use drugs.   Family History:  The patient's family history includes Dementia in her father; Peripheral Artery Disease in her mother.    ROS:  Please see the history of present illness.   Otherwise, review of systems are positive for R knee pain/swelling.   All other systems are reviewed and negative.    PHYSICAL EXAM: VS:  BP 129/78   Pulse (!) 58   Ht 5' 5"  (1.651 m)   Wt 164 lb 6.4 oz (74.6 kg)   BMI 27.36 kg/m  , BMI Body mass index is 27.36 kg/m. GENERAL:  Well appearing HEENT: Pupils equal round and reactive, fundi not visualized, oral mucosa unremarkable NECK:  No jugular venous distention, waveform within  normal limits, carotid upstroke brisk and symmetric, no bruits, no thyromegaly LUNGS:  Clear to auscultation bilaterally HEART:  RRR.  PMI not displaced or sustained,S1 and S2 within normal limits, no S3, no S4, no clicks, no rubs, no murmurs ABD:  Flat, positive bowel sounds normal in frequency in pitch, no bruits, no rebound, no guarding, no midline pulsatile mass, no hepatomegaly, no splenomegaly EXT:  2 plus pulses throughout, no edema, no cyanosis no clubbing SKIN:  No rashes no nodules NEURO:  Cranial nerves II through XII grossly intact, motor grossly intact throughout PSYCH:  Cognitively intact, oriented to person place and time   EKG:  EKG is not ordered today. The ekg ordered today demonstrates Sinus bradycardia. Rate 56 bpm. The axis deviation. 04/11/17: Sinus bradycardia. Rate 57 bpm. Left axis deviation. Anterior T-wave inversions cannot rule out prior septal infarct 10/05/15: Sinus bradycardia. Rate 52 bpm. Cannot rule out prior septal infarct  Coronary CT-A 05/29/17:  Coronary Arteries: Right dominant with no anomalies  LM:  No plaque or stenosis. LAD system: Mixed plaque without significant stenosis ostial LAD. There was an area of soft plaque in the proximal LAD with mild stenosis. Small to moderate D1 with mixed plaque, no significant stenosis proximally. Moderate D2 without significant disease. Circumflex system: Small ramus, no significant disease. No plaque or stenosis in the LCx system. RCA system:  No plaque or stenosis.  Recent Labs: 07/20/2017: ALT 22; BUN 13; Creatinine, Ser 0.66; Potassium 5.1; Sodium 143    Lipid Panel    Component Value Date/Time   CHOL 167 07/20/2017 0858   TRIG 139 07/20/2017 0858   HDL 54 07/20/2017 0858   CHOLHDL 3.1 07/20/2017 0858   CHOLHDL 4.8 10/05/2015 1349   VLDL 44 (H) 10/05/2015 1349   LDLCALC 85 07/20/2017 0858   04/03/17:  Total cholesterol 235, triglycerides 141, HDL 49, LDL 158 TSH 0.82, free T4 1 41 sodium 141,  potassium 4.6, BUN 17, creatinine 0.8 AST 15, ALT 50 WBC 4.82, hemoglobin 18.1, hematocrit 45.8, platelets 260    Wt Readings from Last 3 Encounters:  09/08/17 164 lb 6.4 oz (74.6 kg)  06/06/17 161 lb 9.6 oz (73.3 kg)  05/11/17 159 lb 3.2 oz (72.2 kg)      ASSESSMENT AND PLAN:  # Non-obstructive CAD: # TIA: # Hyperlipidemia: Ms. Otte hasn't tolerated higher doses of statins in the past due to myalgias. She has tried several, most recently atorvastatin.  She also has some memory loss which may be associated with the statin.  We will stop both atorvastatin and ezetimibe and refer her to our pharmacists to try either Repatha or Praluent.      Current medicines are reviewed at length  with the patient today.  The patient does not have concerns regarding medicines.  The following changes have been made: stop atorvastatin and ezetimibe.  Labs/ tests ordered today include:   No orders of the defined types were placed in this encounter.    Disposition:   FU with Kash Mothershead C. Oval Linsey, MD, [facility] in 6 months.    This note was written with the assistance of speech recognition software.  Please excuse any transcriptional errors.  Signed, Jailine Lieder C. Oval Linsey, MD, [facility]  09/08/2017 12:40 PM    [place]

## 2017-09-08 NOTE — Patient Instructions (Addendum)
  Medication Instructions:  STOP ATORVASTATIN  STOP ZETIA  Labwork: NONE  Testing/Procedures: NONE  Follow-Up: Your physician recommends that you schedule a follow-up appointment JZ:[id number] WITH PHARM D FOR LIPID CLINIC   Your physician wants you to follow-up in: 6 month ov You will receive a reminder letter in the mail two months in advance. If you don't receive a letter, please call our office to schedule the follow-up appointment.  If you need a refill on your cardiac medications before your next appointment, please call your pharmacy.

## 2017-09-24 NOTE — Progress Notes (Signed)
[name] [address] [place], [place] [address] Phone: [phone] Subjective:      CC: Thumb pain follow-up  ION:[id number]  Julie [name] is a 77 y.o. female coming in with complaint of thumb pain.  Found to have [place] arthritis.  Injected greater than 8 months ago.  Patient states that her left hand is bothering her. She has had an increase in oain for the past 2 weeks. She states that seh is unable to open a bottle of water.      Past Medical History:  Diagnosis Date  . Asthma   . CAD in native artery 06/06/2017   Mild LAD disease on coronary CT-A 05/2017.  . Cancer ([place])   . Celiac disease   . Hyperlipidemia 05/11/2017  . Hypertension   . Polycythemia   . Thyroid disease   . TIA (transient ischemic attack)    2017   Past Surgical History:  Procedure Laterality Date  . ABDOMINAL HYSTERECTOMY    . BLADDER SURGERY     Social History   Socioeconomic History  . Marital status: Single    Spouse name: None  . Number of children: None  . Years of education: None  . Highest education level: None  Social Needs  . Financial resource strain: None  . Food insecurity - worry: None  . Food insecurity - inability: None  . Transportation needs - medical: None  . Transportation needs - non-medical: None  Occupational History  . None  Tobacco Use  . Smoking status: Never Smoker  . Smokeless tobacco: Never Used  Substance and Sexual Activity  . Alcohol use: Yes    Comment: daily  . Drug use: No  . Sexual activity: None  Other Topics Concern  . None  Social History Narrative  . None   Allergies  Allergen Reactions  . Propantheline Rash    Cannot remember other symptoms   . Atorvastatin     Myalgia   . Sulfa Antibiotics Swelling  . Zetia [Ezetimibe]     Myalgias   . Demerol [Meperidine] Rash  . Morphine And Related Rash   Family History  Problem Relation Age of Onset  . Peripheral Artery Disease Mother   . Dementia Father       Past medical history, social, surgical and family history all reviewed in electronic medical record.  No pertanent information unless stated regarding to the chief complaint.   Review of Systems:Review of systems updated and as accurate as of 09/25/17  No headache, visual changes, nausea, vomiting, diarrhea, constipation, dizziness, abdominal pain, skin rash, fevers, chills, night sweats, weight loss, swollen lymph nodes, body aches,  muscle aches, chest pain, shortness of breath, mood changes.  Positive joint swelling  Objective  Blood pressure 140/82, pulse (!) 56, height 5' 5"  (1.651 m), weight 163 lb (73.9 kg), SpO2 97 %. Systems examined below as of 09/25/17   General: No apparent distress alert and oriented x3 mood and affect normal, dressed appropriately.  HEENT: Pupils equal, extraocular movements intact  Respiratory: Patient's speak in full sentences and does not appear short of breath  Cardiovascular: No lower extremity edema, non tender, no erythema  Skin: Warm dry intact with no signs of infection or rash on extremities or on axial skeleton.  Abdomen: Soft nontender  Neuro: Cranial nerves II through XII are intact, neurovascularly intact in all extremities with 2+ DTRs and 2+ pulses.  Lymph: No lymphadenopathy of posterior or anterior cervical chain or  axillae bilaterally.  Gait normal with good balance and coordination.  MSK:  Non tender with full range of motion and good stability and symmetric strength and tone of shoulders, elbows,  hip, knee and ankles bilaterally.  Left hand exam shows the patient does have a positive grind test of the [facility].  Mild thenar wasting noted.  Grip strength 5 out of 5.  Neurovascular intact distally.  Full range of the wrist. Contralateral thumb mild arthritis but no pain  Procedure: Real-time Ultrasound Guided Injection of left CMC joint Device: GE Logiq Q7 Ultrasound guided injection is preferred based studies that show increased duration,  increased effect, greater accuracy, decreased procedural pain, increased response rate, and decreased cost with ultrasound guided versus blind injection.  Verbal informed consent obtained.  Time-out conducted.  Noted no overlying erythema, induration, or other signs of local infection.  Skin prepped in a sterile fashion.  Local anesthesia: Topical Ethyl chloride.  With sterile technique and under real time ultrasound guidance: With a 25-gauge half inch needle was injected with a total of 0.5 cc of 0.5% Marcaine and 0.5 cc of Kenalog 40 mg/dL Completed without difficulty  Pain immediately resolved suggesting accurate placement of the medication.  Advised to call if fevers/chills, erythema, induration, drainage, or persistent bleeding.  Images permanently stored and available for review in the ultrasound unit.  Impression: Technically successful ultrasound guided injection.     Impression and Recommendations:     This case required medical decision making of moderate complexity.      Note: This dictation was prepared with Dragon dictation along with smaller phrase technology. Any transcriptional errors that result from this process are unintentional.

## 2017-09-25 ENCOUNTER — Ambulatory Visit: Payer: Self-pay

## 2017-09-25 ENCOUNTER — Encounter: Payer: Self-pay | Admitting: Family Medicine

## 2017-09-25 ENCOUNTER — Ambulatory Visit (INDEPENDENT_AMBULATORY_CARE_PROVIDER_SITE_OTHER): Payer: Medicare Other | Admitting: Family Medicine

## 2017-09-25 VITALS — BP 140/82 | HR 56 | Ht 65.0 in | Wt 163.0 lb

## 2017-09-25 DIAGNOSIS — G8929 Other chronic pain: Secondary | ICD-10-CM | POA: Diagnosis not present

## 2017-09-25 DIAGNOSIS — M79645 Pain in left finger(s): Secondary | ICD-10-CM

## 2017-09-25 DIAGNOSIS — M1812 Unilateral primary osteoarthritis of first carpometacarpal joint, left hand: Secondary | ICD-10-CM | POA: Diagnosis not present

## 2017-09-25 NOTE — Assessment & Plan Note (Signed)
Patient given injection.  Tolerated the procedure well.  We discussed icing regimen and home exercises.  We discussed which activities doing which wants to avoid.  Patient will do topical anti-inflammatories and potential gloves.  Follow-up again as needed

## 2017-09-25 NOTE — Patient Instructions (Signed)
Good to see you  Been 8 months so that is good.  Ice is your friend.  Can do every 3 months if needed See me again when you need me [phone]

## 2017-09-28 ENCOUNTER — Ambulatory Visit (INDEPENDENT_AMBULATORY_CARE_PROVIDER_SITE_OTHER): Payer: Medicare Other | Admitting: Pharmacist Clinician (PhC)/ Clinical Pharmacy Specialist

## 2017-09-28 DIAGNOSIS — E785 Hyperlipidemia, unspecified: Secondary | ICD-10-CM | POA: Diagnosis not present

## 2017-09-28 MED ORDER — ROSUVASTATIN CALCIUM 5 MG PO TABS: ORAL_TABLET | ORAL | 3 refills | Status: DC

## 2017-09-28 NOTE — Progress Notes (Signed)
09/29/2017 Julie [name] 1941-04-01 [id number]   HPI:  Julie [name] is a 77 y.o. female patient of Dr Oval Linsey, who presents today for a lipid clinic evaluation.  In addition to hyperlipidemia, her medical history is significant for stroke and CAD.   She did well with low dose atorvastatin, but once the dose was increased she developed myalgias and was forced to stop.  She also had concerns for memory impairment with the atorvastatin.    Current Medications:  Cholestyramine 4 gm prn - patient uses for bouts of diarrhea, not cholesterol management  Cholesterol Goals:   LDL < 70  Intolerant/previously tried:  Atorvastatin 10 and 20 mg - myalgias  Ezetimibe 10 mg - myalgias  Family history:   Mother 55 - atherosclerosis  Father 16 - demenita, old age   Diet:   Celiac disease - doesn't eat out much; no fried foods; plenty of fresh fruits/veggies  Exercise:    Deep water aerobics MWF; golfs each Wednesday when weather permits (rides in cart)  Labs:   07/2017 - TC  167, TG 139, HDL 54, LDL 85  - on lipitor 10 mg and ezetimibe  Current Outpatient Medications  Medication Sig Dispense Refill  . aspirin EC 81 MG tablet Take 81 mg by mouth daily.    . beclomethasone (QVAR) 80 MCG/ACT inhaler Inhale 1 puff into the lungs as needed.     . cholecalciferol (VITAMIN D) 1000 units tablet Take 1,000 Units by mouth daily.    . cholestyramine (QUESTRAN) 4 g packet TAKE 1 PACKET as needed. NO OTHER MEDICATION 2 HOURS BEFORE OR AFTER.  1  . cyanocobalamin (,VITAMIN B-12,) 1000 MCG/ML injection Inject 1 mL into the skin every 30 (thirty) days.   1  . levothyroxine (SYNTHROID, LEVOTHROID) 125 MCG tablet Take 1 tablet (125 mcg total) by mouth every other day. (Patient taking differently: Take 125 mcg by mouth daily. ) 30 tablet 0  . Multiple Vitamins-Minerals (MULTIVITAMIN WITH MINERALS) tablet Take 1 tablet by mouth daily.    . Probiotic Product (PRO-BIOTIC BLEND PO) Take 1 tablet by mouth daily.    .  rosuvastatin (CRESTOR) 5 MG tablet Take 1 to 3 tablets by mouth weekly as tolerated 36 tablet 3   No current facility-administered medications for this visit.     Allergies  Allergen Reactions  . Propantheline Rash    Cannot remember other symptoms   . Atorvastatin     Myalgia   . Sulfa Antibiotics Swelling  . Zetia [Ezetimibe]     Myalgias   . Demerol [Meperidine] Rash  . Morphine And Related Rash    Past Medical History:  Diagnosis Date  . Asthma   . CAD in native artery 06/06/2017   Mild LAD disease on coronary CT-A 05/2017.  . Cancer ([place])   . Celiac disease   . Hyperlipidemia 05/11/2017  . Hypertension   . Polycythemia   . Thyroid disease   . TIA (transient ischemic attack)    2017    There were no vitals taken for this visit.   Hyperlipidemia Patient with hyperlipidemia, unable to tolerate multiple doses of atorvastatin.  Patient also did not do well with ezetimibe.  We reviewed all options, including further statin trial, PCSK-9 inhibitors and medical research trials.  Patient is willing to re-challenge with a new statin, so will start her on rosuvastatin 5 mg once weekly.  After 4 weeks she will increase to twice weekly and then after several weeks, hopefully to three  times weekly.  She will get new labs drawn in the next day or two, so we have a baseline starting point.  We will follow up with her in about 3 months.     Tommy Medal PharmD CPP [facility]

## 2017-09-28 NOTE — Patient Instructions (Signed)
Get fasting blood labs drawn in the next week.  Start Crestor (rosuvastatin) 5 mg once weekly for 4 weeks.  At the end of this time please give me a call to let me know how you are feeling.  If you are doing well we will increase to twice weekly.  Again, call after 4 weeks and we will further decide what to do.  Kristin/Raquel at [phone].   Cholesterol Cholesterol is a fat. Your body needs a small amount of cholesterol. Cholesterol (plaque) may build up in your blood vessels (arteries). That makes you more likely to have a heart attack or stroke. You cannot feel your cholesterol level. Having a blood test is the only way to find out if your level is high. Keep your test results. Work with your doctor to keep your cholesterol at a good level. What do the results mean?  Total cholesterol is how much cholesterol is in your blood.  LDL is bad cholesterol. This is the type that can build up. Try to have low LDL.  HDL is good cholesterol. It cleans your blood vessels and carries LDL away. Try to have high HDL.  Triglycerides are fat that the body can store or burn for energy. What are good levels of cholesterol?  Total cholesterol below 200.  LDL below 100 is good for people who have health risks. LDL below 70 is good for people who have very high risks.  HDL above 40 is good. It is best to have HDL of 60 or higher.  Triglycerides below 150. How can I lower my cholesterol? Diet Follow your diet program as told by your doctor.  Choose fish, white meat chicken, or [place] that is roasted or baked. Try not to eat red meat, fried foods, sausage, or lunch meats.  Eat lots of fresh fruits and vegetables.  Choose whole grains, beans, pasta, potatoes, and cereals.  Choose olive oil, corn oil, or canola oil. Only use small amounts.  Try not to eat butter, mayonnaise, shortening, or palm kernel oils.  Try not to eat foods with trans fats.  Choose low-fat or nonfat dairy  foods. ? Drink skim or nonfat milk. ? Eat low-fat or nonfat yogurt and cheeses. ? Try not to drink whole milk or cream. ? Try not to eat ice cream, egg yolks, or full-fat cheeses.  Healthy desserts include angel food cake, ginger snaps, animal crackers, hard candy, popsicles, and low-fat or nonfat frozen yogurt. Try not to eat pastries, cakes, pies, and cookies.  Exercise Follow your exercise program as told by your doctor.  Be more active. Try gardening, walking, and taking the stairs.  Ask your doctor about ways that you can be more active.  Medicine  Take over-the-counter and prescription medicines only as told by your doctor. This information is not intended to replace advice given to you by your health care provider. Make sure you discuss any questions you have with your health care provider. Document Released: 11/18/2008 Document Revised: 03/23/2016 Document Reviewed: 03/03/2016 Elsevier Interactive Patient Education  [employer].

## 2017-09-29 NOTE — Assessment & Plan Note (Signed)
Patient with hyperlipidemia, unable to tolerate multiple doses of atorvastatin.  Patient also did not do well with ezetimibe.  We reviewed all options, including further statin trial, PCSK-9 inhibitors and medical research trials.  Patient is willing to re-challenge with a new statin, so will start her on rosuvastatin 5 mg once weekly.  After 4 weeks she will increase to twice weekly and then after several weeks, hopefully to three times weekly.  She will get new labs drawn in the next day or two, so we have a baseline starting point.  We will follow up with her in about 3 months.

## 2017-10-03 LAB — LIPID PANEL
CHOL/HDL RATIO: 4.3 ratio (ref 0.0–4.4)
Cholesterol, Total: 248 mg/dL — ABNORMAL HIGH (ref 100–199)
HDL: 58 mg/dL (ref >39)
LDL Calculated: 169 mg/dL — ABNORMAL HIGH (ref 0–99)
TRIGLYCERIDES: 105 mg/dL (ref 0–149)
VLDL Cholesterol Cal: 21 mg/dL (ref 5–40)

## 2017-10-03 LAB — HEPATIC FUNCTION PANEL
ALK PHOS: 86 IU/L (ref 39–117)
ALT: 20 IU/L (ref 0–32)
AST: 18 IU/L (ref 0–40)
Albumin: 4 g/dL (ref 3.5–4.8)
BILIRUBIN, DIRECT: 0.17 mg/dL (ref 0.00–0.40)
Bilirubin Total: 0.7 mg/dL (ref 0.0–1.2)
Total Protein: 6.1 g/dL (ref 6.0–8.5)

## 2017-10-09 ENCOUNTER — Encounter: Payer: Self-pay | Admitting: Pharmacist Clinician (PhC)/ Clinical Pharmacy Specialist

## 2017-11-08 ENCOUNTER — Other Ambulatory Visit: Payer: Self-pay | Admitting: Internal Medicine

## 2017-11-08 DIAGNOSIS — Z139 Encounter for screening, unspecified: Secondary | ICD-10-CM

## 2017-11-28 ENCOUNTER — Telehealth: Payer: Self-pay | Admitting: Pharmacist Clinician (PhC)/ Clinical Pharmacy Specialist

## 2017-11-28 NOTE — Telephone Encounter (Signed)
Patient on rosuvastatin 5 mg - started with once weekly for 4 weeks, now has been on twice weekly for 3 weeks.  Feels good but admits to some soreness in her hips.  Not willing to decrease back to once weekly, as she recently started golfing again and cannot be sure that it isn't the increase in exercise.  Will repeat labs in 3-4 weeks to see where LDL is, then will need to consider PCSK-9

## 2017-12-01 ENCOUNTER — Ambulatory Visit: Payer: Medicare Other

## 2018-02-05 ENCOUNTER — Other Ambulatory Visit: Payer: Self-pay | Admitting: Pharmacist Clinician (PhC)/ Clinical Pharmacy Specialist

## 2018-02-05 DIAGNOSIS — E785 Hyperlipidemia, unspecified: Secondary | ICD-10-CM

## 2018-02-07 LAB — HEPATIC FUNCTION PANEL
ALBUMIN: 3.9 g/dL (ref 3.5–4.8)
ALK PHOS: 70 IU/L (ref 39–117)
ALT: 40 IU/L — AB (ref 0–32)
AST: 31 IU/L (ref 0–40)
Bilirubin Total: 0.6 mg/dL (ref 0.0–1.2)
Bilirubin, Direct: 0.19 mg/dL (ref 0.00–0.40)
Total Protein: 6.1 g/dL (ref 6.0–8.5)

## 2018-02-07 LAB — LIPID PANEL
Chol/HDL Ratio: 4.2 ratio (ref 0.0–4.4)
Cholesterol, Total: 220 mg/dL — ABNORMAL HIGH (ref 100–199)
HDL: 52 mg/dL (ref >39)
LDL Calculated: 142 mg/dL — ABNORMAL HIGH (ref 0–99)
Triglycerides: 132 mg/dL (ref 0–149)
VLDL CHOLESTEROL CAL: 26 mg/dL (ref 5–40)

## 2018-02-13 ENCOUNTER — Telehealth: Payer: Self-pay | Admitting: Pharmacist

## 2018-02-13 DIAGNOSIS — E785 Hyperlipidemia, unspecified: Secondary | ICD-10-CM

## 2018-02-13 MED ORDER — ROSUVASTATIN CALCIUM 5 MG PO TABS: ORAL_TABLET | ORAL | 0 refills | Status: DC

## 2018-02-13 NOTE — Telephone Encounter (Signed)
Lipid panel results and MD recommendations discussed with patient.    She remains reluctant to initiating PCSK9i. "Don't want to start using Repatha"   Patient willing to increase rosuvastatin to 3x weekly (curretly taking rosuvastatin 2x/weekly)

## 2018-02-27 ENCOUNTER — Ambulatory Visit
Admission: RE | Admit: 2018-02-27 | Discharge: 2018-02-27 | Disposition: A | Discharge status: Home / Self Care | Payer: Medicare Other | Source: Ambulatory Visit | Attending: Internal Medicine | Admitting: Internal Medicine

## 2018-02-27 DIAGNOSIS — Z139 Encounter for screening, unspecified: Secondary | ICD-10-CM

## 2018-05-10 ENCOUNTER — Telehealth: Payer: Self-pay

## 2018-05-10 NOTE — Telephone Encounter (Signed)
Copied from Olga [phone]. Topic: Appointment Scheduling - Scheduling Inquiry for Clinic >> May 10, 2018 11:11 AM Lennox Solders wrote: Reason for CRM:pt has an appt with dr Tamala Julian on 05-29-18. Pt would like sooner appt

## 2018-05-10 NOTE — Telephone Encounter (Signed)
Left message for patient that we can call her back if we have a cancellation as we have no openings until her appointment on the 24th.

## 2018-05-29 ENCOUNTER — Encounter: Payer: Self-pay | Admitting: Family Medicine

## 2018-05-29 ENCOUNTER — Ambulatory Visit: Payer: Self-pay

## 2018-05-29 ENCOUNTER — Ambulatory Visit (INDEPENDENT_AMBULATORY_CARE_PROVIDER_SITE_OTHER): Payer: Medicare Other | Admitting: Family Medicine

## 2018-05-29 VITALS — BP 112/80 | HR 50 | Ht 65.0 in | Wt 163.0 lb

## 2018-05-29 DIAGNOSIS — M79645 Pain in left finger(s): Secondary | ICD-10-CM | POA: Diagnosis not present

## 2018-05-29 DIAGNOSIS — M1812 Unilateral primary osteoarthritis of first carpometacarpal joint, left hand: Secondary | ICD-10-CM | POA: Diagnosis not present

## 2018-05-29 DIAGNOSIS — M25561 Pain in right knee: Secondary | ICD-10-CM

## 2018-05-29 DIAGNOSIS — G8929 Other chronic pain: Secondary | ICD-10-CM

## 2018-05-29 NOTE — Assessment & Plan Note (Signed)
Stable with patient's wrapping.  Does not having worsening pain.  We will continue to monitor.

## 2018-05-29 NOTE — Assessment & Plan Note (Signed)
Worsening pain.  Repeat injection given today.  Discussed icing regimen and home exercises.  Discussed bracing at night.  Follow-up again in 4 weeks

## 2018-05-29 NOTE — Progress Notes (Signed)
[name] [address] [place], [place] [address] Phone: [phone] Subjective:   Julie [name], am serving as a scribe for Dr. Hulan Saas.  I'm seeing this patient by the request  of:    CC: Left foot pain  [id number]  Julie [name] is a 77 y.o. female coming in with complaint of right knee and left thumb. She uses kinesio tape for the right knee to alleviate patella pain. Uses it when she is golfing. Is having some relief.   Has been having increasing pain in her hand, left CMC joint. Leonard injury 2 weeks ago.  Patient has had good response to injections previously.  Last one was a month ago.  Worsening pain again around the [facility] joint.  Is going golfing on a long trip and would like to be pain-free.       Past Medical History:  Diagnosis Date  . Asthma   . CAD in native artery 06/06/2017   Mild LAD disease on coronary CT-A 05/2017.  . Cancer ([place])   . Celiac disease   . Hyperlipidemia 05/11/2017  . Hypertension   . Polycythemia   . Thyroid disease   . TIA (transient ischemic attack)    2017   Past Surgical History:  Procedure Laterality Date  . ABDOMINAL HYSTERECTOMY    . BLADDER SURGERY     Social History   Socioeconomic History  . Marital status: Single    Spouse name: Not on file  . Number of children: Not on file  . Years of education: Not on file  . Highest education level: Not on file  Occupational History  . Not on file  Social Needs  . Financial resource strain: Not on file  . Food insecurity:    Worry: Not on file    Inability: Not on file  . Transportation needs:    Medical: Not on file    Non-medical: Not on file  Tobacco Use  . Smoking status: Never Smoker  . Smokeless tobacco: Never Used  Substance and Sexual Activity  . Alcohol use: Yes    Comment: daily  . Drug use: [name]  . Sexual activity: Not on file  Lifestyle  . Physical activity:    Days per week: Not on file    Minutes per session: Not on  file  . Stress: Not on file  Relationships  . Social connections:    Talks on phone: Not on file    Gets together: Not on file    Attends religious service: Not on file    Active member of club or organization: Not on file    Attends meetings of clubs or organizations: Not on file    Relationship status: Not on file  Other Topics Concern  . Not on file  Social History Narrative  . Not on file   Allergies  Allergen Reactions  . Propantheline Rash    Cannot remember other symptoms   . Atorvastatin     Myalgia   . Sulfa Antibiotics Swelling  . Zetia [Ezetimibe]     Myalgias   . Demerol [Meperidine] Rash  . Morphine And Related Rash   Family History  Problem Relation Age of Onset  . Peripheral Artery Disease Mother   . Dementia Father     Current Outpatient Medications (Endocrine & Metabolic):  .  levothyroxine (SYNTHROID, LEVOTHROID) 125 MCG tablet, Take 1 tablet (125 mcg total) by mouth every other day. (Patient taking differently: Take  125 mcg by mouth daily. )  Current Outpatient Medications (Cardiovascular):  .  cholestyramine (QUESTRAN) 4 g packet, TAKE 1 PACKET as needed. [name] OTHER MEDICATION 2 HOURS BEFORE OR AFTER. .  rosuvastatin (CRESTOR) 5 MG tablet, Take 1 tablet by mouth 3 times weekly  Current Outpatient Medications (Respiratory):  .  beclomethasone (QVAR) 80 MCG/ACT inhaler, Inhale 1 puff into the lungs as needed.   Current Outpatient Medications (Analgesics):  .  aspirin EC 81 MG tablet, Take 81 mg by mouth daily.   Current Outpatient Medications (Other):  .  cholecalciferol (VITAMIN D) 1000 units tablet, Take 1,000 Units by mouth daily. .  Multiple Vitamins-Minerals (MULTIVITAMIN WITH MINERALS) tablet, Take 1 tablet by mouth daily. .  Probiotic Product (PRO-BIOTIC BLEND PO), Take 1 tablet by mouth daily.    Past medical history, social, surgical and family history all reviewed in electronic medical record.  [name] pertanent information unless stated  regarding to the chief complaint.   Review of Systems:  [name] headache, visual changes, nausea, vomiting, diarrhea, constipation, dizziness, abdominal pain, skin rash, fevers, chills, night sweats, weight loss, swollen lymph nodes, body aches, joint swelling, muscle aches, chest pain, shortness of breath, mood changes.  Positive muscle aches  Objective  Blood pressure 112/80, pulse (!) 50, height 5' 5"  (1.651 m), weight 163 lb (73.9 kg), SpO2 98 %.    General: [name] apparent distress alert and oriented x3 mood and affect normal, dressed appropriately.  HEENT: Pupils equal, extraocular movements intact  Respiratory: Patient's speak in full sentences and does not appear short of breath  Cardiovascular: [name] lower extremity edema, non tender, [name] erythema  Skin: Warm dry intact with [name] signs of infection or rash on extremities or on axial skeleton.  Abdomen: Soft nontender  Neuro: Cranial nerves II through XII are intact, neurovascularly intact in all extremities with 2+ DTRs and 2+ pulses.  Lymph: [name] lymphadenopathy of posterior or anterior cervical chain or axillae bilaterally.  Gait normal with good balance and coordination.  MSK:  Non tender with full range of motion and good stability and symmetric strength and tone of shoulders, elbows,  hip, knee and ankles bilaterally.  Changes of multiple joints Exam shows the patient does have arthritic changes.  Mild atrophy of the thenar eminence.  Patient does have a positive grind test.   Procedure: Real-time Ultrasound Guided Injection of left CMC joint Device: GE Logiq Q7 Ultrasound guided injection is preferred based studies that show increased duration, increased effect, greater accuracy, decreased procedural pain, increased response rate, and decreased cost with ultrasound guided versus blind injection.  Verbal informed consent obtained.  Time-out conducted.  Noted [name] overlying erythema, induration, or other signs of local infection.  Skin prepped  in a sterile fashion.  Local anesthesia: Topical Ethyl chloride.  With sterile technique and under real time ultrasound guidance: 25-gauge half inch needle injected with 0.5 cc of 0.5% Marcaine and 0.5 cc of Kenalog 40 mg/mL Completed without difficulty  Pain immediately resolved suggesting accurate placement of the medication.  Advised to call if fevers/chills, erythema, induration, drainage, or persistent bleeding.  Images permanently stored and available for review in the ultrasound unit.  Impression: Technically successful ultrasound guided injection.    Impression and Recommendations:     This case required medical decision making of moderate complexity. The above documentation has been reviewed and is accurate and complete Lyndal Pulley, DO       Note: This dictation was prepared with Dragon dictation along with smaller  Company secretary. Any transcriptional errors that result from this process are unintentional.

## 2018-05-29 NOTE — Patient Instructions (Signed)
Good to see you  We will pretend we do not know about your wrist Lets give it time Ice is your friend Injected the thumb

## 2018-07-30 IMAGING — CT CT HEART MORP W/ CTA COR W/ SCORE W/ CA W/CM &/OR W/O CM
4 of 7 series · 8 of 20 positions shown, 9 images · IV contrast (APPLIED)
Comparison: None.

CLINICAL DATA: Chest pain

EXAM:
Cardiac CTA
MEDICATIONS:
Sub lingual nitro. 4mg
TECHNIQUE: The patient was scanned on a Siemens [REDACTED]ice scanner. Gantry
rotation speed was 240 msecs. Collimation was 0.6 mm. A 100 kV
prospective scan was triggered in the ascending thoracic aorta at
65-75% of the R-R interval. Average HR during the scan was 60 bpm.
The 3D data set was interpreted on a dedicated work station using
MPR, MIP and VRT modes. A total of 80cc of contrast was used.

[Series 6: best diast 69 % · axial · 0.40mm/px · z∈[+811,+863]mm · 2 of 388 slices shown, 3 images]
[im 130/388  vessel]
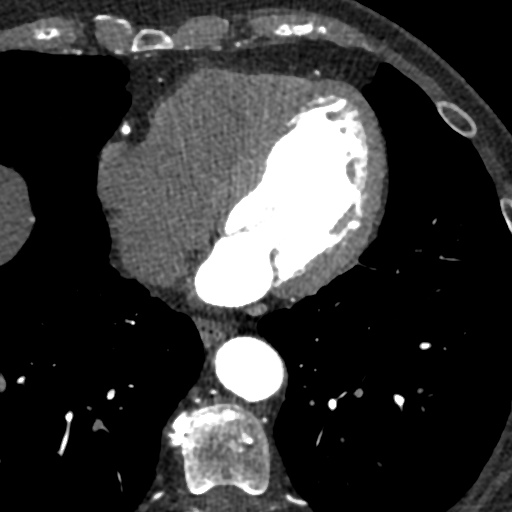
[im 130/388  lung]
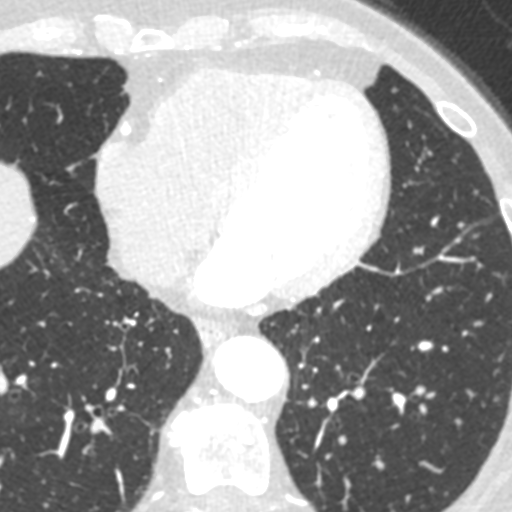
[im 259/388  vessel]
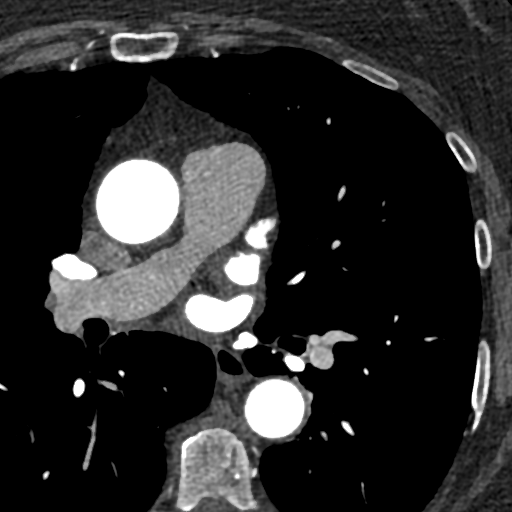

[Series 7: best syst 40 % · axial · 0.40mm/px · z∈[+811,+863]mm · 2 of 388 slices shown]
[im 130/388  vessel]
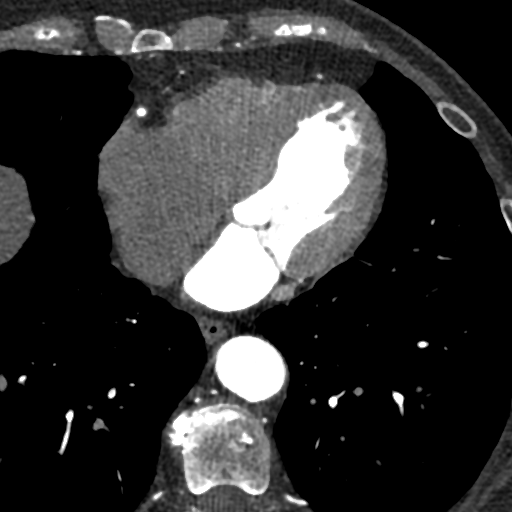
[im 259/388  vessel]
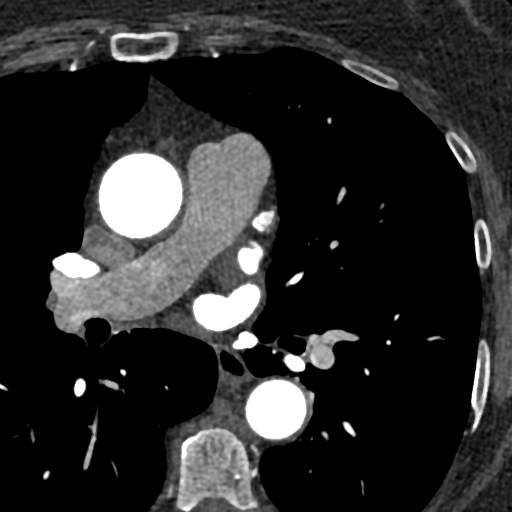

[Series 8: ts diast sharp 69 % · axial · 0.40mm/px · z∈[+811,+863]mm · 2 of 388 slices shown]
[im 130/388  lung]
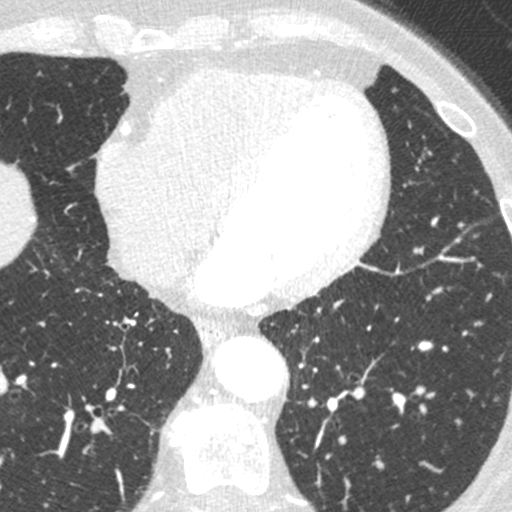
[im 259/388  lung]
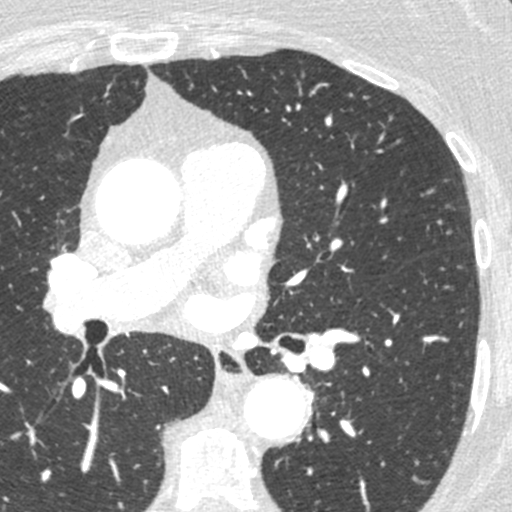

[Series 9: ts syst sharp 40 % · axial · 0.40mm/px · z∈[+811,+863]mm · 2 of 388 slices shown]
[im 130/388  lung]
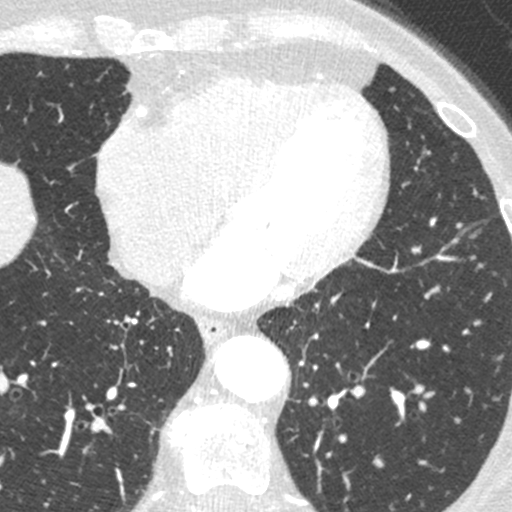
[im 259/388  lung]
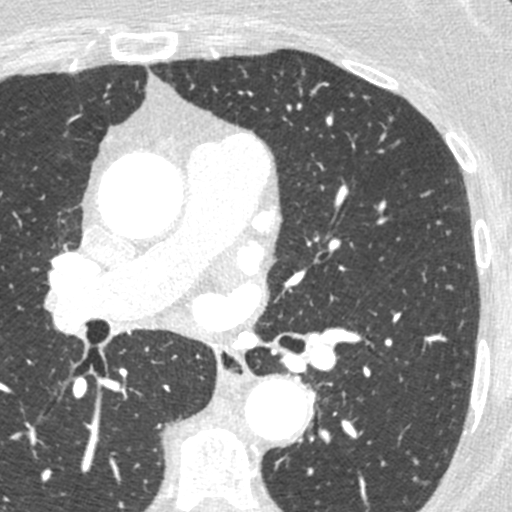

[8 of 20 positions shown; findings below may reference images not displayed]

FINDINGS: Non-cardiac: See separate report from [REDACTED].

Coronary Arteries: Right dominant with no anomalies

LM:  No plaque or stenosis.

LAD system: Mixed plaque without significant stenosis ostial LAD.
There was an area of soft plaque in the proximal LAD with mild
stenosis. Small to moderate D1 with mixed plaque, no significant
stenosis proximally. Moderate D2 without significant disease.

Circumflex system: Small ramus, no significant disease. No plaque or
stenosis in the LCx system.

RCA system:  No plaque or stenosis.
IMPRESSION: 1.  Nonobstructive coronary disease.

2.  No calcium score sequence was done.

Quirijn Amazigh

EXAM:
OVER-READ INTERPRETATION  CT CHEST

The following report is an over-read performed by radiologist Dr.
over-read does not include interpretation of cardiac or coronary
anatomy or pathology. The coronary calcium score/coronary CTA
interpretation by the cardiologist is attached.
FINDINGS: Aortic atherosclerosis. Calcified granuloma in the inferior aspect
of the left lower lobe. Within the visualized portions of the thorax
there are no other suspicious appearing pulmonary nodules or masses,
no acute consolidative airspace disease, no pleural effusions, no
pneumothorax and no lymphadenopathy. Visualized portions of the
upper abdomen are unremarkable. There are no aggressive appearing
lytic or blastic lesions noted in the visualized portions of the
skeleton.
IMPRESSION: 1.  Aortic Atherosclerosis ([id number]-SCG.G).

## 2018-10-22 NOTE — Progress Notes (Signed)
[name] [address], [place] [address] Phone: [phone] Subjective:   Julie [name], am serving as a scribe for Dr. Hulan Saas.  CC:  Right knee pain   [id number]  Julie [name] is a 78 y.o. female coming in with complaint of right knee pain. Last seen in September 2019 for patellofemoral arthralgia. Patient states that she may have twisted her knee on her camping trip. Pain isn't as bad as it once was. Was experiencing clicking but that has gone away. Is using a brace daily. Feels that the leg is weaker.     Past Medical History:  Diagnosis Date  . Asthma   . CAD in native artery 06/06/2017   Mild LAD disease on coronary CT-A 05/2017.  . Cancer ([place])   . Celiac disease   . Hyperlipidemia 05/11/2017  . Hypertension   . Polycythemia   . Thyroid disease   . TIA (transient ischemic attack)    2017   Past Surgical History:  Procedure Laterality Date  . ABDOMINAL HYSTERECTOMY    . BLADDER SURGERY     Social History   Socioeconomic History  . Marital status: Single    Spouse name: Not on file  . Number of children: Not on file  . Years of education: Not on file  . Highest education level: Not on file  Occupational History  . Not on file  Social Needs  . Financial resource strain: Not on file  . Food insecurity:    Worry: Not on file    Inability: Not on file  . Transportation needs:    Medical: Not on file    Non-medical: Not on file  Tobacco Use  . Smoking status: Never Smoker  . Smokeless tobacco: Never Used  Substance and Sexual Activity  . Alcohol use: Yes    Comment: daily  . Drug use: [name]  . Sexual activity: Not on file  Lifestyle  . Physical activity:    Days per week: Not on file    Minutes per session: Not on file  . Stress: Not on file  Relationships  . Social connections:    Talks on phone: Not on file    Gets together: Not on file    Attends religious service: Not on file    Active member of club  or organization: Not on file    Attends meetings of clubs or organizations: Not on file    Relationship status: Not on file  Other Topics Concern  . Not on file  Social History Narrative  . Not on file   Allergies  Allergen Reactions  . Propantheline Rash    Cannot remember other symptoms   . Atorvastatin     Myalgia   . Sulfa Antibiotics Swelling  . Zetia [Ezetimibe]     Myalgias   . Demerol [Meperidine] Rash  . Morphine And Related Rash   Family History  Problem Relation Age of Onset  . Peripheral Artery Disease Mother   . Dementia Father     Current Outpatient Medications (Endocrine & Metabolic):  .  levothyroxine (SYNTHROID, LEVOTHROID) 125 MCG tablet, Take 1 tablet (125 mcg total) by mouth every other day. (Patient taking differently: Take 125 mcg by mouth daily. )  Current Outpatient Medications (Cardiovascular):  .  cholestyramine (QUESTRAN) 4 g packet, TAKE 1 PACKET as needed. [name] OTHER MEDICATION 2 HOURS BEFORE OR AFTER. .  rosuvastatin (CRESTOR) 5 MG tablet, Take 1 tablet by mouth  3 times weekly  Current Outpatient Medications (Respiratory):  .  beclomethasone (QVAR) 80 MCG/ACT inhaler, Inhale 1 puff into the lungs as needed.   Current Outpatient Medications (Analgesics):  .  aspirin EC 81 MG tablet, Take 81 mg by mouth daily.   Current Outpatient Medications (Other):  .  cholecalciferol (VITAMIN D) 1000 units tablet, Take 1,000 Units by mouth daily. .  Multiple Vitamins-Minerals (MULTIVITAMIN WITH MINERALS) tablet, Take 1 tablet by mouth daily.    Past medical history, social, surgical and family history all reviewed in electronic medical record.  [name] pertanent information unless stated regarding to the chief complaint.   Review of Systems:  [name] headache, visual changes, nausea, vomiting, diarrhea, constipation, dizziness, abdominal pain, skin rash, fevers, chills, night sweats, weight loss, swollen lymph nodes, body aches, joint swelling, , chest pain,  shortness of breath, mood changes.  Positive muscle aches  Objective  Blood pressure 112/72, pulse (!) 47, height 5' 5"  (1.651 m), weight 163 lb (73.9 kg), SpO2 96 %.f    General: [name] apparent distress alert and oriented x3 mood and affect normal, dressed appropriately.  HEENT: Pupils equal, extraocular movements intact  Respiratory: Patient's speak in full sentences and does not appear short of breath  Cardiovascular: [name] lower extremity edema, non tender, [name] erythema  Skin: Warm dry intact with [name] signs of infection or rash on extremities or on axial skeleton.  Abdomen: Soft nontender  Neuro: Cranial nerves II through XII are intact, neurovascularly intact in all extremities with 2+ DTRs and 2+ pulses.  Lymph: [name] lymphadenopathy of posterior or anterior cervical chain or axillae bilaterally.  Gait mild antalgic favoring the right knee MSK:  tender with full range of motion and good stability and symmetric strength and tone of shoulders, elbows, , hip and ankles bilaterally.  Arthritic changes of multiple joints especially CMC joint Knee: Right Normal to inspection with [name] erythema or effusion or obvious bony abnormalities. Palpation normal with [name] warmth, joint line tenderness, patellar tenderness, or condyle tenderness. ROM full in flexion and extension and lower leg rotation. Ligaments with solid consistent endpoints including ACL, PCL, LCL, MCL. Negative Mcmurray's, Apley's, and Thessalonian tests. painful patellar compression. Patellar glide severe crepitus. Patellar and quadriceps tendons unremarkable. Hamstring and quadriceps strength is normal.    Impression and Recommendations:     This case required medical decision making of moderate complexity. The above documentation has been reviewed and is accurate and complete Julie Pulley, DO       Note: This dictation was prepared with Dragon dictation along with smaller phrase technology. Any transcriptional errors that result  from this process are unintentional.

## 2018-10-23 ENCOUNTER — Ambulatory Visit (INDEPENDENT_AMBULATORY_CARE_PROVIDER_SITE_OTHER): Payer: Medicare Other | Admitting: Family Medicine

## 2018-10-23 ENCOUNTER — Encounter: Payer: Self-pay | Admitting: Family Medicine

## 2018-10-23 DIAGNOSIS — M25561 Pain in right knee: Secondary | ICD-10-CM

## 2018-10-23 NOTE — Assessment & Plan Note (Addendum)
Patellofemoral arthritis.  Discussed bracing, home exercises, we discussed the possibility of injections with patient declined.  Patient does have some instability of the kneecap as well as the knee itself.  Patient wants to avoid any surgical intervention but does not want a custom brace secondary to the decrease in range of motion.  Topical anti-inflammatories given.  Patient will follow-up again in 4 weeks for further evaluation and treatment.

## 2018-10-23 NOTE — Patient Instructions (Signed)
Good to see you  Ice is your friend  Exercises 3 times a week.  Wear the brace with working out and with golf  pennsaid pinkie amount topically 2 times daily as needed.  See me again in 3 weeks and if in pain we will do injection  Great to see you both!

## 2018-11-13 ENCOUNTER — Ambulatory Visit (INDEPENDENT_AMBULATORY_CARE_PROVIDER_SITE_OTHER): Payer: Medicare Other | Admitting: Family Medicine

## 2018-11-13 ENCOUNTER — Encounter: Payer: Self-pay | Admitting: Family Medicine

## 2018-11-13 DIAGNOSIS — M25561 Pain in right knee: Secondary | ICD-10-CM | POA: Diagnosis not present

## 2018-11-13 NOTE — Patient Instructions (Signed)
Good to see you  pennsaid pinkie amount topically 2 times daily as needed.  Call if you run out Stay active See me again in 4-6 weeks and we will inject before your camping trip

## 2018-11-13 NOTE — Assessment & Plan Note (Signed)
Stable, mild swelling still noted.  We will hold on any type of injection at this time but patient wants to consider it before she goes hiking.  Does have bracing.  Discussed topical anti-inflammatories and icing regimen.  Follow-up again in 4 to 6 weeks

## 2018-11-13 NOTE — Progress Notes (Signed)
[name] [address] [place], [place] [address] Phone: [phone] Subjective:     CC: Right knee pain  HDQ:[id number]   10/23/2018: Patellofemoral arthritis.  Discussed bracing, home exercises, we discussed the possibility of injections with patient declined.  Patient does have some instability of the kneecap as well as the knee itself.  Patient wants to avoid any surgical intervention but does not want a custom brace secondary to the decrease in range of motion.  Topical anti-inflammatories given.  Patient will follow-up again in 4 weeks for further evaluation and treatment.  Update 11/13/2018: Julie [name] is a 78 y.o. female coming in with complaint of right knee pain. Has felt improvement since last visit. Wants to make sure there is no more blood pooling in the joint.  Patient states that overall feeling better.  States that the weakness she was having has improved.  Is able to swim on a more regular basis, even has some done around a golf with very minimal discomfort.    Past Medical History:  Diagnosis Date  . Asthma   . CAD in native artery 06/06/2017   Mild LAD disease on coronary CT-A 05/2017.  . Cancer ([place])   . Celiac disease   . Hyperlipidemia 05/11/2017  . Hypertension   . Polycythemia   . Thyroid disease   . TIA (transient ischemic attack)    2017   Past Surgical History:  Procedure Laterality Date  . ABDOMINAL HYSTERECTOMY    . BLADDER SURGERY     Social History   Socioeconomic History  . Marital status: Single    Spouse name: Not on file  . Number of children: Not on file  . Years of education: Not on file  . Highest education level: Not on file  Occupational History  . Not on file  Social Needs  . Financial resource strain: Not on file  . Food insecurity:    Worry: Not on file    Inability: Not on file  . Transportation needs:    Medical: Not on file    Non-medical: Not on file  Tobacco Use  . Smoking status: Never  Smoker  . Smokeless tobacco: Never Used  Substance and Sexual Activity  . Alcohol use: Yes    Comment: daily  . Drug use: No  . Sexual activity: Not on file  Lifestyle  . Physical activity:    Days per week: Not on file    Minutes per session: Not on file  . Stress: Not on file  Relationships  . Social connections:    Talks on phone: Not on file    Gets together: Not on file    Attends religious service: Not on file    Active member of club or organization: Not on file    Attends meetings of clubs or organizations: Not on file    Relationship status: Not on file  Other Topics Concern  . Not on file  Social History Narrative  . Not on file   Allergies  Allergen Reactions  . Propantheline Rash    Cannot remember other symptoms   . Atorvastatin     Myalgia   . Sulfa Antibiotics Swelling  . Zetia [Ezetimibe]     Myalgias   . Demerol [Meperidine] Rash  . Morphine And Related Rash   Family History  Problem Relation Age of Onset  . Peripheral Artery Disease Mother   . Dementia Father     Current Outpatient Medications (  Endocrine & Metabolic):  .  levothyroxine (SYNTHROID, LEVOTHROID) 125 MCG tablet, Take 1 tablet (125 mcg total) by mouth every other day. (Patient taking differently: Take 125 mcg by mouth daily. )  Current Outpatient Medications (Cardiovascular):  .  cholestyramine (QUESTRAN) 4 g packet, TAKE 1 PACKET as needed. NO OTHER MEDICATION 2 HOURS BEFORE OR AFTER. .  rosuvastatin (CRESTOR) 5 MG tablet, Take 1 tablet by mouth 3 times weekly  Current Outpatient Medications (Respiratory):  .  beclomethasone (QVAR) 80 MCG/ACT inhaler, Inhale 1 puff into the lungs as needed.   Current Outpatient Medications (Analgesics):  .  aspirin EC 81 MG tablet, Take 81 mg by mouth daily.   Current Outpatient Medications (Other):  .  cholecalciferol (VITAMIN D) 1000 units tablet, Take 1,000 Units by mouth daily. .  Multiple Vitamins-Minerals (MULTIVITAMIN WITH MINERALS)  tablet, Take 1 tablet by mouth daily.    Past medical history, social, surgical and family history all reviewed in electronic medical record.  No pertanent information unless stated regarding to the chief complaint.   Review of Systems:  No headache, visual changes, nausea, vomiting, diarrhea, constipation, dizziness, abdominal pain, skin rash, fevers, chills, night sweats, weight loss, swollen lymph nodes, body aches,chest pain, shortness of breath, mood changes.  Mild positive muscle aches and joint swelling  Objective  Blood pressure 120/82, pulse 72, height 5' 5"  (1.651 m), weight 164 lb (74.4 kg), SpO2 97 %.    General: No apparent distress alert and oriented x3 mood and affect normal, dressed appropriately.  HEENT: Pupils equal, extraocular movements intact  Respiratory: Patient's speak in full sentences and does not appear short of breath  Cardiovascular: No lower extremity edema, non tender, no erythema  Skin: Warm dry intact with no signs of infection or rash on extremities or on axial skeleton.  Abdomen: Soft nontender  Neuro: Cranial nerves II through XII are intact, neurovascularly intact in all extremities with 2+ DTRs and 2+ pulses.  Lymph: No lymphadenopathy of posterior or anterior cervical chain or axillae bilaterally.  Gait mild antalgic MSK:  Non tender with full range of motion and good stability and symmetric strength and tone of shoulders, elbows, wrist, hip and ankles bilaterally.  Arthritic changes of the thumbs bilaterally Right knee exam shows the patient does have some lateral tracking of the patella.  Some grinding noted.  Trace effusion of the patellofemoral joint noted.  Mild instability noted with patient's underlying arthritic changes but near full range of motion.  Less tenderness than previous exam.      Impression and Recommendations:      The above documentation has been reviewed and is accurate and complete Lyndal Pulley, DO       Note: This  dictation was prepared with Dragon dictation along with smaller phrase technology. Any transcriptional errors that result from this process are unintentional.

## 2018-12-17 ENCOUNTER — Ambulatory Visit: Payer: Medicare Other | Admitting: Family Medicine

## 2018-12-25 ENCOUNTER — Telehealth: Payer: Self-pay | Admitting: Cardiovascular Disease

## 2018-12-25 NOTE — Telephone Encounter (Signed)
LMOM to schedule a virtual Visit with Dr. Oval Linsey

## 2019-01-10 ENCOUNTER — Other Ambulatory Visit: Payer: Self-pay

## 2019-01-10 ENCOUNTER — Ambulatory Visit (INDEPENDENT_AMBULATORY_CARE_PROVIDER_SITE_OTHER)
Admission: RE | Admit: 2019-01-10 | Discharge: 2019-01-10 | Disposition: A | Discharge status: Home / Self Care | Payer: Medicare Other | Source: Ambulatory Visit | Attending: Family Medicine | Admitting: Family Medicine

## 2019-01-10 ENCOUNTER — Ambulatory Visit (INDEPENDENT_AMBULATORY_CARE_PROVIDER_SITE_OTHER): Payer: Medicare Other | Admitting: Family Medicine

## 2019-01-10 ENCOUNTER — Encounter: Payer: Self-pay | Admitting: Family Medicine

## 2019-01-10 ENCOUNTER — Ambulatory Visit: Payer: Self-pay

## 2019-01-10 VITALS — BP 124/82 | HR 50 | Ht 65.0 in | Wt 165.0 lb

## 2019-01-10 DIAGNOSIS — G8929 Other chronic pain: Secondary | ICD-10-CM

## 2019-01-10 DIAGNOSIS — M79645 Pain in left finger(s): Secondary | ICD-10-CM

## 2019-01-10 DIAGNOSIS — M1812 Unilateral primary osteoarthritis of first carpometacarpal joint, left hand: Secondary | ICD-10-CM | POA: Diagnosis not present

## 2019-01-10 DIAGNOSIS — M25561 Pain in right knee: Secondary | ICD-10-CM | POA: Diagnosis not present

## 2019-01-10 DIAGNOSIS — S83281D Other tear of lateral meniscus, current injury, right knee, subsequent encounter: Secondary | ICD-10-CM | POA: Diagnosis not present

## 2019-01-10 NOTE — Patient Instructions (Signed)
Good to see you  Knee xray downstairs Continue to wear the brace with golf  Ice is your friend Injected the thumb  See me again in 4 weeks if knee is perfect

## 2019-01-10 NOTE — Assessment & Plan Note (Signed)
Lateral meniscal tear.  Does have some instability.  I do think that there is also concern for patient having a LCL tear.  Encouraged bracing.  Patient does not want further work-up at this time such as an MRI.

## 2019-01-10 NOTE — Progress Notes (Signed)
[name] [address] [place], [place] [address] Phone: [phone] Subjective:   I Julie [name] am serving as a Education administrator for Dr. Hulan Saas.   CC: Left thumb pain, right knee pain  [id number]  Julie [name] is a 78 y.o. female coming in with complaint left thumb pain.  Known arthritic changes of the thumb.  Has responded to injections previously.  Last one was.  In September.  Limited ROM. Would like injection. Right knee check up. Knee is doing better.   Onset- Chronic  Patient was found to have large effusion and did have a lateral meniscal tear.  Since still does not feel completely stable but does not have any significant pain.  Able to do everything and this even played golf fairly regularly recently.      Past Medical History:  Diagnosis Date  . Asthma   . CAD in native artery 06/06/2017   Mild LAD disease on coronary CT-A 05/2017.  . Cancer ([place])   . Celiac disease   . Hyperlipidemia 05/11/2017  . Hypertension   . Polycythemia   . Thyroid disease   . TIA (transient ischemic attack)    2017   Past Surgical History:  Procedure Laterality Date  . ABDOMINAL HYSTERECTOMY    . BLADDER SURGERY     Social History   Socioeconomic History  . Marital status: Single    Spouse name: Not on file  . Number of children: Not on file  . Years of education: Not on file  . Highest education level: Not on file  Occupational History  . Not on file  Social Needs  . Financial resource strain: Not on file  . Food insecurity:    Worry: Not on file    Inability: Not on file  . Transportation needs:    Medical: Not on file    Non-medical: Not on file  Tobacco Use  . Smoking status: Never Smoker  . Smokeless tobacco: Never Used  Substance and Sexual Activity  . Alcohol use: Yes    Comment: daily  . Drug use: No  . Sexual activity: Not on file  Lifestyle  . Physical activity:    Days per week: Not on file    Minutes per session: Not on  file  . Stress: Not on file  Relationships  . Social connections:    Talks on phone: Not on file    Gets together: Not on file    Attends religious service: Not on file    Active member of club or organization: Not on file    Attends meetings of clubs or organizations: Not on file    Relationship status: Not on file  Other Topics Concern  . Not on file  Social History Narrative  . Not on file   Allergies  Allergen Reactions  . Propantheline Rash    Cannot remember other symptoms   . Atorvastatin     Myalgia   . Sulfa Antibiotics Swelling  . Zetia [Ezetimibe]     Myalgias   . Demerol [Meperidine] Rash  . Morphine And Related Rash   Family History  Problem Relation Age of Onset  . Peripheral Artery Disease Mother   . Dementia Father     Current Outpatient Medications (Endocrine & Metabolic):  .  levothyroxine (SYNTHROID, LEVOTHROID) 125 MCG tablet, Take 1 tablet (125 mcg total) by mouth every other day. (Patient taking differently: Take 125 mcg by mouth daily. )  Current Outpatient  Medications (Cardiovascular):  .  cholestyramine (QUESTRAN) 4 g packet, TAKE 1 PACKET as needed. NO OTHER MEDICATION 2 HOURS BEFORE OR AFTER. .  rosuvastatin (CRESTOR) 5 MG tablet, Take 1 tablet by mouth 3 times weekly  Current Outpatient Medications (Respiratory):  .  beclomethasone (QVAR) 80 MCG/ACT inhaler, Inhale 1 puff into the lungs as needed.   Current Outpatient Medications (Analgesics):  .  aspirin EC 81 MG tablet, Take 81 mg by mouth daily.   Current Outpatient Medications (Other):  .  cholecalciferol (VITAMIN D) 1000 units tablet, Take 1,000 Units by mouth daily. .  Multiple Vitamins-Minerals (MULTIVITAMIN WITH MINERALS) tablet, Take 1 tablet by mouth daily.    Past medical history, social, surgical and family history all reviewed in electronic medical record.  No pertanent information unless stated regarding to the chief complaint.   Review of Systems:  No headache, visual  changes, nausea, vomiting, diarrhea, constipation, dizziness, abdominal pain, skin rash, fevers, chills, night sweats, weight loss, swollen lymph nodes, body aches, joint swelling, muscle aches, chest pain, shortness of breath, mood changes.   Objective  Blood pressure 124/82, pulse (!) 50, height 5' 5"  (1.651 m), weight 165 lb (74.8 kg), SpO2 96 %. Systems examined below as of    General: No apparent distress alert and oriented x3 mood and affect normal, dressed appropriately.  HEENT: Pupils equal, extraocular movements intact  Respiratory: Patient's speak in full sentences and does not appear short of breath  Cardiovascular: No lower extremity edema, non tender, no erythema  Skin: Warm dry intact with no signs of infection or rash on extremities or on axial skeleton.  Abdomen: Soft nontender  Neuro: Cranial nerves II through XII are intact, neurovascularly intact in all extremities with 2+ DTRs and 2+ pulses.  Lymph: No lymphadenopathy of posterior or anterior cervical chain or axillae bilaterally.  Gait normal with good balance and coordination.  MSK:  Non tender with full range of motion and good stability and symmetric strength and tone of shoulders, elbows,  hip, and ankles bilaterally.  Arthritic changes of multiple joints  Right knee exam shows the patient has a trace effusion.  Near full range of motion.  Arthritic changes noted.  Mild crepitus.  Patient's left thumb has positive grind.  Mild weakness in grip strength compared to the contralateral side.  Neurovascularly intact.  Full range of motion of the wrist  Procedure: Real-time Ultrasound Guided Injection of left CMC joint Device: GE Logiq Q7 Ultrasound guided injection is preferred based studies that show increased duration, increased effect, greater accuracy, decreased procedural pain, increased response rate, and decreased cost with ultrasound guided versus blind injection.  Verbal informed consent obtained.  Time-out  conducted.  Noted no overlying erythema, induration, or other signs of local infection.  Skin prepped in a sterile fashion.  Local anesthesia: Topical Ethyl chloride.  With sterile technique and under real time ultrasound guidance: With a 25-gauge half inch needle patient was injected with 0.5 cc of 0.5% Marcaine and 0.5 cc of Kenalog 40 mg/mL Completed without difficulty  Pain immediately resolved suggesting accurate placement of the medication.  Advised to call if fevers/chills, erythema, induration, drainage, or persistent bleeding.  Images permanently stored and available for review in the ultrasound unit.  Impression: Technically successful ultrasound guided injection.    Impression and Recommendations:     This case required medical decision making of moderate complexity. The above documentation has been reviewed and is accurate and complete Lyndal Pulley, DO  Note: This dictation was prepared with Dragon dictation along with smaller phrase technology. Any transcriptional errors that result from this process are unintentional.

## 2019-01-10 NOTE — Assessment & Plan Note (Signed)
Repeat injection given again today.  Tolerated procedure well.  Discussed icing regimen and home exercise.  Discussed bracing.  Patient will continue the other conservative therapies.  Patient wants to avoid surgical intervention.  Follow-up again 6 to 8 weeks.

## 2019-01-11 ENCOUNTER — Encounter: Payer: Self-pay | Admitting: Family Medicine

## 2019-06-24 ENCOUNTER — Encounter: Payer: Self-pay | Admitting: *Deleted

## 2019-06-27 ENCOUNTER — Ambulatory Visit (INDEPENDENT_AMBULATORY_CARE_PROVIDER_SITE_OTHER): Payer: Medicare Other | Admitting: Family Medicine

## 2019-06-27 ENCOUNTER — Other Ambulatory Visit: Payer: Self-pay

## 2019-06-27 ENCOUNTER — Ambulatory Visit (INDEPENDENT_AMBULATORY_CARE_PROVIDER_SITE_OTHER)
Admission: RE | Admit: 2019-06-27 | Discharge: 2019-06-27 | Disposition: A | Discharge status: Home / Self Care | Payer: Medicare Other | Source: Ambulatory Visit | Attending: Family Medicine | Admitting: Family Medicine

## 2019-06-27 ENCOUNTER — Encounter: Payer: Self-pay | Admitting: Family Medicine

## 2019-06-27 VITALS — BP 124/78 | HR 61 | Ht 65.0 in | Wt 162.8 lb

## 2019-06-27 DIAGNOSIS — M25551 Pain in right hip: Secondary | ICD-10-CM

## 2019-06-27 NOTE — Patient Instructions (Addendum)
Good to see you.   X-ray downstairs before you leave  Ice 20 minutes 2 times daily. Usually after activity and before bed.  Exercises 3 times a week.     Tart cherry extract 1283m at night Vitamin D 2000 IU daily   Please write me or message me in 2 weeks to let me know how you're doing  See me again in 4-6 weeks

## 2019-06-27 NOTE — Assessment & Plan Note (Signed)
Right hip pain.  Discussed which activities to do which wants to avoid.  Patient likely having increasing discomfort in medial aspect of the knee.  I am concerned that the patient actually has more of a hip arthritis.  Will get x-rays to rule out arthritis and know the severity as well as make sure there is not an insufficiency fracture.  Significant weakness noted.  Patient has had back problems previously but.  Strength of the lower extremity at the foot but not at the hip itself.  Patient and mom come back and we can consider the possibility of injections with surgical intervention may be necessary

## 2019-06-27 NOTE — Progress Notes (Signed)
[name] [place], [place] [address] Phone: [phone] Subjective:       CC: R hip  UMP:[id number]    I, Wendy Poet, LAT, ATC, am serving as scribe for Dr. Hulan Saas.    Update- 06/27/19 Julie [name] is a 78 y.o. female coming in with complaint of R hip pain.  Pain started in the R SIJ and then progressed to R groin pain over the past few weeks, noting difficulty w/ actively moving her R hip into flexion to get in/out of the car.  Pt states that her R leg and hip feel weak when she's walking but denies any actual hip/groin pain w/ walking.  Aggravating factors include active R hip flexion, attempting to stretch her R hip and climbing stairs.  Pt states that she has tried Pennsaid which did help and she has been taking Aleve daily.  She denies any radiating R LE pain or radicular symptoms.      Past Medical History:  Diagnosis Date  . Asthma   . CAD in native artery 06/06/2017   Mild LAD disease on coronary CT-A 05/2017.  . Cancer ([place])   . Celiac disease   . Hyperlipidemia 05/11/2017  . Hypertension   . Polycythemia   . 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 Dellis Filbert)  . Thyroid disease   . TIA (transient ischemic attack)    2017   Past Surgical History:  Procedure Laterality Date  . ABDOMINAL HYSTERECTOMY    . BLADDER SURGERY     Social History   Socioeconomic History  . Marital status: Single    Spouse name: Not on file  . Number of children: Not on file  . Years of education: Not on file  . Highest education level: Not on file  Occupational History  . Not on file  Social Needs  . Financial resource strain: Not on file  . Food insecurity    Worry: Not on file    Inability: Not on file  . Transportation needs    Medical: Not on file    Non-medical: Not on file  Tobacco Use  . Smoking status: Never Smoker  . Smokeless tobacco: Never Used  Substance and Sexual Activity  . Alcohol  use: Yes    Comment: daily  . Drug use: No  . Sexual activity: Not on file  Lifestyle  . Physical activity    Days per week: Not on file    Minutes per session: Not on file  . Stress: Not on file  Relationships  . Social Herbalist on phone: Not on file    Gets together: Not on file    Attends religious service: Not on file    Active member of club or organization: Not on file    Attends meetings of clubs or organizations: Not on file    Relationship status: Not on file  Other Topics Concern  . Not on file  Social History Narrative  . Not on file   Allergies  Allergen Reactions  . Propantheline Rash    Cannot remember other symptoms   . Atorvastatin     Myalgia   . Sulfa Antibiotics Swelling  . Zetia [Ezetimibe]     Myalgias   . Demerol [Meperidine] Rash  . Morphine And Related Rash   Family History  Problem Relation Age of Onset  . Peripheral Artery Disease Mother   . Dementia  Father     Current Outpatient Medications (Endocrine & Metabolic):  .  levothyroxine (SYNTHROID, LEVOTHROID) 125 MCG tablet, Take 1 tablet (125 mcg total) by mouth every other day. (Patient taking differently: Take 125 mcg by mouth daily. )  Current Outpatient Medications (Cardiovascular):  .  cholestyramine (QUESTRAN) 4 g packet, TAKE 1 PACKET as needed. NO OTHER MEDICATION 2 HOURS BEFORE OR AFTER. .  rosuvastatin (CRESTOR) 5 MG tablet, Take 1 tablet by mouth 3 times weekly  Current Outpatient Medications (Respiratory):  .  beclomethasone (QVAR) 80 MCG/ACT inhaler, Inhale 1 puff into the lungs as needed.   Current Outpatient Medications (Analgesics):  .  aspirin EC 81 MG tablet, Take 81 mg by mouth daily.   Current Outpatient Medications (Other):  .  cholecalciferol (VITAMIN D) 1000 units tablet, Take 1,000 Units by mouth daily. .  Multiple Vitamins-Minerals (MULTIVITAMIN WITH MINERALS) tablet, Take 1 tablet by mouth daily.    Past medical history, social, surgical and  family history all reviewed in electronic medical record.  No pertanent information unless stated regarding to the chief complaint.   Review of Systems:  No headache, visual changes, nausea, vomiting, diarrhea, constipation, dizziness, abdominal pain, skin rash, fevers, chills, night sweats, weight loss, swollen lymph nodes, body aches, joint swelling, muscle aches, chest pain, shortness of breath, mood changes.   Objective  Blood pressure 124/78, pulse 61, height 5' 5"  (1.651 m), weight 162 lb 12.8 oz (73.8 kg), SpO2 95 %.    General: No apparent distress alert and oriented x3 mood and affect normal, dressed appropriately.  HEENT: Pupils equal, extraocular movements intact  Respiratory: Patient's speak in full sentences and does not appear short of breath  Cardiovascular: No lower extremity edema, non tender, no erythema  Skin: Warm dry intact with no signs of infection or rash on extremities or on axial skeleton.  Abdomen: Soft nontender  Neuro: Cranial nerves II through XII are intact, neurovascularly intact in all extremities with 2+ DTRs and 2+ pulses.  Lymph: No lymphadenopathy of posterior or anterior cervical chain or axillae bilaterally.  Gait severely antalgic MSK:  tender with limited range of motion and good stability and symmetric strength and tone of shoulders, elbows, wrist,  knee and ankles bilaterally.  Right hip exam no internal range of motion with severe pain in the groin.  No pain with resisted adduction.  Patient has 2+ out of 5 strength of hip flexion noted.  Patient has 5 out of 5 strength at the feet bilaterally.  Deep tendon reflexes do appear to be intact.  Back exam mild pain over the sacroiliac joint.   Impression and Recommendations:     This case required medical decision making of moderate complexity. The above documentation has been reviewed and is accurate and complete Lyndal Pulley, DO       Note: This dictation was prepared with Dragon dictation  along with smaller phrase technology. Any transcriptional errors that result from this process are unintentional.

## 2019-07-10 ENCOUNTER — Encounter: Payer: Self-pay | Admitting: Family Medicine

## 2019-07-11 ENCOUNTER — Other Ambulatory Visit: Payer: Self-pay

## 2019-07-11 DIAGNOSIS — M25561 Pain in right knee: Secondary | ICD-10-CM

## 2019-07-12 ENCOUNTER — Encounter: Payer: Self-pay | Admitting: Family Medicine

## 2019-07-15 NOTE — Progress Notes (Signed)
[name] [place], [place] [address] Phone: [phone] Subjective:   I [name] am serving as a Education administrator for Dr. Hulan Saas.  CC: Right hip pain follow-up  [id number]   06/27/2019 Right hip pain.  Discussed which activities to do which wants to avoid.  Patient likely having increasing discomfort in medial aspect of the knee.  I am concerned that the patient actually has more of a hip arthritis.  Will get x-rays to rule out arthritis and know the severity as well as make sure there is not an insufficiency fracture.  Significant weakness noted.  Patient has had back problems previously but.  Strength of the lower extremity at the foot but not at the hip itself.  Patient and mom come back and we can consider the possibility of injections with surgical intervention may be necessary  07/16/2019 Julie [name] is a 78 y.o. female coming in with complaint of right hip pain. States she feels about the same. States the pain radiates laterally. Has to modify how she gets out the car. Can't walk far due to right sided back pain.  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  If anything seems to be worsening over the course of time.  Patient's x-rays at last visit were independently visualized by me showing the severe arthritis.  Even daily activities such as putting on shoes has become more more difficult.      Past Medical History:  Diagnosis Date  . Asthma   . CAD in native artery 06/06/2017   Mild LAD disease on coronary CT-A 05/2017.  . Cancer ([place])   . Celiac disease   . Hyperlipidemia 05/11/2017  . Hypertension   . Polycythemia   . 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 Dellis Filbert)  . Thyroid disease   . TIA (transient ischemic attack)    2017   Past Surgical History:  Procedure Laterality Date  . ABDOMINAL HYSTERECTOMY    . BLADDER SURGERY     Social History   Socioeconomic  History  . Marital status: Single    Spouse name: Not on file  . Number of children: Not on file  . Years of education: Not on file  . Highest education level: Not on file  Occupational History  . Not on file  Social Needs  . Financial resource strain: Not on file  . Food insecurity    Worry: Not on file    Inability: Not on file  . Transportation needs    Medical: Not on file    Non-medical: Not on file  Tobacco Use  . Smoking status: Never Smoker  . Smokeless tobacco: Never Used  Substance and Sexual Activity  . Alcohol use: Yes    Comment: daily  . Drug use: No  . Sexual activity: Not on file  Lifestyle  . Physical activity    Days per week: Not on file    Minutes per session: Not on file  . Stress: Not on file  Relationships  . Social Herbalist on phone: Not on file    Gets together: Not on file    Attends religious service: Not on file    Active member of club or organization: Not on file    Attends meetings of clubs or organizations: Not on file    Relationship status: Not on file  Other Topics Concern  . Not  on file  Social History Narrative  . Not on file   Allergies  Allergen Reactions  . Propantheline Rash    Cannot remember other symptoms   . Atorvastatin     Myalgia   . Sulfa Antibiotics Swelling  . Zetia [Ezetimibe]     Myalgias   . Demerol [Meperidine] Rash  . Morphine And Related Rash   Family History  Problem Relation Age of Onset  . Peripheral Artery Disease Mother   . Dementia Father     Current Outpatient Medications (Endocrine & Metabolic):  .  levothyroxine (SYNTHROID, LEVOTHROID) 125 MCG tablet, Take 1 tablet (125 mcg total) by mouth every other day. (Patient taking differently: Take 125 mcg by mouth daily. )  Current Outpatient Medications (Cardiovascular):  .  cholestyramine (QUESTRAN) 4 g packet, TAKE 1 PACKET as needed. NO OTHER MEDICATION 2 HOURS BEFORE OR AFTER. .  rosuvastatin (CRESTOR) 5 MG tablet, Take 1  tablet by mouth 3 times weekly  Current Outpatient Medications (Respiratory):  .  beclomethasone (QVAR) 80 MCG/ACT inhaler, Inhale 1 puff into the lungs as needed.   Current Outpatient Medications (Analgesics):  .  aspirin EC 81 MG tablet, Take 81 mg by mouth daily.   Current Outpatient Medications (Other):  .  cholecalciferol (VITAMIN D) 1000 units tablet, Take 1,000 Units by mouth daily. .  Multiple Vitamins-Minerals (MULTIVITAMIN WITH MINERALS) tablet, Take 1 tablet by mouth daily.    Past medical history, social, surgical and family history all reviewed in electronic medical record.  No pertanent information unless stated regarding to the chief complaint.   Review of Systems:  No headache, visual changes, nausea, vomiting, diarrhea, constipation, dizziness, abdominal pain, skin rash, fevers, chills, night sweats, weight loss, swollen lymph nodes, body aches, joint swelling,chest pain, shortness of breath, mood changes.  Positive muscle aches  Objective  Blood pressure 130/80, pulse 60, height 5' 5"  (1.651 m), weight 162 lb (73.5 kg), SpO2 97 %.    General: No apparent distress alert and oriented x3 mood and affect normal, dressed appropriately.  HEENT: Pupils equal, extraocular movements intact  Respiratory: Patient's speak in full sentences and does not appear short of breath  Cardiovascular: No lower extremity edema, non tender, no erythema  Skin: Warm dry intact with no signs of infection or rash on extremities or on axial skeleton.  Abdomen: Soft nontender  Neuro: Cranial nerves II through XII are intact, neurovascularly intact in all extremities with 2+ DTRs and 2+ pulses.  Lymph: No lymphadenopathy of posterior or anterior cervical chain or axillae bilaterally.  Gait antalgic MSK:  tender with full range of motion and good stability and symmetric strength and tone of shoulders, elbows, wrist, and ankles bilaterally.  Arthritic changes of multiple joints Right knee exam  shows the patient does have a replacement on the side.  No swelling noted.  Right hip exam motions are patient does have decreased range of motion severely.  Patient has a 2 degrees of internal range of motion which is worse than previous exam.  4-5 strength noted. Negative straight leg test.  Decreased external range of motion as well.     Impression and Recommendations:      The above documentation has been reviewed and is accurate and complete Lyndal Pulley, DO       Note: This dictation was prepared with Dragon dictation along with smaller phrase technology. Any transcriptional errors that result from this process are unintentional.

## 2019-07-16 ENCOUNTER — Ambulatory Visit (INDEPENDENT_AMBULATORY_CARE_PROVIDER_SITE_OTHER): Payer: Medicare Other | Admitting: Family Medicine

## 2019-07-16 ENCOUNTER — Encounter: Payer: Self-pay | Admitting: Family Medicine

## 2019-07-16 DIAGNOSIS — M1611 Unilateral primary osteoarthritis, right hip: Secondary | ICD-10-CM

## 2019-07-16 NOTE — Assessment & Plan Note (Signed)
Severe right hip arthritis and seems to be causing patient to decompensate quickly.  Patient is highly active for a 78 year old female wants to continue to do so.  We discussed which activities to do which wants to avoid.  Patient at this point I do not feel that injection will make a significant difference and would only delay the inevitable patient needing a hip replacement so she can stay active.  Patient has an appointment with an orthopedic surgeon tomorrow and encouraged her to discuss.  Patient can follow-up with me if she has any other questions.  Spent  25 minutes with patient face-to-face and had greater than 50% of counseling including as described above in assessment and plan.

## 2019-07-17 ENCOUNTER — Encounter: Payer: Self-pay | Admitting: Family Medicine

## 2019-07-17 ENCOUNTER — Telehealth: Payer: Self-pay

## 2019-07-17 ENCOUNTER — Other Ambulatory Visit: Payer: Self-pay | Admitting: Orthopaedic Surgery

## 2019-07-17 NOTE — Telephone Encounter (Signed)
   [facility] Pre-operative Risk Assessment    Request for surgical clearance:  1. What type of surgery is being performed? RIGHT TOTAL HIP ARTHROPLASTY    2. When is this surgery scheduled? 08-06-2019   3. What type of clearance is required (medical clearance vs. Pharmacy clearance to hold med vs. Both)? MEDICAL  4. Are there any medications that need to be held prior to surgery and how long? NONE   5. Practice name and name of physician performing surgery? [facility] DR Dalldorf ATTN:REBECCA   6. What is your office phone number [phone]    7.   What is your office fax number [phone]  8.   Anesthesia type (None, local, MAC, general) ? Spinal

## 2019-07-17 NOTE — Telephone Encounter (Signed)
   [place], MD  Chart reviewed as part of pre-operative protocol coverage. Because of Rodina A Sweetser's past medical history and time since last visit, she will require a follow-up visit in order to better assess preoperative cardiovascular risk.  Pre-op covering staff: - Please schedule appointment and call patient to inform them. - Please contact requesting surgeon's office via preferred method (i.e, phone, fax) to inform them of need for appointment prior to surgery.   Abigail Butts, PA-C  07/17/2019, 3:25 PM

## 2019-07-17 NOTE — Telephone Encounter (Signed)
Successfully sent via Epic fax to requesting office.

## 2019-07-18 ENCOUNTER — Telehealth: Payer: Self-pay | Admitting: Cardiovascular Disease

## 2019-07-18 NOTE — Telephone Encounter (Signed)
° ° ° ° ° °  Did not need this encounter

## 2019-07-30 ENCOUNTER — Ambulatory Visit: Payer: Medicare Other | Admitting: Family Medicine

## 2019-07-30 ENCOUNTER — Encounter (HOSPITAL_COMMUNITY): Payer: Self-pay

## 2019-07-30 NOTE — Patient Instructions (Addendum)
DUE TO COVID-19 ONLY ONE VISITOR IS ALLOWED TO COME WITH YOU AND STAY IN THE WAITING ROOM ONLY DURING PRE OP AND PROCEDURE DAY OF SURGERY. THE 1 VISITOR MAY VISIT WITH YOU AFTER SURGERY IN YOUR PRIVATE ROOM DURING VISITING HOURS ONLY!  YOU NEED TO HAVE A COVID 19 TEST ON_______ @_______ , THIS TEST MUST BE DONE BEFORE SURGERY, COME  [place], [place] , [address].  ([place]) ONCE YOUR COVID TEST IS COMPLETED, PLEASE BEGIN THE QUARANTINE INSTRUCTIONS AS OUTLINED IN YOUR HANDOUT.                Julie [name]  07/30/2019   Your procedure is scheduled on: 08-06-19   Report to [facility] Main  Entrance   Report to admitting at        0730  AM     Call this number if you have problems the morning of surgery [phone]    Remember:NO SOLID FOOD AFTER MIDNIGHT THE NIGHT PRIOR TO SURGERY. NOTHING BY MOUTH EXCEPT CLEAR LIQUIDS UNTIL     0730  am . PLEASE FINISH ENSURE DRINK PER SURGEON ORDER  WHICH NEEDS TO BE COMPLETED AT      0730  am then nothing by mouth.    CLEAR LIQUID DIET   Foods Allowed                                                                     Foods Excluded  Coffee and tea, regular and decaf                             liquids that you cannot  Plain Jell-O any favor except red or purple                                           see through such as: Fruit ices (not with fruit pulp)                                     milk, soups, orange juice  Iced Popsicles                                    All solid food Carbonated beverages, regular and diet                                    Cranberry, grape and apple juices Sports drinks like Gatorade Lightly seasoned clear broth or consume(fat free) Sugar, honey syrup   ____________________________________________________________________     BRUSH YOUR TEETH MORNING OF SURGERY AND RINSE YOUR MOUTH OUT, NO CHEWING GUM CANDY OR MINTS.     Take these medicines the morning of surgery with  A SIP OF WATER: NONE  You may not have any metal on your body including hair pins and              piercings  Do not wear jewelry, make-up, lotions, powders or perfumes, deodorant             Do not wear nail polish on your fingernails.  Do not shave  48 hours prior to surgery.         Do not bring valuables to the hospital. [place].  Contacts, dentures or bridgework may not be worn into surgery.                  Please read over the following fact sheets you were given: _____________________________________________________________________           [facility] - Preparing for Surgery Before surgery, you can play an important role.  Because skin is not sterile, your skin needs to be as free of germs as possible.  You can reduce the number of germs on your skin by washing with CHG (chlorahexidine gluconate) soap before surgery.  CHG is an antiseptic cleaner which kills germs and bonds with the skin to continue killing germs even after washing. Please DO NOT use if you have an allergy to CHG or antibacterial soaps.  If your skin becomes reddened/irritated stop using the CHG and inform your nurse when you arrive at Short Stay. Do not shave (including legs and underarms) for at least 48 hours prior to the first CHG shower.  You may shave your face/neck. Please follow these instructions carefully:  1.  Shower with CHG Soap the night before surgery and the  morning of Surgery.  2.  If you choose to wash your hair, wash your hair first as usual with your  normal  shampoo.  3.  After you shampoo, rinse your hair and body thoroughly to remove the  shampoo.                           4.  Use CHG as you would any other liquid soap.  You can apply chg directly  to the skin and wash                       Gently with a scrungie or clean washcloth.  5.  Apply the CHG Soap to your body ONLY FROM THE NECK DOWN.   Do not use  on face/ open                           Wound or open sores. Avoid contact with eyes, ears mouth and genitals (private parts).                       Wash face,  Genitals (private parts) with your normal soap.             6.  Wash thoroughly, paying special attention to the area where your surgery  will be performed.  7.  Thoroughly rinse your body with warm water from the neck down.  8.  DO NOT shower/wash with your normal soap after using and rinsing off  the CHG Soap.                9.  Pat yourself dry  with a clean towel.            10.  Wear clean pajamas.            11.  Place clean sheets on your bed the night of your first shower and do not  sleep with pets. Day of Surgery : Do not apply any lotions/deodorants the morning of surgery.  Please wear clean clothes to the hospital/surgery center.  FAILURE TO FOLLOW THESE INSTRUCTIONS MAY RESULT IN THE CANCELLATION OF YOUR SURGERY PATIENT SIGNATURE_________________________________  NURSE SIGNATURE__________________________________  ________________________________________________________________________   Julie [name]  An incentive spirometer is a tool that can help keep your lungs clear and active. This tool measures how well you are filling your lungs with each breath. Taking long deep breaths may help reverse or decrease the chance of developing breathing (pulmonary) problems (especially infection) following:  A long period of time when you are unable to move or be active. BEFORE THE PROCEDURE   If the spirometer includes an indicator to show your best effort, your nurse or respiratory therapist will set it to a desired goal.  If possible, sit up straight or lean slightly forward. Try not to slouch.  Hold the incentive spirometer in an upright position. INSTRUCTIONS FOR USE  1. Sit on the edge of your bed if possible, or sit up as far as you can in bed or on a chair. 2. Hold the incentive spirometer in an upright  position. 3. Breathe out normally. 4. Place the mouthpiece in your mouth and seal your lips tightly around it. 5. Breathe in slowly and as deeply as possible, raising the piston or the ball toward the top of the column. 6. Hold your breath for 3-5 seconds or for as long as possible. Allow the piston or ball to fall to the bottom of the column. 7. Remove the mouthpiece from your mouth and breathe out normally. 8. Rest for a few seconds and repeat Steps 1 through 7 at least 10 times every 1-2 hours when you are awake. Take your time and take a few normal breaths between deep breaths. 9. The spirometer may include an indicator to show your best effort. Use the indicator as a goal to work toward during each repetition. 10. After each set of 10 deep breaths, practice coughing to be sure your lungs are clear. If you have an incision (the cut made at the time of surgery), support your incision when coughing by placing a pillow or rolled up towels firmly against it. Once you are able to get out of bed, walk around indoors and cough well. You may stop using the incentive spirometer when instructed by your caregiver.  RISKS AND COMPLICATIONS  Take your time so you do not get dizzy or light-headed.  If you are in pain, you may need to take or ask for pain medication before doing incentive spirometry. It is harder to take a deep breath if you are having pain. AFTER USE  Rest and breathe slowly and easily.  It can be helpful to keep track of a log of your progress. Your caregiver can provide you with a simple table to help with this. If you are using the spirometer at home, follow these instructions: [place] IF:   You are having difficultly using the spirometer.  You have trouble using the spirometer as often as instructed.  Your pain medication is not giving enough relief while using the spirometer.  You develop fever of 100.5 F (38.1  C) or higher. SEEK IMMEDIATE MEDICAL CARE IF:    You cough up bloody sputum that had not been present before.  You develop fever of 102 F (38.9 C) or greater.  You develop worsening pain at or near the incision site. MAKE SURE YOU:   Understand these instructions.  Will watch your condition.  Will get help right away if you are not doing well or get worse. Document Released: 01/02/2007 Document Revised: 11/14/2011 Document Reviewed: 03/05/2007 ExitCare Patient Information 2014 Exit[employer], [place].   ________________________________________________________________________  WHAT IS A BLOOD TRANSFUSION? Blood Transfusion Information  A transfusion is the replacement of blood or some of its parts. Blood is made up of multiple cells which provide different functions.  Red blood cells carry oxygen and are used for blood loss replacement.  White blood cells fight against infection.  Platelets control bleeding.  Plasma helps clot blood.  Other blood products are available for specialized needs, such as hemophilia or other clotting disorders. BEFORE THE TRANSFUSION  Who gives blood for transfusions?   Healthy volunteers who are fully evaluated to make sure their blood is safe. This is blood bank blood. Transfusion therapy is the safest it has ever been in the practice of medicine. Before blood is taken from a donor, a complete history is taken to make sure that person has no history of diseases nor engages in risky social behavior (examples are intravenous drug use or sexual activity with multiple partners). The donor's travel history is screened to minimize risk of transmitting infections, such as malaria. The donated blood is tested for signs of infectious diseases, such as HIV and hepatitis. The blood is then tested to be sure it is compatible with you in order to minimize the chance of a transfusion reaction. If you or a relative donates blood, this is often done in anticipation of surgery and is not appropriate for emergency  situations. It takes many days to process the donated blood. RISKS AND COMPLICATIONS Although transfusion therapy is very safe and saves many lives, the main dangers of transfusion include:   Getting an infectious disease.  Developing a transfusion reaction. This is an allergic reaction to something in the blood you were given. Every precaution is taken to prevent this. The decision to have a blood transfusion has been considered carefully by your caregiver before blood is given. Blood is not given unless the benefits outweigh the risks. AFTER THE TRANSFUSION  Right after receiving a blood transfusion, you will usually feel much better and more energetic. This is especially true if your red blood cells have gotten low (anemic). The transfusion raises the level of the red blood cells which carry oxygen, and this usually causes an energy increase.  The nurse administering the transfusion will monitor you carefully for complications. HOME CARE INSTRUCTIONS  No special instructions are needed after a transfusion. You may find your energy is better. Speak with your caregiver about any limitations on activity for underlying diseases you may have. SEEK MEDICAL CARE IF:   Your condition is not improving after your transfusion.  You develop redness or irritation at the intravenous (IV) site. SEEK IMMEDIATE MEDICAL CARE IF:  Any of the following symptoms occur over the next 12 hours:  Shaking chills.  You have a temperature by mouth above 102 F (38.9 C), not controlled by medicine.  Chest, back, or muscle pain.  People around you feel you are not acting correctly or are confused.  Shortness of breath or difficulty breathing.  Dizziness  and fainting.  You get a rash or develop hives.  You have a decrease in urine output.  Your urine turns a dark color or changes to pink, red, or brown. Any of the following symptoms occur over the next 10 days:  You have a temperature by mouth above  102 F (38.9 C), not controlled by medicine.  Shortness of breath.  Weakness after normal activity.  The white part of the eye turns yellow (jaundice).  You have a decrease in the amount of urine or are urinating less often.  Your urine turns a dark color or changes to pink, red, or brown. Document Released: 08/19/2000 Document Revised: 11/14/2011 Document Reviewed: 04/07/2008 [facility] Patient Information 2014 [place], [place].  _______________________________________________________________________

## 2019-07-31 ENCOUNTER — Encounter: Payer: Self-pay | Admitting: Cardiology

## 2019-07-31 ENCOUNTER — Ambulatory Visit (INDEPENDENT_AMBULATORY_CARE_PROVIDER_SITE_OTHER): Payer: Medicare Other | Admitting: Cardiology

## 2019-07-31 ENCOUNTER — Other Ambulatory Visit: Payer: Self-pay

## 2019-07-31 VITALS — BP 132/78 | HR 51 | Temp 97.0°F | Ht 65.0 in | Wt 162.0 lb

## 2019-07-31 DIAGNOSIS — I2510 Atherosclerotic heart disease of native coronary artery without angina pectoris: Secondary | ICD-10-CM | POA: Diagnosis not present

## 2019-07-31 DIAGNOSIS — Z01818 Encounter for other preprocedural examination: Secondary | ICD-10-CM | POA: Insufficient documentation

## 2019-07-31 DIAGNOSIS — G459 Transient cerebral ischemic attack, unspecified: Secondary | ICD-10-CM

## 2019-07-31 DIAGNOSIS — E785 Hyperlipidemia, unspecified: Secondary | ICD-10-CM

## 2019-07-31 NOTE — Patient Instructions (Signed)
Medication Instructions:  Your physician recommends that you continue on your current medications as directed. Please refer to the Current Medication list given to you today. *If you need a refill on your cardiac medications before your next appointment, please call your pharmacy*  Lab Work: None  If you have labs (blood work) drawn today and your tests are completely normal, you will receive your results only by: Marland Kitchen MyChart Message (if you have MyChart) OR . A paper copy in the mail If you have any lab test that is abnormal or we need to change your treatment, we will call you to review the results.  Testing/Procedures: None   Follow-Up: At [facility], you and your health needs are our priority.  As part of our continuing mission to provide you with exceptional heart care, we have created designated Provider Care Teams.  These Care Teams include your primary Cardiologist (physician) and Advanced Practice Providers (APPs -  Physician Assistants and Nurse Practitioners) who all work together to provide you with the care you need, when you need it.  Your next appointment:   12 month(s)  The format for your next appointment:   Virtual Visit   Provider:   Skeet Latch, MD  Other Instructions

## 2019-07-31 NOTE — Assessment & Plan Note (Signed)
High dose statin intolerant, she declined PCSK9-tolerating Crestor 5 mg 3 x week

## 2019-07-31 NOTE — Telephone Encounter (Signed)
   Primary Cardiologist: Skeet Latch, MD  Chart reviewed and patient seen in the office today as part of pre-operative protocol coverage. Given past medical history and time since last visit, based on ACC/AHA guidelines, [place] would be at acceptable risk for the planned procedure without further cardiovascular testing.   I will route this recommendation to the requesting party via Epic fax function and remove from pre-op pool.  Please call with questions.  Kerin Ransom, PA-C 07/31/2019, 9:06 AM

## 2019-07-31 NOTE — Assessment & Plan Note (Signed)
H/O TIA 2017- MRI negative

## 2019-07-31 NOTE — Assessment & Plan Note (Signed)
Mild LAD disease on coronary CT-A 05/2017.

## 2019-07-31 NOTE — Assessment & Plan Note (Signed)
Pre op clearance for Rt hip surgery

## 2019-07-31 NOTE — Progress Notes (Signed)
Cardiology Office Note:    Date:  07/31/2019   ID:  Malachy Chamber, DOB 1941/02/17, MRN [id number]  PCP:  Velna Hatchet, MD  Cardiologist:  Skeet Latch, MD  Electrophysiologist:  None   Referring MD: Velna Hatchet, MD   Chief Complaint  Patient presents with  . Pre-op Exam    History of Present Illness:    Julie [name] is a 78 y.o. female with a hx of prior evaluation for an abnormal EKG.  She had a coronary CTA in 2018 that showed mild nonobstructive coronary disease.  She does have dyslipidemia and has been intolerant to higher dose statins.  She is currently taking Crestor 3 times a week and tolerating that.  Prior echocardiogram in 2017 showed an ejection fraction of 55 to 60%.  Patient is here in the office today for preop clearance prior to hip surgery.  Since we saw her last she has been doing well.  She plays a lot of golf and has been doing some camping.  She denies any chest pain or unusual dyspnea.  Past Medical History:  Diagnosis Date  . Asthma   . CAD in native artery 06/06/2017   Mild LAD disease on coronary CT-A 05/2017.  . Cancer ([place])   . Celiac disease   . Hyperlipidemia 05/11/2017  . Hypertension   . Polycythemia   . 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 Dellis Filbert)  . Thyroid disease   . TIA (transient ischemic attack)    2017    Past Surgical History:  Procedure Laterality Date  . ABDOMINAL HYSTERECTOMY    . BLADDER SURGERY      Current Medications: Current Meds  Medication Sig  . aspirin EC 81 MG tablet Take 81 mg by mouth daily. Currently taking 2 asa qd per surgeon for upcoming surgery  . cholecalciferol (VITAMIN D) 1000 units tablet Take 1,000 Units by mouth daily.  . Cyanocobalamin (B-12 SL) Place 1 capsule under the tongue daily.  Marland Kitchen levothyroxine (SYNTHROID, LEVOTHROID) 125 MCG tablet Take 1 tablet (125 mcg total) by mouth every other day. (Patient taking differently: Take 125 mcg by mouth every evening. )   . Misc Natural Products (TART CHERRY ADVANCED) CAPS Take 1 capsule by mouth at bedtime.  . Multiple Vitamins-Minerals (MULTIVITAMIN WITH MINERALS) tablet Take 1 tablet by mouth daily.  . naproxen sodium (ALEVE) 220 MG tablet Take 440 mg by mouth 2 (two) times daily as needed (pain).  . rosuvastatin (CRESTOR) 5 MG tablet Take 1 tablet by mouth 3 times weekly (Patient taking differently: Take 5 mg by mouth every Monday, Wednesday, and Friday. At night)     Allergies:   Propantheline, Atorvastatin, Gluten meal, Sulfa antibiotics, Zetia [ezetimibe], Demerol [meperidine], and Morphine and related   Social History   Socioeconomic History  . Marital status: Single    Spouse name: Not on file  . Number of children: Not on file  . Years of education: Not on file  . Highest education level: Not on file  Occupational History  . Not on file  Social Needs  . Financial resource strain: Not on file  . Food insecurity    Worry: Not on file    Inability: Not on file  . Transportation needs    Medical: Not on file    Non-medical: Not on file  Tobacco Use  . Smoking status: Never Smoker  . Smokeless tobacco: Never Used  Substance and Sexual Activity  . Alcohol use:  Yes    Comment: daily  . Drug use: No  . Sexual activity: Not on file  Lifestyle  . Physical activity    Days per week: Not on file    Minutes per session: Not on file  . Stress: Not on file  Relationships  . Social Herbalist on phone: Not on file    Gets together: Not on file    Attends religious service: Not on file    Active member of club or organization: Not on file    Attends meetings of clubs or organizations: Not on file    Relationship status: Not on file  Other Topics Concern  . Not on file  Social History Narrative  . Not on file     Family History: The patient's family history includes Dementia in her father; Peripheral Artery Disease in her mother.  ROS:   Please see the history of present  illness.     All other systems reviewed and are negative.  EKGs/Labs/Other Studies Reviewed:    The following studies were reviewed today: Coronary CTA Sept 2018  EKG:  EKG is ordered today.  The ekg ordered today demonstrates NSR-HR 52, LAD  Recent Labs: No results found for requested labs within last 8760 hours.  Recent Lipid Panel    Component Value Date/Time   CHOL 220 (H) 02/07/2018 0823   TRIG 132 02/07/2018 0823   HDL 52 02/07/2018 0823   CHOLHDL 4.2 02/07/2018 0823   CHOLHDL 4.8 10/05/2015 1349   VLDL 44 (H) 10/05/2015 1349   LDLCALC 142 (H) 02/07/2018 0823    Physical Exam:    VS:  BP 132/78   Pulse (!) 51   Temp (!) 97 F (36.1 C) (Temporal)   Ht 5' 5"  (1.651 m)   Wt 162 lb (73.5 kg)   SpO2 97%   BMI 26.96 kg/m     Wt Readings from Last 3 Encounters:  07/31/19 162 lb (73.5 kg)  07/16/19 162 lb (73.5 kg)  06/27/19 162 lb 12.8 oz (73.8 kg)     GEN:  Well nourished, well developed in no acute distress HEENT: Normal NECK: No JVD; No carotid bruits CARDIAC: RRR, no murmurs, rubs, gallops RESPIRATORY:  Clear to auscultation without rales, wheezing or rhonchi  ABDOMEN: Soft, non-tender, non-distended MUSCULOSKELETAL:  No edema; No deformity  SKIN: Warm and dry NEUROLOGIC:  Alert and oriented x 3 PSYCHIATRIC:  Normal affect   ASSESSMENT:    Pre-operative clearance Pre op clearance for Rt hip surgery  CAD in native artery Mild LAD disease on coronary CT-A 05/2017.  Dyslipidemia, goal LDL below 70 High dose statin intolerant, she declined PCSK9-tolerating Crestor 5 mg 3 x week  TIA (transient ischemic attack) H/O TIA 2017- MRI negative  PLAN:    OK for hip surgery from cardiology standpoint. I will fax official clearance via Epic. OK to f/u in a year with dr Oval Linsey.    Medication Adjustments/Labs and Tests Ordered: Current medicines are reviewed at length with the patient today.  Concerns regarding medicines are outlined above.  Orders Placed  This Encounter  Procedures  . EKG 12-Lead   No orders of the defined types were placed in this encounter.   Patient Instructions  Medication Instructions:  Your physician recommends that you continue on your current medications as directed. Please refer to the Current Medication list given to you today. *If you need a refill on your cardiac medications before your next appointment, please call your  pharmacy*  Lab Work: None  If you have labs (blood work) drawn today and your tests are completely normal, you will receive your results only by: Marland Kitchen MyChart Message (if you have MyChart) OR . A paper copy in the mail If you have any lab test that is abnormal or we need to change your treatment, we will call you to review the results.  Testing/Procedures: None   Follow-Up: At [facility], you and your health needs are our priority.  As part of our continuing mission to provide you with exceptional heart care, we have created designated Provider Care Teams.  These Care Teams include your primary Cardiologist (physician) and Advanced Practice Providers (APPs -  Physician Assistants and Nurse Practitioners) who all work together to provide you with the care you need, when you need it.  Your next appointment:   12 month(s)  The format for your next appointment:   Virtual Visit   Provider:   Skeet Latch, MD  Other Instructions      Signed, Kerin Ransom, PA-C  07/31/2019 9:00 AM    [place]

## 2019-08-02 ENCOUNTER — Other Ambulatory Visit (HOSPITAL_COMMUNITY)
Admission: RE | Admit: 2019-08-02 | Discharge: 2019-08-02 | Disposition: A | Discharge status: Home / Self Care | Payer: Medicare Other | Source: Ambulatory Visit | Attending: Orthopaedic Surgery | Admitting: Orthopaedic Surgery

## 2019-08-02 DIAGNOSIS — Z01812 Encounter for preprocedural laboratory examination: Secondary | ICD-10-CM | POA: Diagnosis present

## 2019-08-02 DIAGNOSIS — Z20828 Contact with and (suspected) exposure to other viral communicable diseases: Secondary | ICD-10-CM | POA: Insufficient documentation

## 2019-08-02 LAB — SARS CORONAVIRUS 2 (TAT 6-24 HRS): SARS Coronavirus 2: NEGATIVE

## 2019-08-05 ENCOUNTER — Encounter (HOSPITAL_COMMUNITY): Payer: Self-pay

## 2019-08-05 ENCOUNTER — Other Ambulatory Visit: Payer: Self-pay

## 2019-08-05 ENCOUNTER — Encounter (HOSPITAL_COMMUNITY)
Admission: RE | Admit: 2019-08-05 | Discharge: 2019-08-05 | Disposition: A | Discharge status: Home / Self Care | Payer: Medicare Other | Source: Ambulatory Visit | Attending: Orthopaedic Surgery | Admitting: Orthopaedic Surgery

## 2019-08-05 ENCOUNTER — Ambulatory Visit (HOSPITAL_COMMUNITY)
Admission: RE | Admit: 2019-08-05 | Discharge: 2019-08-05 | Disposition: A | Discharge status: Home / Self Care | Payer: Medicare Other | Source: Ambulatory Visit | Attending: Orthopaedic Surgery | Admitting: Orthopaedic Surgery

## 2019-08-05 DIAGNOSIS — E079 Disorder of thyroid, unspecified: Secondary | ICD-10-CM | POA: Insufficient documentation

## 2019-08-05 DIAGNOSIS — I2510 Atherosclerotic heart disease of native coronary artery without angina pectoris: Secondary | ICD-10-CM | POA: Diagnosis not present

## 2019-08-05 DIAGNOSIS — Z8673 Personal history of transient ischemic attack (TIA), and cerebral infarction without residual deficits: Secondary | ICD-10-CM | POA: Insufficient documentation

## 2019-08-05 DIAGNOSIS — E785 Hyperlipidemia, unspecified: Secondary | ICD-10-CM | POA: Insufficient documentation

## 2019-08-05 DIAGNOSIS — Z79899 Other long term (current) drug therapy: Secondary | ICD-10-CM | POA: Insufficient documentation

## 2019-08-05 DIAGNOSIS — M1611 Unilateral primary osteoarthritis, right hip: Secondary | ICD-10-CM | POA: Diagnosis not present

## 2019-08-05 DIAGNOSIS — Z01818 Encounter for other preprocedural examination: Secondary | ICD-10-CM | POA: Insufficient documentation

## 2019-08-05 DIAGNOSIS — Z79890 Hormone replacement therapy (postmenopausal): Secondary | ICD-10-CM | POA: Diagnosis not present

## 2019-08-05 HISTORY — DX: Hypothyroidism, unspecified: E03.9

## 2019-08-05 HISTORY — DX: Unspecified osteoarthritis, unspecified site: M19.90

## 2019-08-05 LAB — CBC WITH DIFFERENTIAL/PLATELET
Abs Immature Granulocytes: 0.01 K/uL (ref 0.00–0.07)
Basophils Absolute: 0 K/uL (ref 0.0–0.1)
Basophils Relative: 1 %
Eosinophils Absolute: 0.1 K/uL (ref 0.0–0.5)
Eosinophils Relative: 3 %
HCT: 45.4 % (ref 36.0–46.0)
Hemoglobin: 14.9 g/dL (ref 12.0–15.0)
Immature Granulocytes: 0 %
Lymphocytes Relative: 33 %
Lymphs Abs: 1.5 K/uL (ref 0.7–4.0)
MCH: 32.4 pg (ref 26.0–34.0)
MCHC: 32.8 g/dL (ref 30.0–36.0)
MCV: 98.7 fL (ref 80.0–100.0)
Monocytes Absolute: 0.5 K/uL (ref 0.1–1.0)
Monocytes Relative: 11 %
Neutro Abs: 2.4 K/uL (ref 1.7–7.7)
Neutrophils Relative %: 52 %
Platelets: 249 K/uL (ref 150–400)
RBC: 4.6 MIL/uL (ref 3.87–5.11)
RDW: 12.6 % (ref 11.5–15.5)
WBC: 4.5 K/uL (ref 4.0–10.5)
nRBC: 0 % (ref 0.0–0.2)

## 2019-08-05 LAB — BASIC METABOLIC PANEL
Anion gap: 8 (ref 5–15)
BUN: 20 mg/dL (ref 8–23)
CO2: 25 mmol/L (ref 22–32)
Calcium: 8.9 mg/dL (ref 8.9–10.3)
Chloride: 107 mmol/L (ref 98–111)
Creatinine, Ser: 0.78 mg/dL (ref 0.44–1.00)
GFR calc Af Amer: >60 mL/min (ref >60)
GFR calc non Af Amer: >60 mL/min (ref >60)
Glucose, Bld: 90 mg/dL (ref 70–99)
Potassium: 4.8 mmol/L (ref 3.5–5.1)
Sodium: 140 mmol/L (ref 135–145)

## 2019-08-05 LAB — PROTIME-INR
INR: 1 (ref 0.8–1.2)
Prothrombin Time: 13 seconds (ref 11.4–15.2)

## 2019-08-05 LAB — SURGICAL PCR SCREEN
MRSA, PCR: NEGATIVE
Staphylococcus aureus: NEGATIVE

## 2019-08-05 LAB — URINALYSIS, ROUTINE W REFLEX MICROSCOPIC
Bilirubin Urine: NEGATIVE
Glucose, UA: NEGATIVE mg/dL
Hgb urine dipstick: NEGATIVE
Ketones, ur: NEGATIVE mg/dL
Leukocytes,Ua: NEGATIVE
Nitrite: NEGATIVE
Protein, ur: NEGATIVE mg/dL
Specific Gravity, Urine: 1.01 (ref 1.005–1.030)
pH: 6 (ref 5.0–8.0)

## 2019-08-05 LAB — ABO/RH: ABO/RH(D): A POS

## 2019-08-05 LAB — APTT: aPTT: 28 seconds (ref 24–36)

## 2019-08-05 MED ORDER — TRANEXAMIC ACID 1000 MG/10ML IV SOLN
2000.0000 mg | INTRAVENOUS | Status: DC
  Filled 2019-08-05: qty 20

## 2019-08-05 MED ORDER — BUPIVACAINE LIPOSOME 1.3 % IJ SUSP
10.0000 mL | Freq: Once | INTRAMUSCULAR | Status: DC
  Filled 2019-08-05: qty 10

## 2019-08-05 NOTE — H&P (Signed)
TOTAL HIP ADMISSION H&P  Patient is admitted for right total hip arthroplasty.  Subjective:  Chief Complaint: right hip pain  HPI: Julie [name], 78 y.o. female, has a history of pain and functional disability in the right hip(s) due to arthritis and patient has failed non-surgical conservative treatments for greater than 12 weeks to include NSAID's and/or analgesics, flexibility and strengthening excercises, use of assistive devices, weight reduction as appropriate and activity modification.  Onset of symptoms was gradual starting 5 years ago with gradually worsening course since that time.The patient noted no past surgery on the right hip(s).  Patient currently rates pain in the right hip at 10 out of 10 with activity. Patient has night pain, worsening of pain with activity and weight bearing, trendelenberg gait, pain that interfers with activities of daily living, pain with passive range of motion and crepitus. Patient has evidence of subchondral cysts, subchondral sclerosis, periarticular osteophytes and joint space narrowing by imaging studies. This condition presents safety issues increasing the risk of falls. There is no current active infection.  Patient Active Problem List   Diagnosis Date Noted  . Pre-operative clearance 07/31/2019  . Arthritis of right hip 07/16/2019  . Right hip pain 06/27/2019  . CAD in native artery 06/06/2017  . Dyslipidemia, goal LDL below 70 05/11/2017  . 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 Closed fracture of middle third of scaphoid bone of left wrist 03/31/2016  . Lateral meniscal tear 01/25/2016  . Patellofemoral arthralgia of right knee 01/25/2016  . TIA (transient ischemic attack) 10/05/2015  . Polycythemia 10/05/2015  . Diplopia 10/05/2015  . Thyroid disease   . Celiac disease    Past Medical History:  Diagnosis Date  . Arthritis   . Asthma   . CAD in native artery 06/06/2017   Mild LAD disease on coronary CT-A 05/2017.  . Cancer  ([place])    skin  . Celiac disease   . Hyperlipidemia 05/11/2017  . Hypertension    pt denies at preop  . Hypothyroidism   . Polycythemia   . 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 Dellis Filbert)  . Thyroid disease   . TIA (transient ischemic attack)    2017    Past Surgical History:  Procedure Laterality Date  . ABDOMINAL HYSTERECTOMY    . BLADDER SURGERY     hole in bladder from hysterectomy  . BREAST SURGERY     biopsy breast benign  . EYE SURGERY     bil cataract  . TOTAL THYROIDECTOMY      Current Facility-Administered Medications  Medication Dose Route Frequency Provider Last Rate Last Dose  . [START ON 08/06/2019] bupivacaine liposome (EXPAREL) 1.3 % injection 133 mg  10 mL Other Once Melrose Nakayama, MD      . Derrill Memo ON 08/06/2019] tranexamic acid (CYKLOKAPRON) 2,000 mg in sodium chloride 0.9 % 50 mL Topical Application  8,366 mg Topical To OR Melrose Nakayama, MD       Current Outpatient Medications  Medication Sig Dispense Refill Last Dose  . aspirin EC 81 MG tablet Take 81 mg by mouth daily. Currently taking 2 asa qd per surgeon for upcoming surgery     . cholecalciferol (VITAMIN D) 1000 units tablet Take 1,000 Units by mouth daily.     . Cyanocobalamin (B-12 SL) Place 1 capsule under the tongue daily.     Marland Kitchen levothyroxine (SYNTHROID, LEVOTHROID) 125 MCG tablet Take 1 tablet (125 mcg total) by mouth every other day. (  Patient taking differently: Take 125 mcg by mouth every evening. ) 30 tablet 0   . Misc Natural Products (TART CHERRY ADVANCED) CAPS Take 1 capsule by mouth at bedtime.     . Multiple Vitamins-Minerals (MULTIVITAMIN WITH MINERALS) tablet Take 1 tablet by mouth daily.     . naproxen sodium (ALEVE) 220 MG tablet Take 440 mg by mouth 2 (two) times daily as needed (pain).     . rosuvastatin (CRESTOR) 5 MG tablet Take 1 tablet by mouth 3 times weekly (Patient taking differently: Take 5 mg by mouth every Monday, Wednesday, and Friday. At night)  36 tablet 0    Allergies  Allergen Reactions  . Propantheline Rash    Cannot remember other symptoms   . Atorvastatin     Myalgia   . Gluten Meal Diarrhea    bloating  . Sulfa Antibiotics Swelling  . Zetia [Ezetimibe]     Myalgias   . Demerol [Meperidine] Rash  . Morphine And Related Rash    Social History   Tobacco Use  . Smoking status: Never Smoker  . Smokeless tobacco: Never Used  Substance Use Topics  . Alcohol use: Yes    Comment: daily   3 or more     Family History  Problem Relation Age of Onset  . Peripheral Artery Disease Mother   . Dementia Father      Review of Systems  Musculoskeletal: Positive for joint pain.       Right hip   All other systems reviewed and are negative.   Objective:  Physical Exam  Constitutional: She is oriented to person, place, and time. She appears well-developed and well-nourished.  HENT:  Head: Normocephalic and atraumatic.  Eyes: Pupils are equal, round, and reactive to light.  Neck: Normal range of motion.  Cardiovascular: Normal rate and regular rhythm.  Respiratory: Effort normal.  GI: Soft.  Musculoskeletal:     Comments: Right hip motion is extremely painful and limited in internal rotation.  Opposite hip moves well.  Straight leg raise is negative.  She walks with a markedly altered gait.  Low back is notable for some pain on forward flexion.  She has no scars about her back.   Neurological: She is alert and oriented to person, place, and time.  Skin: Skin is warm and dry.  Psychiatric: She has a normal mood and affect. Her behavior is normal. Judgment and thought content normal.    Vital signs in last 24 hours: Temp:  [98.1 F (36.7 C)] 98.1 F (36.7 C) (11/30 0920) Pulse Rate:  [56] 56 (11/30 0920) Resp:  [16] 16 (11/30 0920) BP: (130)/(88) 130/88 (11/30 0920) SpO2:  [98 %] 98 % (11/30 0920) Weight:  [73.9 kg] 73.9 kg (11/30 0920)  Labs:   Estimated body mass index is 27.12 kg/m as calculated from the  following:   Height as of 08/05/19: 5' 5"  (1.651 m).   Weight as of 08/05/19: 73.9 kg.   Imaging Review Plain radiographs demonstrate severe degenerative joint disease of the right hip(s). The bone quality appears to be good for age and reported activity level.      Assessment/Plan:  End stage primary arthritis, right hip(s)  The patient history, physical examination, clinical judgement of the provider and imaging studies are consistent with end stage degenerative joint disease of the right hip(s) and total hip arthroplasty is deemed medically necessary. The treatment options including medical management, injection therapy, arthroscopy and arthroplasty were discussed at length. The risks and  benefits of total hip arthroplasty were presented and reviewed. The risks due to aseptic loosening, infection, stiffness, dislocation/subluxation,  thromboembolic complications and other imponderables were discussed.  The patient acknowledged the explanation, agreed to proceed with the plan and consent was signed. Patient is being admitted for inpatient treatment for surgery, pain control, PT, OT, prophylactic antibiotics, VTE prophylaxis, progressive ambulation and ADL's and discharge planning.The patient is planning to be discharged home with home health services

## 2019-08-05 NOTE — Progress Notes (Signed)
Anesthesia Chart Review   Case: [id number] Date/Time: 08/06/19 0951   Procedure: RIGHT TOTAL HIP ARTHROPLASTY ANTERIOR APPROACH (Right Hip)   Anesthesia type: Spinal   Pre-op diagnosis: RIGHT HIP DEGENERATIVE JOINT DISEASE   Location: [place] 06 / [facility]   Surgeon: Melrose Nakayama, MD      DISCUSSION:78 y.o. never smoker with h/o HLD, hypothyroidism, HTN, asthma, TIA 2017, right hip djd scheduled for above procedure 08/06/2019 with Dr. Melrose Nakayama.   Pt last seen by cardiology 07/31/2019 for preoperative evaluation.  Seen by Kerin Ransom, PA-C.  Per OV note, "OK for hip surgery from cardiology standpoint. I will fax official clearance via Epic. OK to f/u in a year with dr Oval Linsey."  Anticipate pt can proceed with planned procedure barring acute status change.   VS: BP 130/88   Pulse (!) 56   Temp 36.7 C (Oral)   Resp 16   Ht 5' 5"  (1.651 m)   Wt 73.9 kg   SpO2 98%   BMI 27.12 kg/m   PROVIDERS: Velna Hatchet, MD is PCP   Skeet Latch, MD is Cardiologist  LABS: Labs reviewed: Acceptable for surgery. (all labs ordered are listed, but only abnormal results are displayed)  Labs Reviewed  SURGICAL PCR SCREEN  APTT  BASIC METABOLIC PANEL  CBC WITH DIFFERENTIAL/PLATELET  PROTIME-INR  URINALYSIS, ROUTINE W REFLEX MICROSCOPIC  TYPE AND SCREEN  ABO/RH     IMAGES: Chest Xray 08/05/2019 FINDINGS: There is slight scarring in the left base. The lungs elsewhere are clear. Heart size and pulmonary vascularity are normal. No adenopathy. There is degenerative change in the thoracic spine with lower thoracic levoscoliosis.  IMPRESSION: Slight scarring left base. Lungs elsewhere clear. Cardiac silhouette normal. No adenopathy.  EKG: 08/05/2019 Rate 51 bpm Sinus bradycardia   CV: Echo 10/06/2015 Study Conclusions  - Left ventricle: The cavity size was normal. Wall thickness was   normal. Systolic function was normal. The estimated ejection   fraction was in the  range of 55% to 60%. Wall motion was normal;   there were no regional wall motion abnormalities. There was no   evidence of elevated ventricular filling pressure by Doppler   parameters. - Mitral valve: There was mild regurgitation. - Left atrium: The atrium was mildly dilated.  Impressions:  - No cardiac source of emboli was indentified. Past Medical History:  Diagnosis Date  . Arthritis   . Asthma   . CAD in native artery 06/06/2017   Mild LAD disease on coronary CT-A 05/2017.  . Cancer ([place])    skin  . Celiac disease   . Hyperlipidemia 05/11/2017  . Hypertension    pt denies at preop  . Hypothyroidism   . Polycythemia   . 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 Dellis Filbert)  . Thyroid disease   . TIA (transient ischemic attack)    2017    Past Surgical History:  Procedure Laterality Date  . ABDOMINAL HYSTERECTOMY    . BLADDER SURGERY     hole in bladder from hysterectomy  . BREAST SURGERY     biopsy breast benign  . EYE SURGERY     bil cataract  . TOTAL THYROIDECTOMY      MEDICATIONS: . aspirin EC 81 MG tablet  . cholecalciferol (VITAMIN D) 1000 units tablet  . Cyanocobalamin (B-12 SL)  . levothyroxine (SYNTHROID, LEVOTHROID) 125 MCG tablet  . Misc Natural Products (TART CHERRY ADVANCED) CAPS  . Multiple Vitamins-Minerals (MULTIVITAMIN WITH MINERALS) tablet  .  naproxen sodium (ALEVE) 220 MG tablet  . rosuvastatin (CRESTOR) 5 MG tablet   No current facility-administered medications for this encounter.    Derrill Memo ON 08/06/2019] bupivacaine liposome (EXPAREL) 1.3 % injection 133 mg  . [START ON 08/06/2019] tranexamic acid (CYKLOKAPRON) 2,000 mg in sodium chloride 0.9 % 50 mL Topical Application    Maia Plan [facility] Pre-Surgical Testing [phone] 08/05/19  12:46 PM

## 2019-08-05 NOTE — Anesthesia Preprocedure Evaluation (Addendum)
Anesthesia Evaluation  Patient identified by MRN, date of birth, ID band Patient awake    Reviewed: Allergy & Precautions, H&P , NPO status , Patient's Chart, lab work & pertinent test results  Airway Mallampati: II   Neck ROM: full    Dental   Pulmonary asthma ,    breath sounds clear to auscultation       Cardiovascular hypertension,  Rhythm:regular Rate:Normal     Neuro/Psych TIA   GI/Hepatic   Endo/Other  Hypothyroidism   Renal/GU      Musculoskeletal  (+) Arthritis ,   Abdominal   Peds  Hematology   Anesthesia Other Findings   Reproductive/Obstetrics                            Anesthesia Physical Anesthesia Plan  ASA: II  Anesthesia Plan: Spinal and MAC   Post-op Pain Management:    Induction: Intravenous  PONV Risk Score and Plan: 2 and Ondansetron, Dexamethasone, Propofol infusion and Treatment may vary due to age or medical condition  Airway Management Planned: Simple Face Mask  Additional Equipment:   Intra-op Plan:   Post-operative Plan:   Informed Consent: I have reviewed the patients History and Physical, chart, labs and discussed the procedure including the risks, benefits and alternatives for the proposed anesthesia with the patient or authorized representative who has indicated his/her understanding and acceptance.       Plan Discussed with: CRNA, Anesthesiologist and Surgeon  Anesthesia Plan Comments: (See PAT note 08/05/2019, Konrad Felix, PA-C)       Anesthesia Quick Evaluation

## 2019-08-05 NOTE — Progress Notes (Signed)
PCP - Velna Hatchet Cardiologist - Skeet Latch 07-31-19 Kerin Ransom PA-C clearance note in epic  Chest x-ray -  EKG - 08-05-19 epic Stress Test -  ECHO - 2017 epic Cardiac Cath -   Sleep Study -  CPAP -   Fasting Blood Sugar -  Checks Blood Sugar _____ times a day  Blood Thinner Instructions: Aspirin Instructions: (2) 81 mg QD Last Dose:  08-05-19  Anesthesia review: TIA 2017 , takes( 2) 81 mg asa per day . No instructions for stopping per pt. Dr. Latanya Maudlin  Told her to start taking 2 QD   Patient denies shortness of breath, fever, cough and chest pain at PAT appointment   NONE   Patient verbalized understanding of instructions that were given to them at the PAT appointment. Patient was also instructed that they will need to review over the PAT instructions again at home before surgery.

## 2019-08-06 ENCOUNTER — Encounter (HOSPITAL_COMMUNITY)
Admission: RE | Disposition: A | Discharge status: Home / Self Care | Payer: Self-pay | Source: Home / Self Care | Attending: Orthopaedic Surgery

## 2019-08-06 ENCOUNTER — Ambulatory Visit (HOSPITAL_COMMUNITY): Payer: Medicare Other

## 2019-08-06 ENCOUNTER — Encounter (HOSPITAL_COMMUNITY): Payer: Self-pay | Admitting: Registered Nurse

## 2019-08-06 ENCOUNTER — Observation Stay (HOSPITAL_COMMUNITY)
Admission: RE | Admit: 2019-08-06 | Discharge: 2019-08-07 | Disposition: A | Discharge status: Home / Self Care | Payer: Medicare Other | Attending: Orthopaedic Surgery | Admitting: Orthopaedic Surgery

## 2019-08-06 ENCOUNTER — Ambulatory Visit (HOSPITAL_COMMUNITY): Payer: Medicare Other | Admitting: Registered Nurse

## 2019-08-06 ENCOUNTER — Ambulatory Visit (HOSPITAL_COMMUNITY): Payer: Medicare Other | Admitting: Physician Assistant

## 2019-08-06 DIAGNOSIS — Z885 Allergy status to narcotic agent status: Secondary | ICD-10-CM | POA: Insufficient documentation

## 2019-08-06 DIAGNOSIS — Z882 Allergy status to sulfonamides status: Secondary | ICD-10-CM | POA: Insufficient documentation

## 2019-08-06 DIAGNOSIS — Z79890 Hormone replacement therapy (postmenopausal): Secondary | ICD-10-CM | POA: Diagnosis not present

## 2019-08-06 DIAGNOSIS — Z881 Allergy status to other antibiotic agents status: Secondary | ICD-10-CM | POA: Diagnosis not present

## 2019-08-06 DIAGNOSIS — Z79899 Other long term (current) drug therapy: Secondary | ICD-10-CM | POA: Insufficient documentation

## 2019-08-06 DIAGNOSIS — Z85828 Personal history of other malignant neoplasm of skin: Secondary | ICD-10-CM | POA: Insufficient documentation

## 2019-08-06 DIAGNOSIS — R262 Difficulty in walking, not elsewhere classified: Secondary | ICD-10-CM | POA: Diagnosis not present

## 2019-08-06 DIAGNOSIS — Z8673 Personal history of transient ischemic attack (TIA), and cerebral infarction without residual deficits: Secondary | ICD-10-CM | POA: Insufficient documentation

## 2019-08-06 DIAGNOSIS — Z7982 Long term (current) use of aspirin: Secondary | ICD-10-CM | POA: Insufficient documentation

## 2019-08-06 DIAGNOSIS — S72009A Fracture of unspecified part of neck of unspecified femur, initial encounter for closed fracture: Secondary | ICD-10-CM

## 2019-08-06 DIAGNOSIS — I2510 Atherosclerotic heart disease of native coronary artery without angina pectoris: Secondary | ICD-10-CM | POA: Diagnosis not present

## 2019-08-06 DIAGNOSIS — Z888 Allergy status to other drugs, medicaments and biological substances status: Secondary | ICD-10-CM | POA: Insufficient documentation

## 2019-08-06 DIAGNOSIS — E039 Hypothyroidism, unspecified: Secondary | ICD-10-CM | POA: Insufficient documentation

## 2019-08-06 DIAGNOSIS — Z8249 Family history of ischemic heart disease and other diseases of the circulatory system: Secondary | ICD-10-CM | POA: Insufficient documentation

## 2019-08-06 DIAGNOSIS — M1611 Unilateral primary osteoarthritis, right hip: Secondary | ICD-10-CM | POA: Diagnosis not present

## 2019-08-06 DIAGNOSIS — K900 Celiac disease: Secondary | ICD-10-CM | POA: Diagnosis not present

## 2019-08-06 HISTORY — PX: TOTAL HIP ARTHROPLASTY: SHX124

## 2019-08-06 LAB — TYPE AND SCREEN
ABO/RH(D): A POS
Antibody Screen: NEGATIVE

## 2019-08-06 SURGERY — ARTHROPLASTY, HIP, TOTAL, ANTERIOR APPROACH
Anesthesia: General | Site: Hip | Laterality: Right

## 2019-08-06 MED ORDER — ROCURONIUM BROMIDE 10 MG/ML (PF) SYRINGE
PREFILLED_SYRINGE | INTRAVENOUS | Status: AC
  Filled 2019-08-06: qty 10

## 2019-08-06 MED ORDER — LACTATED RINGERS IV SOLN
INTRAVENOUS | Status: DC
  Administered 2019-08-06 (×2): via INTRAVENOUS

## 2019-08-06 MED ORDER — TRANEXAMIC ACID-NACL 1000-0.7 MG/100ML-% IV SOLN
1000.0000 mg | Freq: Once | INTRAVENOUS | Status: AC
  Administered 2019-08-06: 1000 mg via INTRAVENOUS
  Filled 2019-08-06: qty 100

## 2019-08-06 MED ORDER — ONDANSETRON HCL 4 MG PO TABS: 4.0000 mg | ORAL_TABLET | Freq: Four times a day (QID) | ORAL | Status: DC | PRN

## 2019-08-06 MED ORDER — ASPIRIN 81 MG PO CHEW
81.0000 mg | CHEWABLE_TABLET | Freq: Two times a day (BID) | ORAL | Status: DC
  Administered 2019-08-07: 81 mg via ORAL
  Filled 2019-08-06: qty 1

## 2019-08-06 MED ORDER — PROPOFOL 10 MG/ML IV BOLUS
INTRAVENOUS | Status: DC | PRN
  Administered 2019-08-06: 130 mg via INTRAVENOUS
  Administered 2019-08-06: 30 mg via INTRAVENOUS

## 2019-08-06 MED ORDER — FENTANYL CITRATE (PF) 100 MCG/2ML IJ SOLN
INTRAMUSCULAR | Status: DC | PRN
  Administered 2019-08-06 (×4): 50 ug via INTRAVENOUS

## 2019-08-06 MED ORDER — METOCLOPRAMIDE HCL 5 MG PO TABS: 5.0000 mg | ORAL_TABLET | Freq: Three times a day (TID) | ORAL | Status: DC | PRN

## 2019-08-06 MED ORDER — FENTANYL CITRATE (PF) 100 MCG/2ML IJ SOLN
INTRAMUSCULAR | Status: AC
  Filled 2019-08-06: qty 2

## 2019-08-06 MED ORDER — OXYCODONE HCL 5 MG PO TABS: 5.0000 mg | ORAL_TABLET | Freq: Once | ORAL | Status: DC | PRN

## 2019-08-06 MED ORDER — DOCUSATE SODIUM 100 MG PO CAPS
100.0000 mg | ORAL_CAPSULE | Freq: Two times a day (BID) | ORAL | Status: DC
  Administered 2019-08-06 – 2019-08-07 (×2): 100 mg via ORAL
  Filled 2019-08-06 (×2): qty 1

## 2019-08-06 MED ORDER — ALUM & MAG HYDROXIDE-SIMETH 200-200-20 MG/5ML PO SUSP: 30.0000 mL | ORAL | Status: DC | PRN

## 2019-08-06 MED ORDER — POVIDONE-IODINE 10 % EX SWAB: 2.0000 "application " | Freq: Once | CUTANEOUS | Status: DC

## 2019-08-06 MED ORDER — BUPIVACAINE-EPINEPHRINE (PF) 0.25% -1:200000 IJ SOLN
INTRAMUSCULAR | Status: DC | PRN
  Administered 2019-08-06: 30 mL

## 2019-08-06 MED ORDER — HYDROCODONE-ACETAMINOPHEN 5-325 MG PO TABS
1.0000 | ORAL_TABLET | ORAL | Status: DC | PRN
  Administered 2019-08-06 – 2019-08-07 (×3): 1 via ORAL
  Filled 2019-08-06 (×2): qty 1
  Filled 2019-08-06: qty 2

## 2019-08-06 MED ORDER — ONDANSETRON HCL 4 MG/2ML IJ SOLN
INTRAMUSCULAR | Status: AC
  Filled 2019-08-06: qty 2

## 2019-08-06 MED ORDER — CEFAZOLIN SODIUM-DEXTROSE 2-4 GM/100ML-% IV SOLN
2.0000 g | Freq: Four times a day (QID) | INTRAVENOUS | Status: AC
  Administered 2019-08-06 (×2): 2 g via INTRAVENOUS
  Filled 2019-08-06 (×2): qty 100

## 2019-08-06 MED ORDER — SUGAMMADEX SODIUM 200 MG/2ML IV SOLN
INTRAVENOUS | Status: DC | PRN
  Administered 2019-08-06: 150 mg via INTRAVENOUS

## 2019-08-06 MED ORDER — LACTATED RINGERS IV SOLN
INTRAVENOUS | Status: DC
  Administered 2019-08-06 – 2019-08-07 (×2): via INTRAVENOUS

## 2019-08-06 MED ORDER — HYDROMORPHONE HCL 1 MG/ML IJ SOLN: 0.5000 mg | INTRAMUSCULAR | Status: DC | PRN

## 2019-08-06 MED ORDER — SUCCINYLCHOLINE CHLORIDE 200 MG/10ML IV SOSY
PREFILLED_SYRINGE | INTRAVENOUS | Status: AC
  Filled 2019-08-06: qty 10

## 2019-08-06 MED ORDER — MENTHOL 3 MG MT LOZG: 1.0000 | LOZENGE | OROMUCOSAL | Status: DC | PRN

## 2019-08-06 MED ORDER — METHOCARBAMOL 500 MG IVPB - SIMPLE MED
INTRAVENOUS | Status: AC
  Filled 2019-08-06: qty 50

## 2019-08-06 MED ORDER — LIDOCAINE 2% (20 MG/ML) 5 ML SYRINGE
INTRAMUSCULAR | Status: AC
  Filled 2019-08-06: qty 5

## 2019-08-06 MED ORDER — KETOROLAC TROMETHAMINE 15 MG/ML IJ SOLN
7.5000 mg | Freq: Four times a day (QID) | INTRAMUSCULAR | Status: DC
  Administered 2019-08-06 – 2019-08-07 (×3): 7.5 mg via INTRAVENOUS
  Filled 2019-08-06 (×3): qty 1

## 2019-08-06 MED ORDER — ONDANSETRON HCL 4 MG/2ML IJ SOLN
INTRAMUSCULAR | Status: DC | PRN
  Administered 2019-08-06: 4 mg via INTRAVENOUS

## 2019-08-06 MED ORDER — BUPIVACAINE-EPINEPHRINE 0.25% -1:200000 IJ SOLN
INTRAMUSCULAR | Status: AC
  Filled 2019-08-06: qty 1

## 2019-08-06 MED ORDER — DIPHENHYDRAMINE HCL 12.5 MG/5ML PO ELIX: 12.5000 mg | ORAL_SOLUTION | ORAL | Status: DC | PRN

## 2019-08-06 MED ORDER — PHENOL 1.4 % MT LIQD: 1.0000 | OROMUCOSAL | Status: DC | PRN

## 2019-08-06 MED ORDER — SUCCINYLCHOLINE CHLORIDE 20 MG/ML IJ SOLN
INTRAMUSCULAR | Status: DC | PRN
  Administered 2019-08-06: 100 mg via INTRAVENOUS

## 2019-08-06 MED ORDER — TRANEXAMIC ACID-NACL 1000-0.7 MG/100ML-% IV SOLN
1000.0000 mg | INTRAVENOUS | Status: AC
  Administered 2019-08-06: 1000 mg via INTRAVENOUS
  Filled 2019-08-06: qty 100

## 2019-08-06 MED ORDER — PROPOFOL 10 MG/ML IV BOLUS
INTRAVENOUS | Status: AC
  Filled 2019-08-06: qty 20

## 2019-08-06 MED ORDER — ACETAMINOPHEN 325 MG PO TABS: 325.0000 mg | ORAL_TABLET | Freq: Four times a day (QID) | ORAL | Status: DC | PRN

## 2019-08-06 MED ORDER — ONDANSETRON HCL 4 MG/2ML IJ SOLN: 4.0000 mg | Freq: Four times a day (QID) | INTRAMUSCULAR | Status: DC | PRN

## 2019-08-06 MED ORDER — OXYCODONE HCL 5 MG/5ML PO SOLN: 5.0000 mg | Freq: Once | ORAL | Status: DC | PRN

## 2019-08-06 MED ORDER — 0.9 % SODIUM CHLORIDE (POUR BTL) OPTIME
TOPICAL | Status: DC | PRN
  Administered 2019-08-06: 1000 mL

## 2019-08-06 MED ORDER — CHLORHEXIDINE GLUCONATE 4 % EX LIQD: 60.0000 mL | Freq: Once | CUTANEOUS | Status: DC

## 2019-08-06 MED ORDER — CEFAZOLIN SODIUM-DEXTROSE 2-4 GM/100ML-% IV SOLN
2.0000 g | INTRAVENOUS | Status: AC
  Administered 2019-08-06: 2 g via INTRAVENOUS
  Filled 2019-08-06: qty 100

## 2019-08-06 MED ORDER — METHOCARBAMOL 500 MG PO TABS
500.0000 mg | ORAL_TABLET | Freq: Four times a day (QID) | ORAL | Status: DC | PRN
  Administered 2019-08-06: 500 mg via ORAL
  Filled 2019-08-06: qty 1

## 2019-08-06 MED ORDER — ROSUVASTATIN CALCIUM 5 MG PO TABS
5.0000 mg | ORAL_TABLET | ORAL | Status: DC
  Administered 2019-08-07: 5 mg via ORAL
  Filled 2019-08-06: qty 1

## 2019-08-06 MED ORDER — HYDROMORPHONE HCL 1 MG/ML IJ SOLN
INTRAMUSCULAR | Status: AC
  Filled 2019-08-06: qty 1

## 2019-08-06 MED ORDER — HYDROMORPHONE HCL 1 MG/ML IJ SOLN
0.5000 mg | INTRAMUSCULAR | Status: DC | PRN
  Administered 2019-08-06 (×2): 0.5 mg via INTRAVENOUS

## 2019-08-06 MED ORDER — METHOCARBAMOL 500 MG IVPB - SIMPLE MED
500.0000 mg | Freq: Four times a day (QID) | INTRAVENOUS | Status: DC | PRN
  Administered 2019-08-06: 500 mg via INTRAVENOUS
  Filled 2019-08-06: qty 50

## 2019-08-06 MED ORDER — LEVOTHYROXINE SODIUM 125 MCG PO TABS
125.0000 ug | ORAL_TABLET | Freq: Every day | ORAL | Status: DC
  Administered 2019-08-07: 125 ug via ORAL
  Filled 2019-08-06: qty 1

## 2019-08-06 MED ORDER — DEXAMETHASONE SODIUM PHOSPHATE 10 MG/ML IJ SOLN
INTRAMUSCULAR | Status: DC | PRN
  Administered 2019-08-06: 10 mg via INTRAVENOUS

## 2019-08-06 MED ORDER — TRANEXAMIC ACID 1000 MG/10ML IV SOLN
INTRAVENOUS | Status: DC | PRN
  Administered 2019-08-06: 2000 mg via TOPICAL

## 2019-08-06 MED ORDER — FENTANYL CITRATE (PF) 100 MCG/2ML IJ SOLN
25.0000 ug | INTRAMUSCULAR | Status: DC | PRN
  Administered 2019-08-06 (×3): 50 ug via INTRAVENOUS

## 2019-08-06 MED ORDER — ACETAMINOPHEN 500 MG PO TABS
500.0000 mg | ORAL_TABLET | Freq: Four times a day (QID) | ORAL | Status: DC
  Administered 2019-08-06 – 2019-08-07 (×3): 500 mg via ORAL
  Filled 2019-08-06 (×3): qty 1

## 2019-08-06 MED ORDER — LIDOCAINE 2% (20 MG/ML) 5 ML SYRINGE
INTRAMUSCULAR | Status: DC | PRN
  Administered 2019-08-06: 40 mg via INTRAVENOUS

## 2019-08-06 MED ORDER — ROCURONIUM BROMIDE 100 MG/10ML IV SOLN
INTRAVENOUS | Status: DC | PRN
  Administered 2019-08-06: 5 mg via INTRAVENOUS
  Administered 2019-08-06: 40 mg via INTRAVENOUS

## 2019-08-06 MED ORDER — PROPOFOL 500 MG/50ML IV EMUL
INTRAVENOUS | Status: AC
  Filled 2019-08-06: qty 50

## 2019-08-06 MED ORDER — HYDROCODONE-ACETAMINOPHEN 7.5-325 MG PO TABS: 1.0000 | ORAL_TABLET | ORAL | Status: DC | PRN

## 2019-08-06 MED ORDER — EPHEDRINE 5 MG/ML INJ
INTRAVENOUS | Status: AC
  Filled 2019-08-06: qty 10

## 2019-08-06 MED ORDER — BISACODYL 5 MG PO TBEC: 5.0000 mg | DELAYED_RELEASE_TABLET | Freq: Every day | ORAL | Status: DC | PRN

## 2019-08-06 MED ORDER — METOCLOPRAMIDE HCL 5 MG/ML IJ SOLN: 5.0000 mg | Freq: Three times a day (TID) | INTRAMUSCULAR | Status: DC | PRN

## 2019-08-06 MED ORDER — BUPIVACAINE LIPOSOME 1.3 % IJ SUSP
INTRAMUSCULAR | Status: DC | PRN
  Administered 2019-08-06: 20 mL

## 2019-08-06 MED ORDER — DEXAMETHASONE SODIUM PHOSPHATE 10 MG/ML IJ SOLN
INTRAMUSCULAR | Status: AC
  Filled 2019-08-06: qty 1

## 2019-08-06 SURGICAL SUPPLY — 44 items
BAG DECANTER FOR FLEXI CONT (MISCELLANEOUS) ×2 IMPLANT
BLADE SAW SGTL 18X1.27X75 (BLADE) ×2 IMPLANT
BOOTIES KNEE HIGH SLOAN (MISCELLANEOUS) ×2 IMPLANT
CELLS DAT CNTRL 66122 CELL SVR (MISCELLANEOUS) ×1 IMPLANT
COVER PERINEAL POST (MISCELLANEOUS) ×2 IMPLANT
COVER SURGICAL LIGHT HANDLE (MISCELLANEOUS) ×2 IMPLANT
COVER WAND RF STERILE (DRAPES) IMPLANT
CUP GRIPTON 48MM 100 HIP (Hips) ×1 IMPLANT
DECANTER SPIKE VIAL GLASS SM (MISCELLANEOUS) ×2 IMPLANT
DRAPE IMP U-DRAPE 54X76 (DRAPES) ×2 IMPLANT
DRAPE STERI IOBAN 125X83 (DRAPES) ×2 IMPLANT
DRAPE U-SHAPE 47X51 STRL (DRAPES) ×4 IMPLANT
DRSG AQUACEL AG ADV 3.5X 6 (GAUZE/BANDAGES/DRESSINGS) ×2 IMPLANT
DURAPREP 26ML APPLICATOR (WOUND CARE) ×2 IMPLANT
ELECT BLADE TIP CTD 4 INCH (ELECTRODE) ×2 IMPLANT
ELECT REM PT RETURN 15FT ADLT (MISCELLANEOUS) ×2 IMPLANT
ELIMINATOR HOLE APEX DEPUY (Hips) ×1 IMPLANT
GLOVE BIO SURGEON STRL SZ8 (GLOVE) ×4 IMPLANT
GLOVE BIOGEL PI IND STRL 8 (GLOVE) ×2 IMPLANT
GLOVE BIOGEL PI INDICATOR 8 (GLOVE) ×2
GOWN STRL REUS W/TWL XL LVL3 (GOWN DISPOSABLE) ×4 IMPLANT
HEAD FEM STD 32X+1 STRL (Hips) ×1 IMPLANT
HOLDER FOLEY CATH W/STRAP (MISCELLANEOUS) ×2 IMPLANT
KIT TURNOVER KIT A (KITS) IMPLANT
LINER ACET 32X48 (Liner) ×1 IMPLANT
MANIFOLD NEPTUNE II (INSTRUMENTS) ×2 IMPLANT
NEEDLE HYPO 22GX1.5 SAFETY (NEEDLE) ×2 IMPLANT
NS IRRIG 1000ML POUR BTL (IV SOLUTION) ×2 IMPLANT
PACK ANTERIOR HIP CUSTOM (KITS) ×2 IMPLANT
PENCIL SMOKE EVACUATOR (MISCELLANEOUS) IMPLANT
PROTECTOR NERVE ULNAR (MISCELLANEOUS) ×2 IMPLANT
RETRACTOR WND ALEXIS 18 MED (MISCELLANEOUS) ×1 IMPLANT
RTRCTR WOUND ALEXIS 18CM MED (MISCELLANEOUS) ×2
STEM FEMORAL SZ 6MM STD ACTIS (Stem) ×1 IMPLANT
SUT ETHIBOND NAB CT1 #1 30IN (SUTURE) ×4 IMPLANT
SUT VIC AB 1 CT1 36 (SUTURE) ×2 IMPLANT
SUT VIC AB 2-0 CT1 27 (SUTURE) ×2
SUT VIC AB 2-0 CT1 TAPERPNT 27 (SUTURE) ×1 IMPLANT
SUT VIC AB 3-0 PS2 18 (SUTURE) ×2
SUT VIC AB 3-0 PS2 18XBRD (SUTURE) ×1 IMPLANT
SUT VLOC 180 0 24IN GS25 (SUTURE) ×2 IMPLANT
SYR 50ML LL SCALE MARK (SYRINGE) ×2 IMPLANT
TRAY FOLEY MTR SLVR 16FR STAT (SET/KITS/TRAYS/PACK) ×1 IMPLANT
YANKAUER SUCT BULB TIP 10FT TU (MISCELLANEOUS) ×2 IMPLANT

## 2019-08-06 NOTE — Plan of Care (Signed)
Plan of care 

## 2019-08-06 NOTE — Evaluation (Signed)
Physical Therapy Evaluation Patient Details Name: Julie [name] MRN: [id number] DOB: Feb 21, 1941 Today's Date: 08/06/2019   History of Present Illness  R DA-THA  Clinical Impression  Pt is s/p THA resulting in the deficits listed below (see PT Problem List). Pt ambulated 93' with RW, no loss of balance. Initiated THA HEP. Good progress expected.  Pt will benefit from skilled PT to increase their independence and safety with mobility to allow discharge to the venue listed below.      Follow Up Recommendations Follow surgeon's recommendation for DC plan and follow-up therapies    Equipment Recommendations  None recommended by PT    Recommendations for Other Services       Precautions / Restrictions Precautions Precautions: Fall Restrictions Weight Bearing Restrictions: No Other Position/Activity Restrictions: WBAT      Mobility  Bed Mobility Overal bed mobility: Needs Assistance Bed Mobility: Supine to Sit     Supine to sit: Min assist     General bed mobility comments: min A for RLE  Transfers Overall transfer level: Needs assistance Equipment used: Rolling walker (2 wheeled) Transfers: Sit to/from Stand Sit to Stand: Min assist         General transfer comment: VCs hand placement, min A to power up  Ambulation/Gait Ambulation/Gait assistance: Min guard Gait Distance (Feet): 50 Feet Assistive device: Rolling walker (2 wheeled) Gait Pattern/deviations: Step-through pattern;Decreased stride length Gait velocity: decr   General Gait Details: VCs to relax shoulders and for sequencing, no loss of balance  Stairs            Wheelchair Mobility    Modified Rankin (Stroke Patients Only)       Balance Overall balance assessment: Modified Independent                                           Pertinent Vitals/Pain Pain Assessment: 0-10 Pain Score: 6  Pain Location: R hip Pain Descriptors / Indicators: Sore Pain Intervention(s):  Limited activity within patient's tolerance;Monitored during session;Patient requesting pain meds-RN notified;Ice applied    Home Living Family/patient expects to be discharged to:: Private residence Living Arrangements: Spouse/significant other Available Help at Discharge: Available 24 hours/day Type of Home: House Home Access: Stairs to enter Entrance Stairs-Rails: None Entrance Stairs-Number of Steps: 1 Home Layout: Two level;Able to live on main level with bedroom/bathroom Home Equipment: Gilford Rile - 2 wheels;Toilet riser;Shower seat      Prior Function Level of Independence: Independent               Hand Dominance        Extremity/Trunk Assessment   Upper Extremity Assessment Upper Extremity Assessment: Overall WFL for tasks assessed    Lower Extremity Assessment Lower Extremity Assessment: RLE deficits/detail RLE Deficits / Details: hip +2/5, limited by pain RLE Sensation: WNL RLE Coordination: WNL    Cervical / Trunk Assessment Cervical / Trunk Assessment: Normal  Communication   Communication: No difficulties  Cognition Arousal/Alertness: Awake/alert Behavior During Therapy: WFL for tasks assessed/performed Overall Cognitive Status: Within Functional Limits for tasks assessed                                        General Comments      Exercises Total Joint Exercises Ankle Circles/Pumps: AROM;Both;5 reps;Supine Heel Slides: AAROM;Right;10  reps;Supine Hip ABduction/ADduction: AAROM;Right;10 reps;Supine   Assessment/Plan    PT Assessment Patient needs continued PT services  PT Problem List Decreased strength;Decreased activity tolerance;Decreased mobility;Pain;Decreased knowledge of use of DME       PT Treatment Interventions DME instruction;Gait training;Functional mobility training;Stair training;Therapeutic exercise;Therapeutic activities;Patient/family education    PT Goals (Current goals can be found in the Care Plan  section)  Acute Rehab PT Goals Patient Stated Goal: walk without pain PT Goal Formulation: With patient/family Time For Goal Achievement: 08/13/19 Potential to Achieve Goals: Good    Frequency 7X/week   Barriers to discharge        Co-evaluation               AM-PAC PT "6 Clicks" Mobility  Outcome Measure Help needed turning from your back to your side while in a flat bed without using bedrails?: A Little Help needed moving from lying on your back to sitting on the side of a flat bed without using bedrails?: A Little Help needed moving to and from a bed to a chair (including a wheelchair)?: A Little Help needed standing up from a chair using your arms (e.g., wheelchair or bedside chair)?: A Little Help needed to walk in hospital room?: A Little Help needed climbing 3-5 steps with a railing? : A Lot 6 Click Score: 17    End of Session Equipment Utilized During Treatment: Gait belt Activity Tolerance: Patient tolerated treatment well Patient left: in chair;with call bell/phone within reach;with family/visitor present Nurse Communication: Mobility status PT Visit Diagnosis: Difficulty in walking, not elsewhere classified (R26.2);Pain;Muscle weakness (generalized) (M62.81) Pain - Right/Left: Right Pain - part of body: Hip    Time: 4580-9983 PT Time Calculation (min) (ACUTE ONLY): 24 min   Charges:   PT Evaluation $PT Eval Low Complexity: 1 Low PT Treatments $Gait Training: 8-22 mins        Blondell Reveal Kistler PT 08/06/2019  Acute Rehabilitation Services Pager [phone] Office [phone]

## 2019-08-06 NOTE — Interval H&P Note (Signed)
History and Physical Interval Note:  08/06/2019 9:25 AM  Julie [name]  has presented today for surgery, with the diagnosis of RIGHT HIP DEGENERATIVE JOINT DISEASE.  The various methods of treatment have been discussed with the patient and family. After consideration of risks, benefits and other options for treatment, the patient has consented to  Procedure(s): RIGHT TOTAL HIP ARTHROPLASTY ANTERIOR APPROACH (Right) as a surgical intervention.  The patient's history has been reviewed, patient examined, no change in status, stable for surgery.  I have reviewed the patient's chart and labs.  Questions were answered to the patient's satisfaction.     Hessie Dibble

## 2019-08-06 NOTE — Op Note (Signed)
PRE-OP DIAGNOSIS:  RIGHT HIP DEGENERATIVE JOINT DISEASE POST-OP DIAGNOSIS:  same PROCEDURE: RIGHT TOTAL HIP ARTHROPLASTY ANTERIOR APPROACH ANESTHESIA:  General SURGEON:  Melrose Nakayama MD ASSISTANT:  Loni Dolly PA-C   INDICATIONS FOR PROCEDURE:  The patient is a 78 y.o. female with a long history of a painful hip.  This has persisted despite multiple conservative measures.  The patient has persisted with pain and dysfunction making rest and activity difficult.  A total hip replacement is offered as surgical treatment.  Informed operative consent was obtained after discussion of possible complications including reaction to anesthesia, infection, neurovascular injury, dislocation, DVT, PE, and death.  The importance of the postoperative rehab program to optimize result was stressed with the patient.  SUMMARY OF FINDINGS AND PROCEDURE:  Under the above anesthesia (spinal unsuccessful) through a anterior approach an the Hana table a right THR was performed.  The patient had severe degenerative change and good bone quality.  We used DePuy components to replace the hip and these were size 6 standard Actis femur capped with a +1 64m metal hip ball.  On the acetabular side we used a size 48 Gription shell with a  plus 0 neutral polyethylene liner.  We did use a hole eliminator.  ALoni DollyPA-C assisted throughout and was invaluable to the completion of the case in that he helped position and retract while I performed the procedure.  He also closed simultaneously to help minimize OR time.  I used fluoroscopy throughout the case to check position of implants and leg lengths and read all of these views myself.  DESCRIPTION OF PROCEDURE:  The patient was taken to the OR suite where the above anesthetic was applied.  The patient was then positioned on the Hana table supine.  All bony prominences were appropriately padded.  Prep and drape was then performed in normal sterile fashion.  The patient was given kefzol  preoperative antibiotic and an appropriate time out was performed.  We then took an anterior approach to the right hip.  Dissection was taken through adipose to the tensor fascia lata fascia.  This structure was incised longitudinally and we dissected in the intermuscular interval just medial to this muscle.  Cobra retractors were placed superior and inferior to the femoral neck superficial to the capsule.  A capsular incision was then made and the retractors were placed along the femoral neck.  Xray was brought in to get a good level for the femoral neck cut which was made with an oscillating saw and osteotome.  The femoral head was removed with a corkscrew.  The acetabulum was exposed and some labral tissues were excised. Reaming was taken to the inside wall of the pelvis and sequentially up to 1 mm smaller than the actual component.  A trial of components was done and then the aforementioned acetabular shell was placed in appropriate tilt and anteversion confirmed by fluoroscopy. The liner was placed along with the hole eliminator and attention was turned to the femur.  The leg was brought down and over into adduction and the elevator bar was used to raise the femur up gently in the wound.  The piriformis was released with care taken to preserve the obturator internus attachment and all of the posterior capsule. The femur was reamed and then broached to the appropriate size.  A trial reduction was done and the aforementioned head and neck assembly gave u[place]oreathe best stability in extension with external rotation.  Leg lengths were felt to be about  equal by fluoroscopic exam.  The trial components were removed and the wound irrigated.  We then placed the femoral component in appropriate anteversion.  The head was applied to a dry stem neck and the hip again reduced.  It was again stable in the aforementioned position.  The would was irrigated again followed by re-approximation of anterior capsule with ethibond  suture. Tensor fascia was repaired with V-loc suture  followed by deep closure with #O and #2 undyed vicryl.  Skin was closed with subQ stitch and steristrips followed by a sterile dressing.  EBL and IOF can be obtained from anesthesia records.  DISPOSITION:  The patient was extubated in the OR and taken to PACU in stable condition to be admitted to the Orthopedic Surgery for appropriate post-op care to include perioperative antibiotics and DVT prophylaxis.

## 2019-08-06 NOTE — Transfer of Care (Signed)
Immediate Anesthesia Transfer of Care Note  Patient: Julie [name]  Procedure(s) Performed: RIGHT TOTAL HIP ARTHROPLASTY ANTERIOR APPROACH (Right Hip)  Patient Location: PACU  Anesthesia Type:General  Level of Consciousness: sedated  Airway & Oxygen Therapy: Patient Spontanous Breathing and Patient connected to face mask oxygen  Post-op Assessment: Report given to RN and Post -op Vital signs reviewed and stable  Post vital signs: Reviewed and stable  Last Vitals:  Vitals Value Taken Time  BP    Temp    Pulse 61 08/06/19 1220  Resp 18 08/06/19 1220  SpO2 93 % 08/06/19 1220  Vitals shown include unvalidated device data.  Last Pain:  Vitals:   08/06/19 0824  TempSrc:   PainSc: 0-No pain         Complications: No apparent anesthesia complications

## 2019-08-06 NOTE — Anesthesia Procedure Notes (Signed)
Procedure Name: Intubation Date/Time: 08/06/2019 10:49 AM Performed by: Talbot Grumbling, CRNA Pre-anesthesia Checklist: Patient identified, Emergency Drugs available, Suction available and Patient being monitored Patient Re-evaluated:Patient Re-evaluated prior to induction Oxygen Delivery Method: Circle system utilized Preoxygenation: Pre-oxygenation with 100% oxygen Induction Type: IV induction Ventilation: Mask ventilation without difficulty Laryngoscope Size: Mac and 3 Grade View: Grade I Tube type: Oral Tube size: 7.0 mm Number of attempts: 1 Airway Equipment and Method: Stylet Placement Confirmation: ETT inserted through vocal cords under direct vision,  positive ETCO2 and breath sounds checked- equal and bilateral Secured at: 21 cm Tube secured with: Tape Dental Injury: Teeth and Oropharynx as per pre-operative assessment

## 2019-08-07 ENCOUNTER — Encounter (HOSPITAL_COMMUNITY): Payer: Self-pay | Admitting: Orthopaedic Surgery

## 2019-08-07 DIAGNOSIS — M1611 Unilateral primary osteoarthritis, right hip: Secondary | ICD-10-CM | POA: Diagnosis not present

## 2019-08-07 MED ORDER — TIZANIDINE HCL 4 MG PO TABS: 4.0000 mg | ORAL_TABLET | Freq: Four times a day (QID) | ORAL | 1 refills | Status: AC | PRN

## 2019-08-07 MED ORDER — HYDROCODONE-ACETAMINOPHEN 5-325 MG PO TABS: 1.0000 | ORAL_TABLET | Freq: Four times a day (QID) | ORAL | 0 refills | Status: DC | PRN

## 2019-08-07 MED ORDER — ASPIRIN 81 MG PO CHEW: 81.0000 mg | CHEWABLE_TABLET | Freq: Two times a day (BID) | ORAL | 0 refills | Status: DC

## 2019-08-07 NOTE — Discharge Summary (Signed)
Patient ID: Julie [name] MRN: [id number] DOB/AGE: 1940/12/02 78 y.o.  Admit date: 08/06/2019 Discharge date: 08/07/2019  Admission Diagnoses:  Principal Problem:   Primary localized osteoarthritis of right hip Active Problems:   Primary osteoarthritis of right hip   Discharge Diagnoses:  Same  Past Medical History:  Diagnosis Date  . Arthritis   . Asthma   . CAD in native artery 06/06/2017   Mild LAD disease on coronary CT-A 05/2017.  . Cancer ([place])    skin  . Celiac disease   . Hyperlipidemia 05/11/2017  . Hypertension    pt denies at preop  . Hypothyroidism   . Polycythemia   . 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 Dellis Filbert)  . Thyroid disease   . TIA (transient ischemic attack)    2017    Surgeries: Procedure(s): RIGHT TOTAL HIP ARTHROPLASTY ANTERIOR APPROACH on 08/06/2019   Consultants:   Discharged Condition: Improved  Hospital Course: TANEE HENERY is an 78 y.o. female who was admitted 08/06/2019 for operative treatment ofPrimary localized osteoarthritis of right hip. Patient has severe unremitting pain that affects sleep, daily activities, and work/hobbies. After pre-op clearance the patient was taken to the operating room on 08/06/2019 and underwent  Procedure(s): RIGHT TOTAL HIP ARTHROPLASTY ANTERIOR APPROACH.    Patient was given perioperative antibiotics:  Anti-infectives (From admission, onward)   Start     Dose/Rate Route Frequency Ordered Stop   08/06/19 1630  ceFAZolin (ANCEF) IVPB 2g/100 mL premix     2 g 200 mL/hr over 30 Minutes Intravenous Every 6 hours 08/06/19 1516 08/06/19 2223   08/06/19 0815  ceFAZolin (ANCEF) IVPB 2g/100 mL premix     2 g 200 mL/hr over 30 Minutes Intravenous On call to O.R. 08/06/19 0802 08/06/19 1043       Patient was given sequential compression devices, early ambulation, and chemoprophylaxis to prevent DVT.  Patient benefited maximally from hospital stay and there were no complications.     Recent vital signs:  Patient Vitals for the past 24 hrs:  BP Temp Temp src Pulse Resp SpO2 Height Weight  08/07/19 0511 133/61 97.8 F (36.6 C) Oral (!) 51 16 98 % - -  08/07/19 0105 (!) 115/57 (!) 97.5 F (36.4 C) Oral (!) 51 16 99 % - -  08/06/19 2045 120/77 98.2 F (36.8 C) Oral 62 16 95 % - -  08/06/19 2015 - - - 60 16 - - -  08/06/19 1727 128/71 97.7 F (36.5 C) Oral (!) 57 15 98 % - -  08/06/19 1602 136/79 (!) 97.5 F (36.4 C) Oral 60 14 94 % - -  08/06/19 1513 135/65 98.3 F (36.8 C) Oral (!) 57 18 100 % 5' 5"  (1.651 m) 73.5 kg  08/06/19 1445 - 98 F (36.7 C) - - - - - -  08/06/19 1345 (!) 141/69 98.4 F (36.9 C) - (!) 55 11 100 % - -  08/06/19 1330 - - - (!) 54 12 100 % - -  08/06/19 1315 (!) 120/49 - - 60 19 98 % - -  08/06/19 1300 125/74 - - (!) 57 16 100 % - -  08/06/19 1245 135/79 - - (!) 50 16 100 % - -  08/06/19 1230 132/87 - - (!) 56 12 100 % - -  08/06/19 1219 135/63 98 F (36.7 C) - 60 14 93 % - -  08/06/19 4888 - - - - - - 5' 5"  (1.651  m) 73.9 kg     Recent laboratory studies:  Recent Labs    08/05/19 0953  WBC 4.5  HGB 14.9  HCT 45.4  PLT 249  NA 140  K 4.8  CL 107  CO2 25  BUN 20  CREATININE 0.78  GLUCOSE 90  INR 1.0  CALCIUM 8.9     Discharge Medications:   Allergies as of 08/07/2019      Reactions   Propantheline Rash   Cannot remember other symptoms   Atorvastatin    Myalgia    Gluten Meal Diarrhea   bloating   Sulfa Antibiotics Swelling   Zetia [ezetimibe]    Myalgias    Demerol [meperidine] Rash   Morphine And Related Rash      Medication List    STOP taking these medications   aspirin EC 81 MG tablet Replaced by: aspirin 81 MG chewable tablet   naproxen sodium 220 MG tablet Commonly known as: ALEVE     TAKE these medications   aspirin 81 MG chewable tablet Chew 1 tablet (81 mg total) by mouth 2 (two) times daily. Replaces: aspirin EC 81 MG tablet   B-12 SL Place 1 capsule under the tongue daily.    cholecalciferol 1000 units tablet Commonly known as: VITAMIN D Take 1,000 Units by mouth daily.   HYDROcodone-acetaminophen 5-325 MG tablet Commonly known as: NORCO/VICODIN Take 1-2 tablets by mouth every 6 (six) hours as needed for moderate pain (pain score 4-6).   levothyroxine 125 MCG tablet Commonly known as: SYNTHROID Take 1 tablet (125 mcg total) by mouth every other day. What changed: when to take this   multivitamin with minerals tablet Take 1 tablet by mouth daily.   rosuvastatin 5 MG tablet Commonly known as: CRESTOR Take 1 tablet by mouth 3 times weekly What changed:   how much to take  how to take this  when to take this  additional instructions   Tart Cherry Advanced Caps Take 1 capsule by mouth at bedtime.   tiZANidine 4 MG tablet Commonly known as: Zanaflex Take 1 tablet (4 mg total) by mouth every 6 (six) hours as needed.            Durable Medical Equipment  (From admission, onward)         Start     Ordered   08/06/19 1515  DME Walker rolling  Once    Question:  Patient needs a walker to treat with the following condition  Answer:  Primary osteoarthritis of right hip   08/06/19 1516   08/06/19 1515  DME 3 n 1  Once     08/06/19 1516   08/06/19 1515  DME Bedside commode  Once    Question:  Patient needs a bedside commode to treat with the following condition  Answer:  Primary osteoarthritis of right hip   08/06/19 1516          Diagnostic Studies: Dg Chest 2 View  Result Date: 08/05/2019 CLINICAL DATA:  Assessment for total hip replacement. History of asthma EXAM: CHEST - 2 VIEW COMPARISON:  October 05, 2015 FINDINGS: There is slight scarring in the left base. The lungs elsewhere are clear. Heart size and pulmonary vascularity are normal. No adenopathy. There is degenerative change in the thoracic spine with lower thoracic levoscoliosis. IMPRESSION: Slight scarring left base. Lungs elsewhere clear. Cardiac silhouette normal. No  adenopathy. Electronically Signed   By: Lowella Grip III M.D.   On: 08/05/2019 11:03   Dg C-arm  1-60 Min-no Report  Result Date: 08/06/2019 Fluoroscopy was utilized by the requesting physician.  No radiographic interpretation.   Dg Hip Operative Unilat W Or W/o Pelvis Right  Result Date: 08/06/2019 CLINICAL DATA:  Operative imaging provided for right hip arthroplasty. EXAM: OPERATIVE RIGHT HIP (WITH PELVIS IF PERFORMED) for VIEWS TECHNIQUE: Fluoroscopic spot image(s) were submitted for interpretation post-operatively. COMPARISON:  06/27/2019 FINDINGS: Images show placement of a total right hip arthroplasty. The femoral and acetabular components appear well seated and aligned. No evidence of an operative complication. IMPRESSION: Well-positioned total right hip arthroplasty. Electronically Signed   By: Lajean Manes M.D.   On: 08/06/2019 12:29    Disposition: Discharge disposition: 01-Home or Self Care       Discharge Instructions    Call MD / Call 911   Complete by: As directed    If you experience chest pain or shortness of breath, CALL 911 and be transported to the hospital emergency room.  If you develope a fever above 101 F, pus (white drainage) or increased drainage or redness at the wound, or calf pain, call your surgeon's office.   Constipation Prevention   Complete by: As directed    Drink plenty of fluids.  Prune juice may be helpful.  You may use a stool softener, such as Colace (over the counter) 100 mg twice a day.  Use MiraLax (over the counter) for constipation as needed.   Diet - low sodium heart healthy   Complete by: As directed    Discharge instructions   Complete by: As directed    INSTRUCTIONS AFTER JOINT REPLACEMENT   Remove items at home which could result in a fall. This includes throw rugs or furniture in walking pathways ICE to the affected joint every three hours while awake for 30 minutes at a time, for at least the first 3-5 days, and then as needed  for pain and swelling.  Continue to use ice for pain and swelling. You may notice swelling that will progress down to the foot and ankle.  This is normal after surgery.  Elevate your leg when you are not up walking on it.   Continue to use the breathing machine you got in the hospital (incentive spirometer) which will help keep your temperature down.  It is common for your temperature to cycle up and down following surgery, especially at night when you are not up moving around and exerting yourself.  The breathing machine keeps your lungs expanded and your temperature down.   DIET:  As you were doing prior to hospitalization, we recommend a well-balanced diet.  DRESSING / WOUND CARE / SHOWERING  You may shower 3 days after surgery, but keep the wounds dry during showering.  You may use an occlusive plastic wrap (Press'n Seal for example), NO SOAKING/SUBMERGING IN THE BATHTUB.  If the bandage gets wet, change with a clean dry gauze.  If the incision gets wet, pat the wound dry with a clean towel.  ACTIVITY  Increase activity slowly as tolerated, but follow the weight bearing instructions below.   No driving for 6 weeks or until further direction given by your physician.  You cannot drive while taking narcotics.  No lifting or carrying greater than 10 lbs. until further directed by your surgeon. Avoid periods of inactivity such as sitting longer than an hour when not asleep. This helps prevent blood clots.  You may return to work once you are authorized by your doctor.     WEIGHT BEARING  Weight bearing as tolerated with assist device (walker, cane, etc) as directed, use it as long as suggested by your surgeon or therapist, typically at least 4-6 weeks.   EXERCISES  Results after joint replacement surgery are often greatly improved when you follow the exercise, range of motion and muscle strengthening exercises prescribed by your doctor. Safety measures are also important to protect the  joint from further injury. Any time any of these exercises cause you to have increased pain or swelling, decrease what you are doing until you are comfortable again and then slowly increase them. If you have problems or questions, call your caregiver or physical therapist for advice.   Rehabilitation is important following a joint replacement. After just a few days of immobilization, the muscles of the leg can become weakened and shrink (atrophy).  These exercises are designed to build up the tone and strength of the thigh and leg muscles and to improve motion. Often times heat used for twenty to thirty minutes before working out will loosen up your tissues and help with improving the range of motion but do not use heat for the first two weeks following surgery (sometimes heat can increase post-operative swelling).   These exercises can be done on a training (exercise) mat, on the floor, on a table or on a bed. Use whatever works the best and is most comfortable for you.    Use music or television while you are exercising so that the exercises are a pleasant break in your day. This will make your life better with the exercises acting as a break in your routine that you can look forward to.   Perform all exercises about fifteen times, three times per day or as directed.  You should exercise both the operative leg and the other leg as well.   Exercises include:   Quad Sets - Tighten up the muscle on the front of the thigh (Quad) and hold for 5-10 seconds.   Straight Leg Raises - With your knee straight (if you were given a brace, keep it on), lift the leg to 60 degrees, hold for 3 seconds, and slowly lower the leg.  Perform this exercise against resistance later as your leg gets stronger.  Leg Slides: Lying on your back, slowly slide your foot toward your buttocks, bending your knee up off the floor (only go as far as is comfortable). Then slowly slide your foot back down until your leg is flat on the floor  again.  Angel Wings: Lying on your back spread your legs to the side as far apart as you can without causing discomfort.  Hamstring Strength:  Lying on your back, push your heel against the floor with your leg straight by tightening up the muscles of your buttocks.  Repeat, but this time bend your knee to a comfortable angle, and push your heel against the floor.  You may put a pillow under the heel to make it more comfortable if necessary.   A rehabilitation program following joint replacement surgery can speed recovery and prevent re-injury in the future due to weakened muscles. Contact your doctor or a physical therapist for more information on knee rehabilitation.    CONSTIPATION  Constipation is defined medically as fewer than three stools per week and severe constipation as less than one stool per week.  Even if you have a regular bowel pattern at home, your normal regimen is likely to be disrupted due to multiple reasons following surgery.  Combination of anesthesia,  postoperative narcotics, change in appetite and fluid intake all can affect your bowels.   YOU MUST use at least one of the following options; they are listed in order of increasing strength to get the job done.  They are all available over the counter, and you may need to use some, POSSIBLY even all of these options:    Drink plenty of fluids (prune juice may be helpful) and high fiber foods Colace 100 mg by mouth twice a day  Senokot for constipation as directed and as needed Dulcolax (bisacodyl), take with full glass of water  Miralax (polyethylene glycol) once or twice a day as needed.  If you have tried all these things and are unable to have a bowel movement in the first 3-4 days after surgery call either your surgeon or your primary doctor.    If you experience loose stools or diarrhea, hold the medications until you stool forms back up.  If your symptoms do not get better within 1 week or if they get worse, check with  your doctor.  If you experience "the worst abdominal pain ever" or develop nausea or vomiting, please contact the office immediately for further recommendations for treatment.   ITCHING:  If you experience itching with your medications, try taking only a single pain pill, or even half a pain pill at a time.  You can also use Benadryl over the counter for itching or also to help with sleep.   TED HOSE STOCKINGS:  Use stockings on both legs until for at least 2 weeks or as directed by physician office. They may be removed at night for sleeping.  MEDICATIONS:  See your medication summary on the "After Visit Summary" that nursing will review with you.  You may have some home medications which will be placed on hold until you complete the course of blood thinner medication.  It is important for you to complete the blood thinner medication as prescribed.  PRECAUTIONS:  If you experience chest pain or shortness of breath - call 911 immediately for transfer to the hospital emergency department.   If you develop a fever greater that 101 F, purulent drainage from wound, increased redness or drainage from wound, foul odor from the wound/dressing, or calf pain - CONTACT YOUR SURGEON.                                                   FOLLOW-UP APPOINTMENTS:  If you do not already have a post-op appointment, please call the office for an appointment to be seen by your surgeon.  Guidelines for how soon to be seen are listed in your "After Visit Summary", but are typically between 1-4 weeks after surgery.  OTHER INSTRUCTIONS:   Knee Replacement:  Do not place pillow under knee, focus on keeping the knee straight while resting. CPM instructions: 0-90 degrees, 2 hours in the morning, 2 hours in the afternoon, and 2 hours in the evening. Place foam block, curve side up under heel at all times except when in CPM or when walking.  DO NOT modify, tear, cut, or change the foam block in any way.  MAKE SURE YOU:   Understand these instructions.  Get help right away if you are not doing well or get worse.    Thank you for letting us be a part of your medical  care team.  It is a privilege we respect greatly.  We hope these instructions will help you stay on track for a fast and full recovery!   Increase activity slowly as tolerated   Complete by: As directed       Follow-up Information    Melrose Nakayama, MD. Schedule an appointment as soon as possible for a visit in 2 weeks.   Specialty: Orthopedic Surgery Contact information: [place] [address] [phone]            Signed: Larwance Sachs Lisaann Atha 08/07/2019, 8:10 AM

## 2019-08-07 NOTE — Progress Notes (Signed)
Patient discharged to home w/ family. Given all belongings, instructions. Patient and wife verbalized understanding of instructions. Escorted to pov via w/c.

## 2019-08-07 NOTE — Plan of Care (Signed)
Plan of care reviewed and discussed with the patient. 

## 2019-08-07 NOTE — TOC Transition Note (Signed)
Transition of Care (TOC) - CM/SW Discharge Note   Patient Details  Name: Annahi A Bodie MRN: [id number] Date of Birth: 03/06/1941  Transition of Care (TOC) CM/SW Contact:  Nicole A Sinclair, LCSW Phone Number: 08/07/2019, 12:35 PM   Clinical Narrative:    CSW met with patient and significant other at bedside to discuss Home Health PT options. CSW gave patient a list of Medicare. Gov agencies. Patient chose her top three. [facility], [facility] and [facility]. [facility], and [facility] unable to accept due to staffing issues.  [facility] accepted. Patient given the contact information for [facility].   Therapy Plan: [facility] Patient reports she has a RW-Wide at home. Patient declined standard walker 3x.   Final next level of care: Home w Home Health Services Barriers to Discharge: No Barriers Identified   Patient Goals and CMS Choice   CMS Medicare.gov Compare Post Acute Care list provided to:: Patient Choice offered to / list presented to : Patient  Discharge Placement  Home                      Discharge Plan and Services                          HH Arranged: PT HH Agency: [facility] Date HH Agency Contacted: 08/07/19 Time HH Agency Contacted: 1115 Representative spoke with at HH Agency: Corey  Social Determinants of Health (SDOH) Interventions     Readmission Risk Interventions No flowsheet data found.     

## 2019-08-07 NOTE — Progress Notes (Signed)
Subjective: 1 Day Post-Op Procedure(s) (LRB): RIGHT TOTAL HIP ARTHROPLASTY ANTERIOR APPROACH (Right)   Patient doing well and looking forward to going home.   Activity level:  wbat Diet tolerance:  ok Voiding:  ok Patient reports pain as mild.    Objective: Vital signs in last 24 hours: Temp:  [97.5 F (36.4 C)-98.4 F (36.9 C)] 97.8 F (36.6 C) (12/02 0511) Pulse Rate:  [50-62] 51 (12/02 0511) Resp:  [11-19] 16 (12/02 0511) BP: (115-154)/(49-87) 133/61 (12/02 0511) SpO2:  [93 %-100 %] 98 % (12/02 0511) Weight:  [73.5 kg-73.9 kg] 73.5 kg (12/01 1513)  Labs: Recent Labs    08/05/19 0953  HGB 14.9   Recent Labs    08/05/19 0953  WBC 4.5  RBC 4.60  HCT 45.4  PLT 249   Recent Labs    08/05/19 0953  NA 140  K 4.8  CL 107  CO2 25  BUN 20  CREATININE 0.78  GLUCOSE 90  CALCIUM 8.9   Recent Labs    08/05/19 0953  INR 1.0    Physical Exam:  Neurologically intact ABD soft Neurovascular intact Sensation intact distally Intact pulses distally Dorsiflexion/Plantar flexion intact Incision: dressing C/D/I and no drainage No cellulitis present Compartment soft  Assessment/Plan:  1 Day Post-Op Procedure(s) (LRB): RIGHT TOTAL HIP ARTHROPLASTY ANTERIOR APPROACH (Right) Advance diet Up with therapy D/C IV fluids Discharge home with home health today after PT. Continue on ASA 70m BID x 4 weeks post op. Follow up in office 2 weeks post op.  ALarwance SachsNida 08/07/2019, 8:05 AM

## 2019-08-07 NOTE — Anesthesia Postprocedure Evaluation (Addendum)
Anesthesia Post Note  Patient: Julie [name]  Procedure(s) Performed: RIGHT TOTAL HIP ARTHROPLASTY ANTERIOR APPROACH (Right Hip)     Patient location during evaluation: PACU Anesthesia Type: General Level of consciousness: awake and alert Pain management: pain level controlled Vital Signs Assessment: post-procedure vital signs reviewed and stable Respiratory status: spontaneous breathing, nonlabored ventilation, respiratory function stable and patient connected to nasal cannula oxygen Cardiovascular status: blood pressure returned to baseline and stable Postop Assessment: no apparent nausea or vomiting Anesthetic complications: no    Last Vitals:  Vitals:   08/07/19 0105 08/07/19 0511  BP: (!) 115/57 133/61  Pulse: (!) 51 (!) 51  Resp: 16 16  Temp: (!) 36.4 C 36.6 C  SpO2: 99% 98%    Last Pain:  Vitals:   08/07/19 0511  TempSrc: Oral  PainSc:                  [place]

## 2019-08-07 NOTE — Progress Notes (Signed)
Physical Therapy Treatment Patient Details Name: Julie [name] MRN: [id number] DOB: 04/11/1941 Today's Date: 08/07/2019    History of Present Illness R DA-THA    PT Comments    Pt is progressing well with mobility and is ready to DC home from PT standpoint. She ambulated 250' with RW, completed stair training, and demonstrates good understanding of HEP.   Follow Up Recommendations  Follow surgeon's recommendation for DC plan and follow-up therapies     Equipment Recommendations  None recommended by PT    Recommendations for Other Services       Precautions / Restrictions Precautions Precautions: Fall Restrictions Weight Bearing Restrictions: No Other Position/Activity Restrictions: WBAT    Mobility  Bed Mobility Overal bed mobility: Modified Independent Bed Mobility: Supine to Sit     Supine to sit: Modified independent (Device/Increase time);HOB elevated        Transfers Overall transfer level: Needs assistance Equipment used: Rolling walker (2 wheeled) Transfers: Sit to/from Stand Sit to Stand: Min guard         General transfer comment: VCs hand placement  Ambulation/Gait Ambulation/Gait assistance: Supervision Gait Distance (Feet): 250 Feet Assistive device: Rolling walker (2 wheeled) Gait Pattern/deviations: Step-through pattern;Decreased stride length Gait velocity: decr   General Gait Details: VCs to relax shoulders and lift head, no loss of balance   Stairs Stairs: Yes Stairs assistance: Min assist Stair Management: No rails;Forwards Number of Stairs: 1 General stair comments: VCs sequencing, CG Arbie Cookey present for stair training, Min A to manage RW   Wheelchair Mobility    Modified Rankin (Stroke Patients Only)       Balance                                            Cognition Arousal/Alertness: Awake/alert Behavior During Therapy: WFL for tasks assessed/performed Overall Cognitive Status: Within Functional  Limits for tasks assessed                                        Exercises Total Joint Exercises Ankle Circles/Pumps: AROM;Both;5 reps;Supine Quad Sets: AROM;10 reps;Supine;Both Short Arc Quad: AROM;Right;10 reps;Supine Heel Slides: AAROM;Right;10 reps;Supine Hip ABduction/ADduction: AAROM;Right;10 reps;Supine(also x 5 standing) Long Arc Quad: AROM;Right;10 reps;Seated Knee Flexion: AROM;Right;5 reps;Standing Marching in Standing: AROM;Right;5 reps;Standing Standing Hip Extension: AROM;Right;5 reps;Standing    General Comments        Pertinent Vitals/Pain Pain Score: 3  Pain Location: R hip Pain Descriptors / Indicators: Sore Pain Intervention(s): Limited activity within patient's tolerance;Monitored during session;Premedicated before session;Ice applied    Home Living                      Prior Function            PT Goals (current goals can now be found in the care plan section) Acute Rehab PT Goals Patient Stated Goal: walk without pain PT Goal Formulation: With patient/family Time For Goal Achievement: 08/13/19 Potential to Achieve Goals: Good Progress towards PT goals: Progressing toward goals    Frequency    7X/week      PT Plan Current plan remains appropriate    Co-evaluation              AM-PAC PT "6 Clicks" Mobility   Outcome Measure  Help needed  turning from your back to your side while in a flat bed without using bedrails?: A Little Help needed moving from lying on your back to sitting on the side of a flat bed without using bedrails?: A Little Help needed moving to and from a bed to a chair (including a wheelchair)?: None Help needed standing up from a chair using your arms (e.g., wheelchair or bedside chair)?: None Help needed to walk in hospital room?: None Help needed climbing 3-5 steps with a railing? : A Little 6 Click Score: 21    End of Session Equipment Utilized During Treatment: Gait belt Activity  Tolerance: Patient tolerated treatment well Patient left: in chair;with call bell/phone within reach;with family/visitor present Nurse Communication: Mobility status PT Visit Diagnosis: Difficulty in walking, not elsewhere classified (R26.2);Pain;Muscle weakness (generalized) (M62.81) Pain - Right/Left: Right Pain - part of body: Hip     Time: 7078-6754 PT Time Calculation (min) (ACUTE ONLY): 26 min  Charges:  $Gait Training: 8-22 mins $Therapeutic Exercise: 8-22 mins                    Blondell Reveal Kistler PT 08/07/2019  Acute Rehabilitation Services Pager [phone] Office [phone]

## 2019-09-05 ENCOUNTER — Encounter: Payer: Self-pay | Admitting: Family Medicine

## 2019-09-10 ENCOUNTER — Ambulatory Visit: Payer: Medicare Other | Attending: Internal Medicine

## 2019-09-10 DIAGNOSIS — Z20822 Contact with and (suspected) exposure to covid-19: Secondary | ICD-10-CM

## 2019-09-12 LAB — NOVEL CORONAVIRUS, NAA: SARS-CoV-2, NAA: NOT DETECTED

## 2020-03-17 ENCOUNTER — Ambulatory Visit (INDEPENDENT_AMBULATORY_CARE_PROVIDER_SITE_OTHER): Payer: Medicare Other | Admitting: Family Medicine

## 2020-03-17 ENCOUNTER — Other Ambulatory Visit: Payer: Self-pay

## 2020-03-17 ENCOUNTER — Encounter: Payer: Self-pay | Admitting: Family Medicine

## 2020-03-17 DIAGNOSIS — M25561 Pain in right knee: Secondary | ICD-10-CM

## 2020-03-17 NOTE — Assessment & Plan Note (Addendum)
Injection given.  Encourage patient to continue to work with the Tru pull lite.  This is a chronic problem with exacerbation.  Do feel like this can continue to give her trouble.  Could be a candidate for potential viscosupplementation.  Stressed icing regimen and home exercise, increase activity slowly.  Follow-up again in 4 to 8 weeks.  Patient does have Zanaflex and help with nighttime pain

## 2020-03-17 NOTE — Progress Notes (Signed)
[facility] [address] [place] Phone: [phone] Subjective:   I Julie [name] am serving as a Education administrator for Dr. Hulan Saas.  This visit occurred during the SARS-CoV-2 public health emergency.  Safety protocols were in place, including screening questions prior to the visit, additional usage of staff PPE, and extensive cleaning of exam room while observing appropriate contact time as indicated for disinfecting solutions.   I'm seeing this patient by the request  of:  Velna Hatchet, MD  CC: Right knee pain  OQH:[id number]   01/10/2019 Lateral meniscal tear.  Does have some instability.  I do think that there is also concern for patient having a LCL tear.  Encouraged bracing.  Patient does not want further work-up at this time such as an MRI.  Update 03/17/2020 Julie [name] is a 79 y.o. female coming in with complaint of right knee pain. Patient states the knee is unstable sometimes. Wearing Don Joy TPL brace. Right hip replacement. Has had injection to get rid of fluid. Patient thinks she might fall if she doesn't figure out what is going on.   Onset- Chronic  Location - knee cap      Past Medical History:  Diagnosis Date  . Arthritis   . Asthma   . CAD in native artery 06/06/2017   Mild LAD disease on coronary CT-A 05/2017.  . Cancer ([facility])    skin  . Celiac disease   . Hyperlipidemia 05/11/2017  . Hypertension    pt denies at preop  . Hypothyroidism   . Polycythemia   . 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 Dellis Filbert)  . Thyroid disease   . TIA (transient ischemic attack)    2017   Past Surgical History:  Procedure Laterality Date  . ABDOMINAL HYSTERECTOMY    . BLADDER SURGERY     hole in bladder from hysterectomy  . BREAST SURGERY     biopsy breast benign  . EYE SURGERY     bil cataract  . 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 TOTAL THYROIDECTOMY     Social History   Socioeconomic History  . Marital status: Single    Spouse name: Not on file  . Number of children: Not on file  . Years of education: Not on file  . Highest education level: Not on file  Occupational History  . Not on file  Tobacco Use  . Smoking status: Never Smoker  . Smokeless tobacco: Never Used  Vaping Use  . Vaping Use: Never used  Substance and Sexual Activity  . Alcohol use: Yes    Comment: daily   3 or more   . Drug use: No  . Sexual activity: Not Currently  Other Topics Concern  . Not on file  Social History Narrative  . Not on file   Social Determinants of Health   Financial Resource Strain:   . Difficulty of Paying Living Expenses:   Food Insecurity:   . Worried About Charity fundraiser in the Last Year:   . Arboriculturist in the Last Year:   Transportation Needs:   . Film/video editor (Medical):   Marland Kitchen Lack of Transportation (Non-Medical):   Physical Activity:   . Days of Exercise per Week:   . Minutes of  Exercise per Session:   Stress:   . Feeling of Stress :   Social Connections:   . Frequency of Communication with Friends and Family:   . Frequency of Social Gatherings with Friends and Family:   . Attends Religious Services:   . Active Member of Clubs or Organizations:   . Attends Archivist Meetings:   Marland Kitchen Marital Status:    Allergies  Allergen Reactions  . Propantheline Rash    Cannot remember other symptoms   . Atorvastatin     Myalgia   . Gluten Meal Diarrhea    bloating  . Sulfa Antibiotics Swelling  . Zetia [Ezetimibe]     Myalgias   . Demerol [Meperidine] Rash  . Morphine And Related Rash   Family History  Problem Relation Age of Onset  . Peripheral Artery Disease Mother   . Dementia Father     Current Outpatient Medications (Endocrine & Metabolic):  .  levothyroxine  (SYNTHROID, LEVOTHROID) 125 MCG tablet, Take 1 tablet (125 mcg total) by mouth every other day. (Patient taking differently: Take 125 mcg by mouth every evening. )  Current Outpatient Medications (Cardiovascular):  .  rosuvastatin (CRESTOR) 5 MG tablet, Take 1 tablet by mouth 3 times weekly (Patient taking differently: Take 5 mg by mouth every Monday, Wednesday, and Friday. At night)   Current Outpatient Medications (Analgesics):  .  aspirin 81 MG chewable tablet, Chew 1 tablet (81 mg total) by mouth 2 (two) times daily. Marland Kitchen  HYDROcodone-acetaminophen (NORCO/VICODIN) 5-325 MG tablet, Take 1-2 tablets by mouth every 6 (six) hours as needed for moderate pain (pain score 4-6).  Current Outpatient Medications (Hematological):  Marland Kitchen  Cyanocobalamin (B-12 SL), Place 1 capsule under the tongue daily.  Current Outpatient Medications (Other):  .  cholecalciferol (VITAMIN D) 1000 units tablet, Take 1,000 Units by mouth daily. .  Misc Natural Products (TART CHERRY ADVANCED) CAPS, Take 1 capsule by mouth at bedtime. .  Multiple Vitamins-Minerals (MULTIVITAMIN WITH MINERALS) tablet, Take 1 tablet by mouth daily. Marland Kitchen  tiZANidine (ZANAFLEX) 4 MG tablet, Take 1 tablet (4 mg total) by mouth every 6 (six) hours as needed.   Reviewed prior external information including notes and imaging from  primary care provider As well as notes that were available from care everywhere and other healthcare systems.  Past medical history, social, surgical and family history all reviewed in electronic medical record.  No pertanent information unless stated regarding to the chief complaint.   Review of Systems:  No headache, visual changes, nausea, vomiting, diarrhea, constipation, dizziness, abdominal pain, skin rash, fevers, chills, night sweats, weight loss, swollen lymph nodes, body aches, joint swelling, chest pain, shortness of breath, mood changes. POSITIVE muscle aches  Objective  Blood pressure 106/80, pulse 72, height  5' 5"  (1.651 m), weight 162 lb (73.5 kg), SpO2 98 %.   General: No apparent distress alert and oriented x3 mood and affect normal, dressed appropriately.  HEENT: Pupils equal, extraocular movements intact  Respiratory: Patient's speak in full sentences and does not appear short of breath  Cardiovascular: Trace lower extremity edema, non tender, no erythema  Neuro: Cranial nerves II through XII are intact, neurovascularly intact in all extremities with 2+ DTRs and 2+ pulses.  Gait antalgic recent right hip replacement noted MSK: Right knee does have positive patellar grind.  Does have arthritic changes noted throughout.  No significant instability.  Lacks last 5 degrees.  Effusion noted.  After informed written and verbal consent, patient was seated on exam  table. Right knee was prepped with alcohol swab and utilizing anterolateral approach, patient's right knee space was injected with 4:1  marcaine 0.5%: Kenalog 43m/dL. Patient tolerated the procedure well without immediate complications. Impression and Recommendations:     The above documentation has been reviewed and is accurate and complete ZLyndal Pulley DO       Note: This dictation was prepared with Dragon dictation along with smaller phrase technology. Any transcriptional errors that result from this process are unintentional.

## 2020-03-17 NOTE — Patient Instructions (Signed)
Continue brace and exercise See me again in 6 weeks

## 2020-04-27 ENCOUNTER — Other Ambulatory Visit: Payer: Self-pay

## 2020-04-27 ENCOUNTER — Encounter: Payer: Self-pay | Admitting: Family Medicine

## 2020-04-27 ENCOUNTER — Ambulatory Visit (INDEPENDENT_AMBULATORY_CARE_PROVIDER_SITE_OTHER): Payer: Medicare Other | Admitting: Family Medicine

## 2020-04-27 DIAGNOSIS — M25561 Pain in right knee: Secondary | ICD-10-CM

## 2020-04-27 DIAGNOSIS — M170 Bilateral primary osteoarthritis of knee: Secondary | ICD-10-CM | POA: Diagnosis not present

## 2020-04-27 NOTE — Progress Notes (Signed)
[facility] [place] Phone: [phone] Subjective:   Julie [name], am serving as a scribe for Dr. Hulan Saas. This visit occurred during the SARS-CoV-2 public health emergency.  Safety protocols were in place, including screening questions prior to the visit, additional usage of staff PPE, and extensive cleaning of exam room while observing appropriate contact time as indicated for disinfecting solutions.   I'm seeing this patient by the request  of:  Velna Hatchet, MD  CC: Right knee pain follow-up  JAS:[id number]   03/17/2020 Injection given.  Encourage patient to continue to work with the Tru pull lite.  This is a chronic problem with exacerbation.  Do feel like this can continue to give her trouble.  Could be a candidate for potential viscosupplementation.  Stressed icing regimen and home exercise, increase activity slowly.  Follow-up again in 4 to 8 weeks.  Patient does have Zanaflex and help with nighttime pain  Update 04/27/2020 Julie [name] is a 79 y.o. female coming in with complaint of right knee pain. Pain has increased and feels unstable at night. Has a hard time moving from seated position.  Patient states that it is significant.  Symptoms increasing instability of the knee noted.  States that the swelling may not be significantly better.  Having right foot tibialis anterior pain that started today. Noticed pain when walking the dog. Has a scratch on skin. Notes swelling in that area.       Past Medical History:  Diagnosis Date   Arthritis    Asthma    CAD in native artery 06/06/2017   Mild LAD disease on coronary CT-A 05/2017.   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   Procedure: RIGHT TOTAL HIP ARTHROPLASTY ANTERIOR APPROACH;  Surgeon: Melrose Nakayama, MD;  Location: [facility];  Service: Orthopedics;  Laterality: Right;   TOTAL THYROIDECTOMY     Social History   Socioeconomic History   Marital status: Single    Spouse name: Not on file   Number of children: Not on file   Years of education: Not on file   Highest education level: Not on file  Occupational History   Not on file  Tobacco Use   Smoking status: Never Smoker   Smokeless tobacco: Never Used  Vaping Use   Vaping Use: Never used  Substance and Sexual Activity   Alcohol use: Yes    Comment: daily   3 or more    Drug use: [name]   Sexual activity: Not Currently  Other Topics Concern   Not on file  Social History Narrative   Not on file   Social Determinants of Health   Financial Resource Strain:    Difficulty of Paying Living Expenses: Not on file  Food Insecurity:    Worried About Running  Out of Food in the Last Year: Not on file   Ran Out of Food in the Last Year: Not on file  Transportation Needs:    Lack of Transportation (Medical): Not on file   Lack of Transportation (Non-Medical): Not on file  Physical Activity:    Days of Exercise per Week: Not on file   Minutes of Exercise per Session: Not on file  Stress:    Feeling of Stress : Not on file  Social Connections:    Frequency of Communication with Friends and Family: Not on file   Frequency of Social Gatherings with Friends and Family: Not on file   Attends Religious Services: Not on file   Active Member of [employer] or Organizations: Not on file   Attends Archivist Meetings: Not on file   Marital Status: Not on  file   Allergies  Allergen Reactions   Propantheline Rash    Cannot remember other symptoms    Atorvastatin     Myalgia    Gluten Meal Diarrhea    bloating   Sulfa Antibiotics Swelling   Zetia [Ezetimibe]     Myalgias    Demerol [Meperidine] Rash   Morphine And Related Rash   Family History  Problem Relation Age of Onset   Peripheral Artery Disease Mother    Dementia Father     Current Outpatient Medications (Endocrine & Metabolic):    levothyroxine (SYNTHROID, LEVOTHROID) 125 MCG tablet, Take 1 tablet (125 mcg total) by mouth every other day. (Patient taking differently: Take 125 mcg by mouth every evening. )  Current Outpatient Medications (Cardiovascular):    rosuvastatin (CRESTOR) 5 MG tablet, Take 1 tablet by mouth 3 times weekly (Patient taking differently: Take 5 mg by mouth every Monday, Wednesday, and Friday. At night)   Current Outpatient Medications (Analgesics):    aspirin 81 MG chewable tablet, Chew 1 tablet (81 mg total) by mouth 2 (two) times daily.   HYDROcodone-acetaminophen (NORCO/VICODIN) 5-325 MG tablet, Take 1-2 tablets by mouth every 6 (six) hours as needed for moderate pain (pain score 4-6).  Current Outpatient Medications (Hematological):    Cyanocobalamin (B-12 SL), Place 1 capsule under the tongue daily.  Current Outpatient Medications (Other):    cholecalciferol (VITAMIN D) 1000 units tablet, Take 1,000 Units by mouth daily.   Misc Natural Products (TART CHERRY ADVANCED) CAPS, Take 1 capsule by mouth at bedtime.   Multiple Vitamins-Minerals (MULTIVITAMIN WITH MINERALS) tablet, Take 1 tablet by mouth daily.   tiZANidine (ZANAFLEX) 4 MG tablet, Take 1 tablet (4 mg total) by mouth every 6 (six) hours as needed.   Reviewed prior external information including notes and imaging from  primary care provider As well as notes that were available from care everywhere and other healthcare systems.  Past medical history, social,  surgical and family history all reviewed in electronic medical record.  [name] pertanent information unless stated regarding to the chief complaint.   Review of Systems:  [name] headache, visual changes, nausea, vomiting, diarrhea, constipation, dizziness, abdominal pain, skin rash, fevers, chills, night sweats, weight loss, swollen lymph nodes, body aches, joint swelling, chest pain, shortness of breath, mood changes. POSITIVE muscle aches  Objective  There were [name] vitals taken for this visit.   General: [name] apparent distress alert and oriented x3 mood and affect normal, dressed appropriately.  HEENT: Pupils equal, extraocular movements intact  Respiratory: Patient's speak in full sentences and does not appear short of breath  Cardiovascular: [name] lower extremity edema, non tender,  [name] erythema  Neuro: Cranial nerves II through XII are intact, neurovascularly intact in all extremities with 2+ DTRs and 2+ pulses.  Gait mild antalgic MSK:   Knee: Right valgus deformity noted.  Abnormal thigh to calf ratio.  Tender to palpation over medial and PF joint line.  ROM decreased with 95 degrees of flexion and only lacks last 5 degrees of extension instability with valgus force.  painful patellar compression. Patellar glide with moderate crepitus. Patellar and quadriceps tendons unremarkable. Hamstring and quadriceps strength is normal. Contralateral knee shows mild arthritic changes as well   Impression and Recommendations:     The above documentation has been reviewed and is accurate and complete Lyndal Pulley, DO       Note: This dictation was prepared with Dragon dictation along with smaller phrase technology. Any transcriptional errors that result from this process are unintentional.

## 2020-04-27 NOTE — Patient Instructions (Addendum)
We will get you approved for gel OA stability right knee We can always get MRI if you want We will call you when gel is approved

## 2020-04-27 NOTE — Assessment & Plan Note (Signed)
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  We discussed with patient at great length about the possibility of different treatment options.  Patient is having some locking and giving out and we discussed the potential for an MRI to further evaluate the meniscal tear I will as the amount of arthritis and see if she would be a minimally invasive candidate.  Patient declined the MRI at this point but due to the instability and abnormal thigh to calf ratio we will give patient an only stability brace with a giving out on her.  In addition to this we talked about viscosupplementation and we will see if we can get approval for this and the arthritic changes.  If no improvement I encouraged patient that we would need the MRI for further evaluation.  Patient is in agreement.  Total time with patient discussing different treatment options today as well as reviewing patient's chart on the date of exam greater than 32 minutes.

## 2020-05-05 ENCOUNTER — Encounter: Payer: Self-pay | Admitting: Family Medicine

## 2020-05-05 ENCOUNTER — Ambulatory Visit: Payer: Medicare Other | Admitting: Family Medicine

## 2020-05-11 DIAGNOSIS — M170 Bilateral primary osteoarthritis of knee: Secondary | ICD-10-CM | POA: Insufficient documentation

## 2020-05-11 NOTE — Assessment & Plan Note (Signed)
Bilateral right greater then left, instability with abnormal calf to thigh ratio bilaterally, instability of PF joint as well. OA stability with tru pull light.

## 2020-09-02 ENCOUNTER — Other Ambulatory Visit: Payer: Self-pay | Admitting: Internal Medicine

## 2020-09-02 DIAGNOSIS — R7989 Other specified abnormal findings of blood chemistry: Secondary | ICD-10-CM

## 2020-09-11 ENCOUNTER — Other Ambulatory Visit: Payer: Self-pay | Admitting: Internal Medicine

## 2020-09-11 DIAGNOSIS — R7989 Other specified abnormal findings of blood chemistry: Secondary | ICD-10-CM

## 2020-09-15 ENCOUNTER — Other Ambulatory Visit: Payer: Self-pay

## 2020-09-15 ENCOUNTER — Encounter: Payer: Self-pay | Admitting: Cardiovascular Disease

## 2020-09-15 ENCOUNTER — Ambulatory Visit (INDEPENDENT_AMBULATORY_CARE_PROVIDER_SITE_OTHER): Payer: Medicare Other | Admitting: Cardiovascular Disease

## 2020-09-15 VITALS — BP 134/74 | HR 60 | Ht 66.0 in | Wt 154.0 lb

## 2020-09-15 DIAGNOSIS — I2510 Atherosclerotic heart disease of native coronary artery without angina pectoris: Secondary | ICD-10-CM

## 2020-09-15 DIAGNOSIS — G459 Transient cerebral ischemic attack, unspecified: Secondary | ICD-10-CM

## 2020-09-15 DIAGNOSIS — R7401 Elevation of levels of liver transaminase levels: Secondary | ICD-10-CM

## 2020-09-15 DIAGNOSIS — G720 Drug-induced myopathy: Secondary | ICD-10-CM

## 2020-09-15 DIAGNOSIS — T466X5A Adverse effect of antihyperlipidemic and antiarteriosclerotic drugs, initial encounter: Secondary | ICD-10-CM | POA: Insufficient documentation

## 2020-09-15 DIAGNOSIS — E7800 Pure hypercholesterolemia, unspecified: Secondary | ICD-10-CM | POA: Diagnosis not present

## 2020-09-15 HISTORY — DX: Pure hypercholesterolemia, unspecified: E78.00

## 2020-09-15 HISTORY — DX: Drug-induced myopathy: G72.0

## 2020-09-15 HISTORY — DX: Elevation of levels of liver transaminase levels: R74.01

## 2020-09-15 NOTE — Progress Notes (Signed)
Cardiology Office Note   Date:  09/15/2020   ID:  Julie [name], DOB Dec 19, 1940, MRN [id number]  PCP:  Velna Hatchet, MD  Cardiologist:   Skeet Latch, MD   No chief complaint on file.    History of Present Illness: Julie [name] is a 80 y.o. female with hypothyroidism, mild COPD, TIA, hyperlipidemia, Celiac disease, and skin cancer who presents for follow up.  She was initially seen 05/2017 for the evaluation of an abnormal EKG. Ms. Conlee saw her PCP 04/11/17 and was noted to have T-wave inversions in the anterolateral leads. She had previously stopped her statin due to concern of memory loss. She was feeling generally well but had an episode of chest pain 03/2017 that occurred in the setting of a stressful event.  She was referred for a coronary CT-A that showed mild plaque in the proximal LAD and D1.  There was no obstructive disease.  Ms. Hoadley atorvastatin was increased due to LDL not at goal.  There was significant improvement in her LDL, but she developed myalgia.  Atorvastatin was reduced back to the previous dose and Zetia was added.  She continues to struggle with myalgias.  She took rosuvastatin but her LFTs have been elevated.  She discontinued rosuvastatin and reduce her alcohol intake by half.  However LFTs continue to increase.  She is now not drinking any alcohol.  Prior to making this cut back she was drinking about 5 drinks daily. She is being followed by gastroenterology and going for an ultrasound on 1/25.  She continues to be active and walks her dog daily.  She also does water aerobics.  She has no exertional symptoms.  She denies any lower extremity edema, orthopnea, or PND.  She doesn't check her BP often.  When she does it is usually 110-120/60s-70s. Her significant other got COVID and is doing well but did develop atrial fibrillation.   Past Medical History:  Diagnosis Date  . Arthritis   . Asthma   . CAD in native artery 06/06/2017   Mild LAD disease on coronary  CT-A 05/2017.  . Cancer ([place])    skin  . Celiac disease   . Hyperlipidemia 05/11/2017  . Hypertension    pt denies at preop  . Hypothyroidism   . Polycythemia   . Pure hypercholesterolemia 09/15/2020  . 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 Dellis Filbert)  . Statin myopathy 09/15/2020  . Thyroid disease   . TIA (transient ischemic attack)    2017  . Transaminitis 09/15/2020    Past Surgical History:  Procedure Laterality Date  . ABDOMINAL HYSTERECTOMY    . BLADDER SURGERY     hole in bladder from hysterectomy  . BREAST SURGERY     biopsy breast benign  . EYE SURGERY     bil cataract  . TOTAL HIP ARTHROPLASTY Right 08/06/2019   Procedure: RIGHT TOTAL HIP ARTHROPLASTY ANTERIOR APPROACH;  Surgeon: Melrose Nakayama, MD;  Location: [facility];  Service: Orthopedics;  Laterality: Right;  . TOTAL THYROIDECTOMY       Current Outpatient Medications  Medication Sig Dispense Refill  . alendronate (FOSAMAX) 70 MG tablet     . amoxicillin (AMOXIL) 500 MG tablet Take 1,000 mg by mouth 2 (two) times daily.    Marland Kitchen Apoaequorin (PREVAGEN EXTRA STRENGTH PO) Take by mouth. 1 capsule Daily    . Ascorbic Acid (VITAMIN C) 1000 MG tablet Take 1,000 mg by mouth daily. 1 Tablet Daily    .  cholecalciferol (VITAMIN D) 1000 units tablet Take 1,000 Units by mouth daily.    . famotidine (PEPCID) 40 MG tablet Take by mouth.    Marland Kitchen ketoconazole (NIZORAL) 2 % shampoo     . levothyroxine (SYNTHROID, LEVOTHROID) 125 MCG tablet Take 1 tablet (125 mcg total) by mouth every other day. (Patient taking differently: Take 125 mcg by mouth every evening.) 30 tablet 0  . Misc Natural Products (TART CHERRY ADVANCED) CAPS Take 1 capsule by mouth at bedtime.    . Multiple Vitamins-Minerals (MULTIVITAMIN WITH MINERALS) tablet Take 1 tablet by mouth daily.     No current facility-administered medications for this visit.    Allergies:   Propantheline, Atorvastatin, Gluten meal, Sulfa antibiotics, Zetia  [ezetimibe], Demerol [meperidine], and Morphine and related    Social History:  The patient  reports that she has never smoked. She has never used smokeless tobacco. She reports current alcohol use. She reports that she does not use drugs.   Family History:  The patient's family history includes Dementia in her father; Peripheral Artery Disease in her mother.    ROS:  Please see the history of present illness.  Otherwise, review of systems are positive for R knee pain/swelling.   All other systems are reviewed and negative.    PHYSICAL EXAM: VS:  BP 134/74   Pulse 60   Ht 5' 6"  (1.676 m)   Wt 154 lb (69.9 kg)   SpO2 98%   BMI 24.86 kg/m  , BMI Body mass index is 24.86 kg/m. GENERAL:  Well appearing HEENT: Pupils equal round and reactive, fundi not visualized, oral mucosa unremarkable NECK:  No jugular venous distention, waveform within normal limits, carotid upstroke brisk and symmetric, no bruits, no thyromegaly LUNGS:  Clear to auscultation bilaterally HEART:  RRR.  PMI not displaced or sustained,S1 and S2 within normal limits, no S3, no S4, no clicks, no rubs, no murmurs ABD:  Flat, positive bowel sounds normal in frequency in pitch, no bruits, no rebound, no guarding, no midline pulsatile mass, no hepatomegaly, no splenomegaly EXT:  2 plus pulses throughout, no edema, no cyanosis no clubbing SKIN:  No rashes no nodules NEURO:  Cranial nerves II through XII grossly intact, motor grossly intact throughout PSYCH:  Cognitively intact, oriented to person place and time  EKG:  EKG is ordered today. The ekg ordered today demonstrates Sinus bradycardia. Rate 56 bpm. The axis deviation. 04/11/17: Sinus bradycardia. Rate 57 bpm. Left axis deviation. Anterior T-wave inversions cannot rule out prior septal infarct 10/05/15: Sinus bradycardia. Rate 52 bpm. Cannot rule out prior septal infarct 09/15/20: Sinus rhytm. Rate 60 bpm.  Coronary CT-A 05/29/17:  Coronary Arteries: Right dominant with  no anomalies  LM:  No plaque or stenosis. LAD system: Mixed plaque without significant stenosis ostial LAD. There was an area of soft plaque in the proximal LAD with mild stenosis. Small to moderate D1 with mixed plaque, no significant stenosis proximally. Moderate D2 without significant disease. Circumflex system: Small ramus, no significant disease. No plaque or stenosis in the LCx system. RCA system:  No plaque or stenosis.  Recent Labs: No results found for requested labs within last 8760 hours.    Lipid Panel    Component Value Date/Time   CHOL 220 (H) 02/07/2018 0823   TRIG 132 02/07/2018 0823   HDL 52 02/07/2018 0823   CHOLHDL 4.2 02/07/2018 0823   CHOLHDL 4.8 10/05/2015 1349   VLDL 44 (H) 10/05/2015 1349   LDLCALC 142 (H) 02/07/2018 6578  04/03/17:  Total cholesterol 235, triglycerides 141, HDL 49, LDL 158 TSH 0.82, free T4 1 41 sodium 141, potassium 4.6, BUN 17, creatinine 0.8 AST 15, ALT 50 WBC 4.82, hemoglobin 18.1, hematocrit 45.8, platelets 260    Wt Readings from Last 3 Encounters:  09/15/20 154 lb (69.9 kg)  04/27/20 152 lb (68.9 kg)  03/17/20 162 lb (73.5 kg)      ASSESSMENT AND PLAN:  # Non-obstructive CAD: # TIA: # Hyperlipidemia: Ms. Gosse hasn't tolerated higher doses of statins in the past due to myalgias. She has tried several, most recently atorvastatin and rosuvastatin.  She also has some memory loss which may be associated with the statin. Currently she is being worked up for elevated LFTs.  She had lipids checked with her PCP recently off all statins.  We will get a copy of this.  Consider Repatha or Praluent.  However we will wait for her GI work-up before adding anything else.  Continue exercising and watching her diet for now.  #Transaminitis: Continued abstention from alcohol.  Holding all hepatotoxic medications.  Further work-up per GI.    Current medicines are reviewed at length with the patient today.  The patient does not have  concerns regarding medicines.  The following changes have been made: stop atorvastatin and ezetimibe.  Labs/ tests ordered today include:   Orders Placed This Encounter  Procedures  . EKG 12-Lead     Disposition:   FU with Ashleigh Luckow C. Oval Linsey, MD, [facility] in 1 year.  I feel it   This note was written with the assistance of speech recognition software.  Please excuse any transcriptional errors.  Signed, Dilia Alemany C. Oval Linsey, MD, [facility]  09/15/2020 6:04 PM    [place]

## 2020-09-15 NOTE — Patient Instructions (Signed)
Medication Instructions:  Your physician recommends that you continue on your current medications as directed. Please refer to the Current Medication list given to you today.   *If you need a refill on your cardiac medications before your next appointment, please call your pharmacy*  Lab Work: NONE   Testing/Procedures: NONE  Follow-Up: At [facility], you and your health needs are our priority.  As part of our continuing mission to provide you with exceptional heart care, we have created designated Provider Care Teams.  These Care Teams include your primary Cardiologist (physician) and Advanced Practice Providers (APPs -  Physician Assistants and Nurse Practitioners) who all work together to provide you with the care you need, when you need it.  We recommend signing up for the patient portal called "MyChart".  Sign up information is provided on this After Visit Summary.  MyChart is used to connect with patients for Virtual Visits (Telemedicine).  Patients are able to view lab/test results, encounter notes, upcoming appointments, etc.  Non-urgent messages can be sent to your provider as well.   To learn more about what you can do with MyChart, go to [email or web address].    Your next appointment:   12 month(s) You will receive a reminder letter in the mail two months in advance. If you don't receive a letter, please call our office to schedule the follow-up appointment.   The format for your next appointment:   In Person  Provider:   You may see Skeet Latch, MD or one of the following Advanced Practice Providers on your designated Care Team:    Kerin Ransom, PA-C  [place], [place]  Coletta Memos, [place]

## 2020-09-18 ENCOUNTER — Other Ambulatory Visit: Payer: Medicare Other

## 2020-09-29 ENCOUNTER — Ambulatory Visit
Admission: RE | Admit: 2020-09-29 | Discharge: 2020-09-29 | Disposition: A | Discharge status: Home / Self Care | Payer: Medicare Other | Source: Ambulatory Visit | Attending: Internal Medicine | Admitting: Internal Medicine

## 2020-09-29 DIAGNOSIS — R7989 Other specified abnormal findings of blood chemistry: Secondary | ICD-10-CM

## 2020-10-06 IMAGING — RF DG HIP (WITH PELVIS) OPERATIVE*R*
1 series · 4 of 4 positions shown · non-contrast
Comparison: 06/27/2019

CLINICAL DATA: Operative imaging provided for right hip
arthroplasty.

EXAM:
OPERATIVE RIGHT HIP (WITH PELVIS IF PERFORMED) for VIEWS
TECHNIQUE: Fluoroscopic spot image(s) were submitted for interpretation
post-operatively.

[Series 1: run · 4 of 4 slices shown]
[im 1/4]
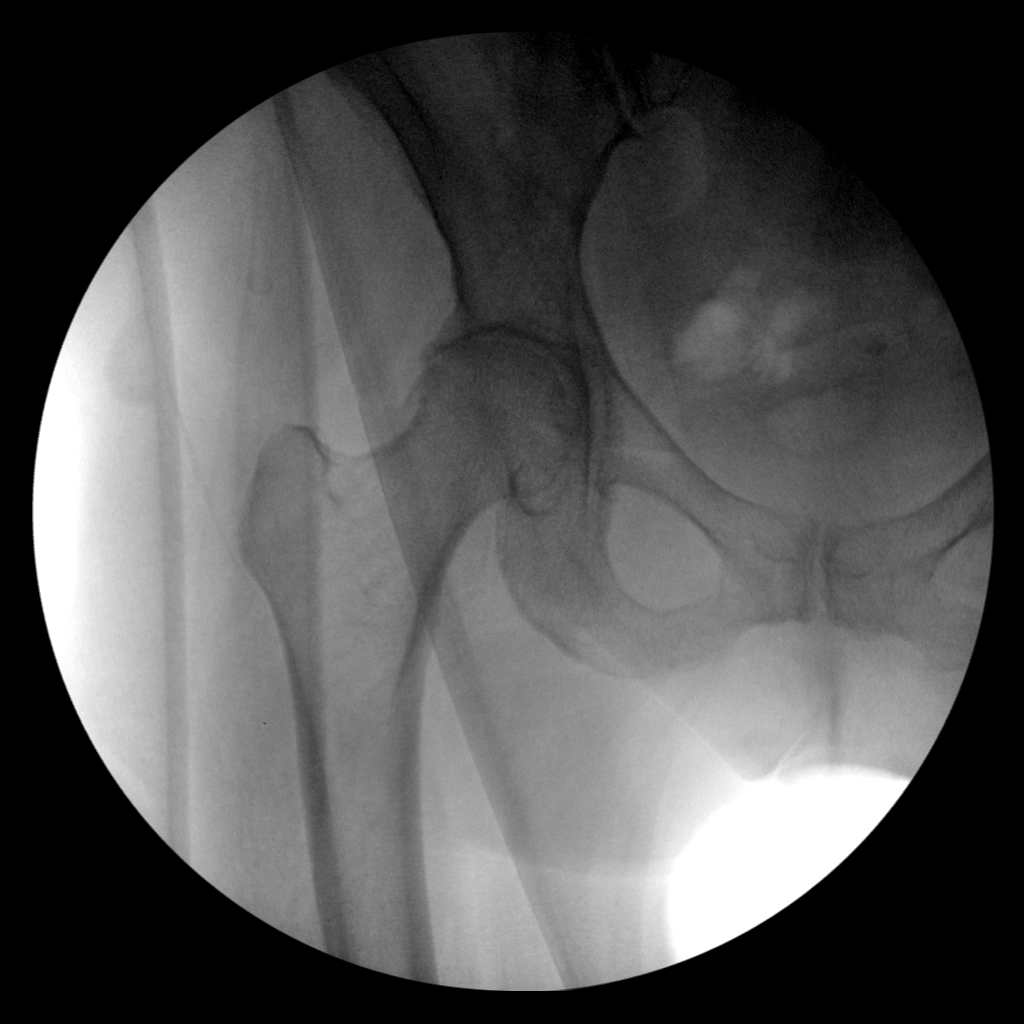
[im 2/4]
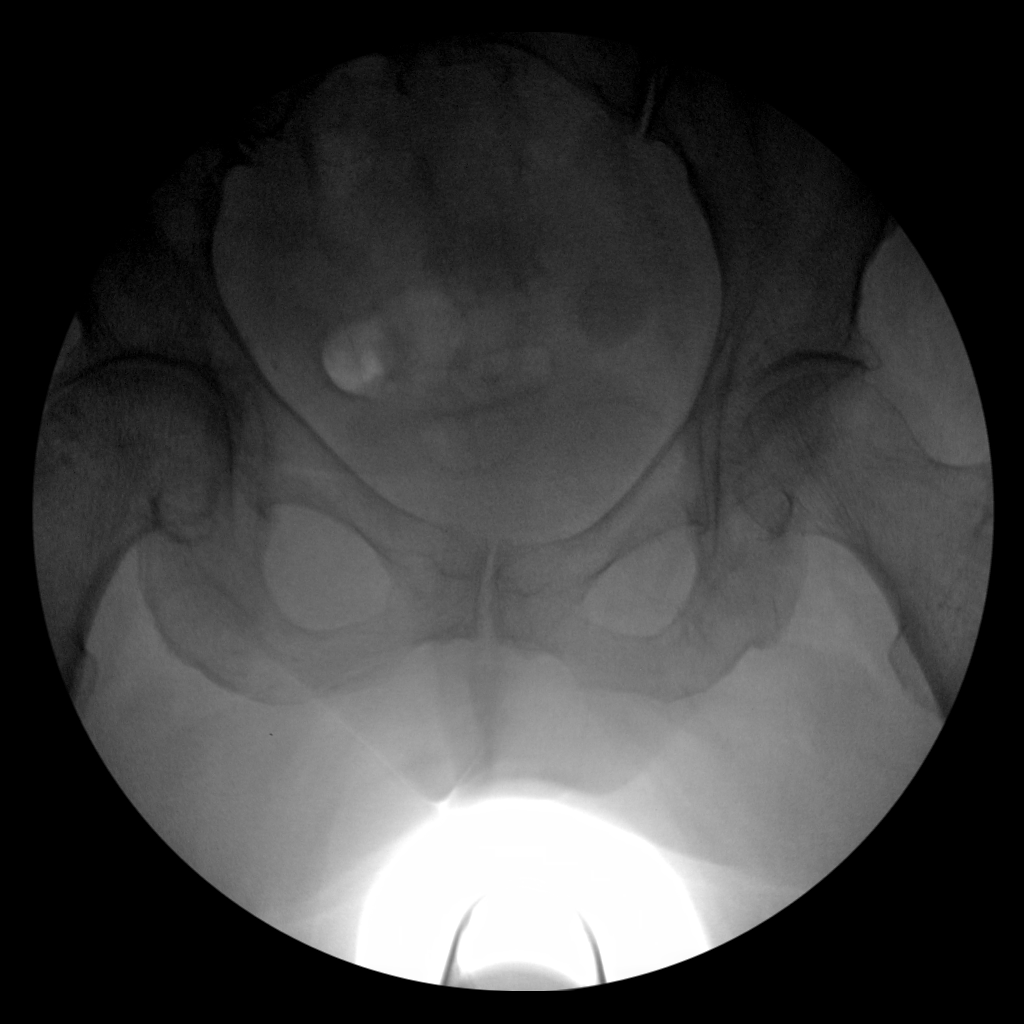
[im 3/4]
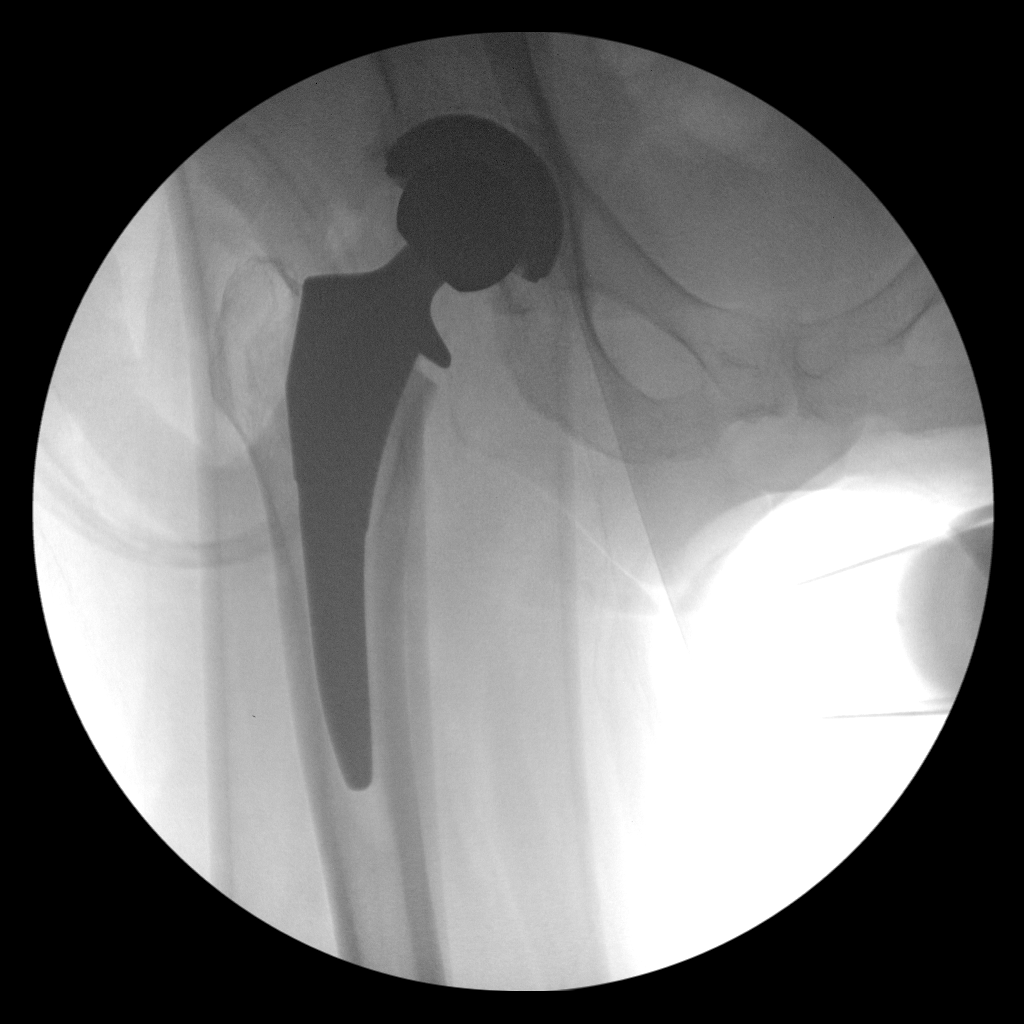
[im 4/4]
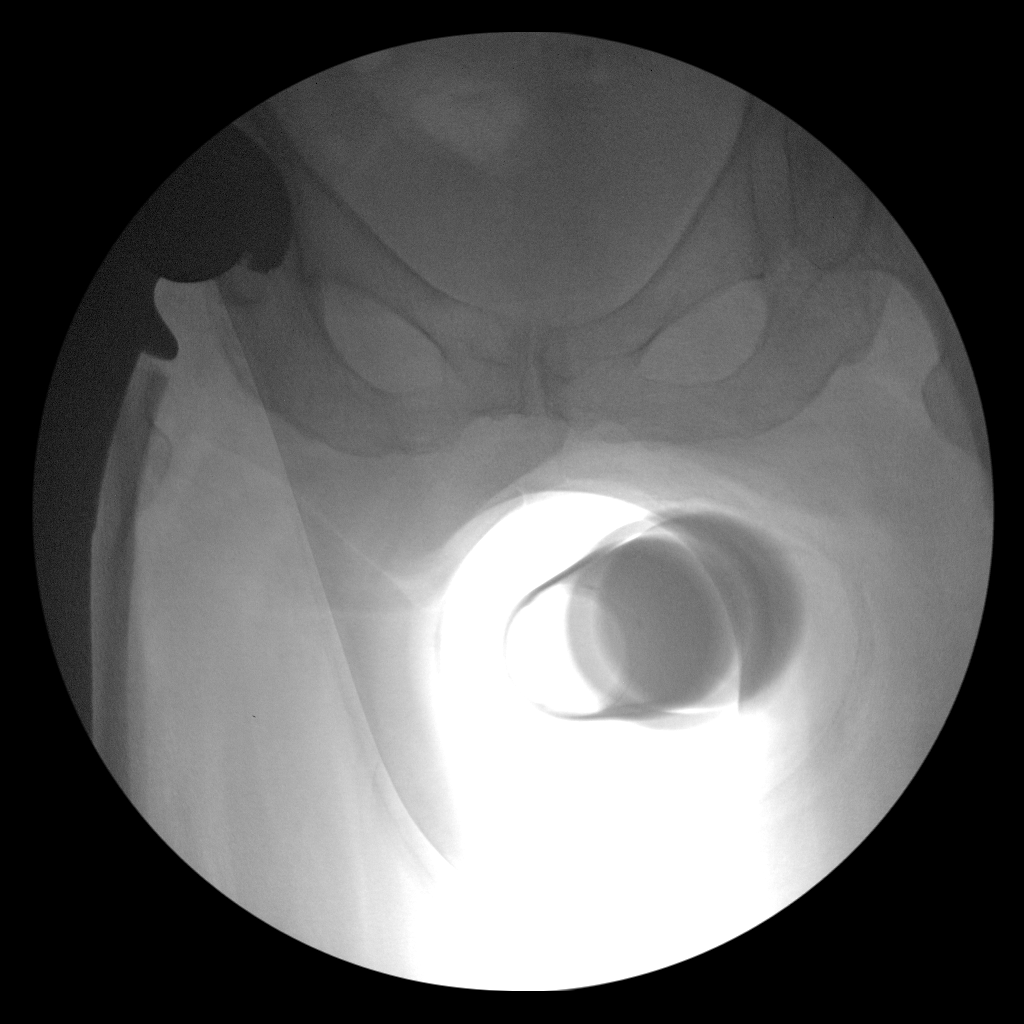

[4 of 4 positions shown; findings below may reference images not displayed]

FINDINGS: Images show placement of a total right hip arthroplasty. The femoral
and acetabular components appear well seated and aligned. No
evidence of an operative complication.
IMPRESSION: Well-positioned total right hip arthroplasty.

## 2021-03-13 ENCOUNTER — Telehealth: Payer: Medicare Other | Admitting: Nurse Practitioner

## 2021-03-13 DIAGNOSIS — U071 COVID-19: Secondary | ICD-10-CM | POA: Diagnosis not present

## 2021-03-13 MED ORDER — NIRMATRELVIR/RITONAVIR (PAXLOVID)TABLET: 3.0000 | ORAL_TABLET | Freq: Two times a day (BID) | ORAL | 0 refills | Status: AC

## 2021-03-13 MED ORDER — BENZONATATE 100 MG PO CAPS: 100.0000 mg | ORAL_CAPSULE | Freq: Three times a day (TID) | ORAL | 0 refills | Status: DC | PRN

## 2021-03-13 NOTE — Patient Instructions (Signed)
You are being prescribed PAXLOVID for COVID-19 infection.     [facility]   Please call the pharmacy or go through the drive through vs going inside if you are picking up the mediation yourself to prevent further spread. If prescribed to a [facility] affiliated pharmacy, a pharmacist will bring the medication out to your car.   Medications to hold while taking this treatment: none  *If asked to hold, you can resume them 24 hours after your last dose   ADMINISTRATION INSTRUCTIONS: Take with or without food. Swallow the tablets whole. Don't chew, crush, or break the medications because it might not work as well  For each dose of the medication, you should be taking 3 tablets together (2 pink oval and 1 white oval) TWICE a day for FIVE days   Finish your full five-day course of Paxlovid even if you feel better before you're done. Stopping this medication too early can make it less effective to prevent severe illness related to [place].    Paxlovid is prescribed for YOU ONLY. Don't share it with others, even if they have similar symptoms as you. This medication might not be right for everyone.  Make sure to take steps to protect yourself and others while you're taking this medication in order to get well soon and to prevent others from getting sick with COVID-19.  Paxlovid (nirmatrelvir / ritonavir) can cause hormonal birth control medications to not work well. If you or your partner is currently taking hormonal birth control, use condoms or other birth control methods to prevent unintended pregnancies.    COMMON SIDE EFFECTS: Altered or bad taste in your mouth  Diarrhea  High blood pressure (1% of people) Muscle aches (1% of people)     If your COVID-19 symptoms get worse, get medical help right away. Call 911 if you experience symptoms such as worsening cough, trouble breathing, chest pain that doesn't go away,  confusion, a hard time staying awake, and pale or blue-colored skin. This medication won't prevent all COVID-19 cases from getting worse.

## 2021-03-13 NOTE — Progress Notes (Signed)
Virtual Visit Consent   Julie [name], you are scheduled for a virtual visit with Julie [name], [place], a [place] provider, today.     Just as with appointments in the office, your consent must be obtained to participate.  Your consent will be active for this visit and any virtual visit you may have with one of our providers in the next 365 days.     If you have a MyChart account, a copy of this consent can be sent to you electronically.  All virtual visits are billed to your insurance company just like a traditional visit in the office.    As this is a virtual visit, video technology does not allow for your provider to perform a traditional examination.  This may limit your provider's ability to fully assess your condition.  If your provider identifies any concerns that need to be evaluated in person or the need to arrange testing (such as labs, EKG, etc.), we will make arrangements to do so.     Although advances in technology are sophisticated, we cannot ensure that it will always work on either your end or our end.  If the connection with a video visit is poor, the visit may have to be switched to a telephone visit.  With either a video or telephone visit, we are not always able to ensure that we have a secure connection.     I need to obtain your verbal consent now.   Are you willing to proceed with your visit today? YES   Julie [name] has provided verbal consent on 03/13/2021 for a virtual visit (video or telephone).   Julie Hassell Done, FNP   Date: 03/13/2021 12:09 PM   Virtual Visit via Video Note   I, Julie [name], connected with Julie [name] ([id number], [birth year]) on 03/13/21 at 12:15 PM EDT by a video-enabled telemedicine application and verified that I am speaking with the correct person using two identifiers.  Location: Patient: Virtual Visit Location Patient: Home Provider: Virtual Visit Location Provider: Mobile   I discussed the limitations of  evaluation and management by telemedicine and the availability of in person appointments. The patient expressed understanding and agreed to proceed.    History of Present Illness: Julie [name] is a 80 y.o. who identifies as a female who was assigned female at birth, and is being seen today for covid.  HPI: HPI  Patient states that he developed a sore throat, dry cough and runny nose on Thursday. He says today he feels fatigued. Tested positive for covid today. 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   Closed fracture of middle third of scaphoid bone of left wrist 03/31/2016   Lateral meniscal tear 01/25/2016   Patellofemoral arthralgia of right knee 01/25/2016   TIA (transient ischemic attack) 10/05/2015   Polycythemia 10/05/2015   Diplopia 10/05/2015   Thyroid disease    Celiac disease     Allergies:  Allergies  Allergen Reactions   Propantheline Rash  Cannot remember other symptoms    Atorvastatin     Myalgia    Gluten Meal Diarrhea    bloating   Sulfa Antibiotics Swelling   Zetia [Ezetimibe]     Myalgias    Demerol [Meperidine] Rash   Morphine And Related Rash   Medications:  Current Outpatient Medications:    alendronate (FOSAMAX) 70 MG tablet, , Disp: , Rfl:    amoxicillin (AMOXIL) 500 MG tablet, Take 1,000 mg by mouth 2 (two) times daily., Disp: , Rfl:    Apoaequorin (PREVAGEN EXTRA STRENGTH PO), Take by mouth. 1 capsule Daily, Disp: , Rfl:    Ascorbic Acid  (VITAMIN C) 1000 MG tablet, Take 1,000 mg by mouth daily. 1 Tablet Daily, Disp: , Rfl:    cholecalciferol (VITAMIN D) 1000 units tablet, Take 1,000 Units by mouth daily., Disp: , Rfl:    famotidine (PEPCID) 40 MG tablet, Take by mouth., Disp: , Rfl:    ketoconazole (NIZORAL) 2 % shampoo, , Disp: , Rfl:    levothyroxine (SYNTHROID, LEVOTHROID) 125 MCG tablet, Take 1 tablet (125 mcg total) by mouth every other day. (Patient taking differently: Take 125 mcg by mouth every evening.), Disp: 30 tablet, Rfl: 0   Misc Natural Products (TART CHERRY ADVANCED) CAPS, Take 1 capsule by mouth at bedtime., Disp: , Rfl:    Multiple Vitamins-Minerals (MULTIVITAMIN WITH MINERALS) tablet, Take 1 tablet by mouth daily., Disp: , Rfl:   Observations/Objective: Patient is well-developed, well-nourished in no acute distress.  Resting comfortably  at home.  Head is normocephalic, atraumatic.  No labored breathing.  Speech is clear and coherent with logical content.  Patient is alert and oriented at baseline.  Voice hoarse Deep dry cough  Assessment and Plan:  Julie [name] in today with chief complaint of No chief complaint on file.   1. Lab test positive for detection of COVID-19 virus 1. Take meds as prescribed 2. Use a cool mist humidifier especially during the winter months and when heat has been humid. 3. Use saline nose sprays frequently 4. Saline irrigations of the nose can be very helpful if [name] frequently.  * 4X daily for 1 week*  * Use of a nettie pot can be helpful with this. Follow directions with this* 5. Drink plenty of fluids 6. Keep thermostat turn down low 7.For any cough or congestion  Use plain Mucinex- regular strength or max strength is fine   * Children- consult with Pharmacist for dosing 8. For fever or aces or pains- take tylenol or ibuprofen appropriate for age and weight.  * for fevers greater than 101 orally you may alternate ibuprofen and tylenol every  3 hours.   Meds  ordered this encounter  Medications   nirmatrelvir/ritonavir EUA (PAXLOVID) TABS    Sig: Take 3 tablets by mouth 2 (two) times daily for 5 days. (Take nirmatrelvir 150 mg two tablets twice daily for 5 days and ritonavir 100 mg one tablet twice daily for 5 days) Patient GFR is 70    Dispense:  30 tablet    Refill:  0    Order Specific Question:   Supervising Provider    Answer:   Sabra Heck, BRIAN [[id number]]   benzonatate (TESSALON PERLES) 100 MG capsule    Sig: Take 1 capsule (100 mg total) by mouth 3 (three) times daily as needed.    Dispense:  20 capsule    Refill:  0    Order Specific Question:   Supervising Provider    Answer:  MILLER, BRIAN [[id number]]        Follow Up Instructions: I discussed the assessment and treatment plan with the patient. The patient was provided an opportunity to ask questions and all were answered. The patient agreed with the plan and demonstrated an understanding of the instructions.  A copy of instructions were sent to the patient via MyChart.  The patient was advised to call back or seek an in-person evaluation if the symptoms worsen or if the condition fails to improve as anticipated.  Time:  I spent 12 minutes with the patient via telehealth technology discussing the above problems/concerns.    Julie Hassell Done, FNP

## 2021-10-15 ENCOUNTER — Telehealth (HOSPITAL_BASED_OUTPATIENT_CLINIC_OR_DEPARTMENT_OTHER): Payer: Self-pay | Admitting: Cardiovascular Disease

## 2021-10-15 ENCOUNTER — Ambulatory Visit (INDEPENDENT_AMBULATORY_CARE_PROVIDER_SITE_OTHER): Payer: Medicare Other | Admitting: Cardiovascular Disease

## 2021-10-15 ENCOUNTER — Other Ambulatory Visit: Payer: Self-pay

## 2021-10-15 ENCOUNTER — Encounter (HOSPITAL_BASED_OUTPATIENT_CLINIC_OR_DEPARTMENT_OTHER): Payer: Self-pay | Admitting: Cardiovascular Disease

## 2021-10-15 VITALS — BP 134/82 | HR 65 | Ht 66.0 in | Wt 151.0 lb

## 2021-10-15 DIAGNOSIS — I2510 Atherosclerotic heart disease of native coronary artery without angina pectoris: Secondary | ICD-10-CM | POA: Diagnosis not present

## 2021-10-15 DIAGNOSIS — Z5181 Encounter for therapeutic drug level monitoring: Secondary | ICD-10-CM | POA: Diagnosis not present

## 2021-10-15 DIAGNOSIS — E7800 Pure hypercholesterolemia, unspecified: Secondary | ICD-10-CM

## 2021-10-15 DIAGNOSIS — R413 Other amnesia: Secondary | ICD-10-CM

## 2021-10-15 HISTORY — DX: Other amnesia: R41.3

## 2021-10-15 MED ORDER — REPATHA SURECLICK 140 MG/ML ~~LOC~~ SOAJ: 140.0000 mg | SUBCUTANEOUS | 12 refills | Status: DC

## 2021-10-15 NOTE — Assessment & Plan Note (Signed)
LDL goal is less than 70.  Starting Repatha as above.

## 2021-10-15 NOTE — Patient Instructions (Addendum)
Medication Instructions:  START REPATHA 140 MG 1 INJECTION EVERY 2 WEEKS  START ASPIRIN 81 MG DAILY   *If you need a refill on your cardiac medications before your next appointment, please call your pharmacy*  Lab Work: FASTING LP/CMET END OF April   If you have labs (blood work) drawn today and your tests are completely normal, you will receive your results only by: [place] (if you have MyChart) OR A paper copy in the mail If you have any lab test that is abnormal or we need to change your treatment, we will call you to review the results.  Testing/Procedures: NONE  Follow-Up: At [facility], you and your health needs are our priority.  As part of our continuing mission to provide you with exceptional heart care, we have created designated Provider Care Teams.  These Care Teams include your primary Cardiologist (physician) and Advanced Practice Providers (APPs -  Physician Assistants and Nurse Practitioners) who all work together to provide you with the care you need, when you need it.  We recommend signing up for the patient portal called "MyChart".  Sign up information is provided on this After Visit Summary.  MyChart is used to connect with patients for Virtual Visits (Telemedicine).  Patients are able to view lab/test results, encounter notes, upcoming appointments, etc.  Non-urgent messages can be sent to your provider as well.   To learn more about what you can do with MyChart, go to [email or web address].    Your next appointment:   6 month(s)  The format for your next appointment:   In Person  Provider:   Skeet Latch, MD   You have been referred to [place]   Ambulatory referral to Neurology  Where: [facility] Address: [address] [place] [address] Phone: [phone]  IF YOU DO NOT HEAR FROM OFFICE IN 2 WEEKS YOU CAN CALL THEM DIRECTLY AT [place]

## 2021-10-15 NOTE — Telephone Encounter (Signed)
Received fax from [place] stating PA required for Julie [name]. PA started today through CoverMyMeds.  Shaheen Star (Key: [id number]) Rx #: [id number] Repatha SureClick 992EQ/AS auto-injectors   Form [employer] Medicare Part D Electronic Prior Authorization Form (2017 NCPDP)  Determination Wait for Determination Please wait for [employer] 2017 NCPDP to return a determination.

## 2021-10-15 NOTE — Assessment & Plan Note (Signed)
Ms. Bensen notes that she has been forgetful and struggles with memory loss.  She does have a history of a TIA.  We are working to better control her lipids as above.  We will also refer her to neurology to assess whether this is expected for age or more significant.

## 2021-10-15 NOTE — Assessment & Plan Note (Signed)
She has had mild plaque on prior imaging in 2018.  She is doing well and has no exertional symptoms.  Continue with exercise.  She will add aspirin 81 mg daily.  She has not tolerated statins.  We will start Repatha 140 mg injected every 2 weeks.  Repeat lipids and a CMP in 2 to 3 months.

## 2021-10-15 NOTE — Progress Notes (Signed)
Cardiology Office Note   Date:  10/15/2021   ID:  Julie [name], DOB March 26, 1941, MRN [id number]  PCP:  Velna Hatchet, MD  Cardiologist:   Skeet Latch, MD   No chief complaint on file.    History of Present Illness: Julie [name] is a 81 y.o. female with nonobstructive CAD, hypothyroidism, mild COPD, TIA, hyperlipidemia, Celiac disease, and skin cancer who presents for follow up.  She was initially seen 05/2017 for the evaluation of an abnormal EKG. Julie [name] saw her PCP 04/11/17 and was noted to have T-wave inversions in the anterolateral leads. She had previously stopped her statin due to concern of memory loss. She was feeling generally well but had an episode of chest pain 03/2017 that occurred in the setting of a stressful event.  She was referred for a coronary CT-A that showed mild plaque in the proximal LAD and D1.  There was no obstructive disease.  Julie [name] atorvastatin was increased due to LDL not at goal.  There was significant improvement in her LDL, but she developed myalgias.  Atorvastatin was reduced back to the previous dose and Zetia was added.  She continued to struggle with myalgias.  She took rosuvastatin but her LFTs have been elevated.  She discontinued rosuvastatin and reduce her alcohol intake by half.  However LFTs continue to increase.  Most recently she has been off all statins and her lipids have been above goal.  She was diagnosed with nonalcoholic fatty liver disease.  She has been exercising with deep water aerobics 3 days a week.  She also continues to golf.  She went on a cruise to the [place] recently overall she has felt well.  She has no chest pain or shortness of breath.  She denies lower extremity edema, orthopnea, or PND.  She has been struggling with the aging process.  She complains of pain in her back and her right knee that limit her ability to walk or hike.  She was also diagnosed with macular degeneration and is getting eye injections.  She is now  using hearing aid and is struggling with memory loss.  She tried using Prevagen and did not notice any improvement.  She notes that her roommate often tells her that she is forgetful.  Past Medical History:  Diagnosis Date   Arthritis    Asthma    CAD in native artery 06/06/2017   Mild LAD disease on coronary CT-A 05/2017.   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   Thyroid disease    TIA (transient ischemic attack)    2017   Transaminitis 09/15/2020    Past Surgical History:  Procedure Laterality Date   ABDOMINAL HYSTERECTOMY     BLADDER SURGERY     hole in bladder from hysterectomy   BREAST SURGERY     biopsy breast benign   EYE SURGERY     bil cataract   TOTAL HIP ARTHROPLASTY Right 08/06/2019   Procedure: RIGHT TOTAL HIP ARTHROPLASTY ANTERIOR APPROACH;  Surgeon: Melrose Nakayama, MD;  Location: [facility];  Service: Orthopedics;  Laterality: Right;   TOTAL THYROIDECTOMY       Current Outpatient Medications  Medication Sig Dispense Refill   amoxicillin (AMOXIL) 500 MG tablet Take 1,000 mg by mouth. Dental work only     cholecalciferol (VITAMIN D) 1000 units tablet Take 1,000 Units by mouth daily.     famotidine (PEPCID) 40 MG tablet Take by mouth as needed.     ketoconazole (NIZORAL) 2 % shampoo as needed.     levothyroxine (SYNTHROID) 125 MCG tablet Take 125 mcg by mouth daily before breakfast.     Multiple Vitamins-Minerals (MULTIVITAMIN WITH MINERALS) tablet Take 1 tablet by mouth daily.     Multiple Vitamins-Minerals (PRESERVISION AREDS 2 PO) Take 1 tablet by mouth daily.     No current facility-administered medications for this visit.    Allergies:   Propantheline, Atorvastatin, Gluten meal,  Rosuvastatin, Sulfa antibiotics, Zetia [ezetimibe], Demerol [meperidine], and Morphine and related    Social History:  The patient  reports that she has never smoked. She has never used smokeless tobacco. She reports current alcohol use. She reports that she does not use drugs.   Family History:  The patient's family history includes Dementia in her father; Peripheral Artery Disease in her mother.    ROS:  Please see the history of present illness.  Otherwise, review of systems are positive for R knee pain/swelling.   All other systems are reviewed and negative.    PHYSICAL EXAM: VS:  BP 134/82 (BP Location: Left Arm, Patient Position: Sitting, Cuff Size: Normal)    Pulse 65    Ht 5' 6"  (1.676 m)    Wt 151 lb (68.5 kg)    BMI 24.37 kg/m  , BMI Body mass index is 24.37 kg/m. GENERAL:  Well appearing HEENT: Pupils equal round and reactive, fundi not visualized, oral mucosa unremarkable NECK:  No jugular venous distention, waveform within normal limits, carotid upstroke brisk and symmetric, no bruits, no thyromegaly LUNGS:  Clear to auscultation bilaterally HEART:  RRR.  PMI not displaced or sustained,S1 and S2 within normal limits, no S3, no S4, no clicks, no rubs, no murmurs ABD:  Flat, positive bowel sounds normal in frequency in pitch, no bruits, no rebound, no guarding, no midline pulsatile mass, no hepatomegaly, no splenomegaly EXT:  2 plus pulses throughout, no edema, no cyanosis no clubbing SKIN:  No rashes no nodules NEURO:  Cranial nerves II through XII grossly intact, motor grossly intact throughout PSYCH:  Cognitively intact, oriented to person place and time  EKG:  EKG is ordered today. The ekg ordered today demonstrates Sinus bradycardia. Rate 56 bpm. The axis deviation. 04/11/17: Sinus bradycardia. Rate 57 bpm. Left axis deviation. Anterior T-wave inversions cannot rule out prior septal infarct 10/05/15: Sinus bradycardia. Rate 52 bpm. Cannot rule out prior septal  infarct 09/15/20: Sinus rhytm. Rate 60 bpm. 10/15/2021: Sinus rhythm.  Rate 65 bpm.  LAFB.  Coronary CT-A 05/29/17:  Coronary Arteries: Right dominant with no anomalies   LM:  No plaque or stenosis. LAD system: Mixed plaque without significant stenosis ostial LAD. There was an area of soft plaque in the proximal LAD with mild stenosis. Small to moderate D1 with mixed plaque, no significant stenosis proximally. Moderate D2 without significant disease. Circumflex system: Small ramus, no significant disease. No plaque or stenosis in the LCx system. RCA system:  No plaque or stenosis.   Recent Labs: No results found for requested labs within last 8760 hours.    Lipid Panel    Component Value Date/Time   CHOL 220 (H) 02/07/2018 0823   TRIG 132 02/07/2018 9798  HDL 52 02/07/2018 0823   CHOLHDL 4.2 02/07/2018 0823   CHOLHDL 4.8 10/05/2015 1349   VLDL 44 (H) 10/05/2015 1349   LDLCALC 142 (H) 02/07/2018 4650   04/03/17:  Total cholesterol 235, triglycerides 141, HDL 49, LDL 158 TSH 0.82, free T4 1 41 sodium 141, potassium 4.6, BUN 17, creatinine 0.8 AST 15, ALT 50 WBC 4.82, hemoglobin 18.1, hematocrit 45.8, platelets 260  08/17/2021: Total cholesterol 270, triglycerides 124, HDL 57, LDL 188 TSH 3.24, free T41.2     Wt Readings from Last 3 Encounters:  10/15/21 151 lb (68.5 kg)  09/15/20 154 lb (69.9 kg)  04/27/20 152 lb (68.9 kg)      ASSESSMENT AND PLAN:  CAD in native artery She has had mild plaque on prior imaging in 2018.  She is doing well and has no exertional symptoms.  Continue with exercise.  She will add aspirin 81 mg daily.  She has not tolerated statins.  We will start Repatha 140 mg injected every 2 weeks.  Repeat lipids and a CMP in 2 to 3 months.  Pure hypercholesterolemia LDL goal is less than 70.  Starting Repatha as above.  Memory loss Julie [name] notes that she has been forgetful and struggles with memory loss.  She does have a history of a TIA.  We are  working to better control her lipids as above.  We will also refer her to neurology to assess whether this is expected for age or more significant.    Current medicines are reviewed at length with the patient today.  The patient does not have concerns regarding medicines.  The following changes have been made: stop atorvastatin and ezetimibe.  Labs/ tests ordered today include:   No orders of the defined types were placed in this encounter.    Disposition:   FU with Tyrann Donaho C. Oval Linsey, MD, [facility] in 1 year.  I feel it   This note was written with the assistance of speech recognition software.  Please excuse any transcriptional errors.  Signed, Kenadee Gates C. Oval Linsey, MD, [facility]  10/15/2021 8:51 AM    [facility]

## 2021-10-15 NOTE — Telephone Encounter (Signed)
Advised patient PA started, verbalized understanding

## 2021-10-15 NOTE — Telephone Encounter (Signed)
New Message:   Patient said Dr Oval Linsey prescribed Repatha today. She said to tell Dr Oval Linsey at this pont, unless her insurance pays a big part.she can not afford the Repatha. She will know for sure the first of next week.

## 2021-10-28 ENCOUNTER — Encounter (HOSPITAL_BASED_OUTPATIENT_CLINIC_OR_DEPARTMENT_OTHER): Payer: Self-pay

## 2021-10-28 NOTE — Telephone Encounter (Addendum)
°  Request Reference Number: [id number]. REPATHA SURE INJ 140MG/ML is approved through 04/14/2022  Above per covermymeds  Mychart message sent to patient advising ok to fill

## 2021-12-01 NOTE — Progress Notes (Signed)
? ?[facility] ? ?PATIENT: Julie [name] ?DOB: Jan 24, 1941 ? ?REFERRING CLINICIAN: Skeet Latch, MD ?HISTORY FROM: self, partner Arbie Cookey ?REASON FOR VISIT: memory loss ? ? ?HISTORICAL ? ?CHIEF COMPLAINT:  ?Chief Complaint  ?Patient presents with  ? Memory Loss  ?  Rm 1 New Pt  partnerArbie Cookey  MOCA 27  ? ? ?HISTORY OF PRESENT ILLNESS:  ?The patient presents for evaluation of memory loss which has been present over the past few years. She will forget conversations she had the night before. She can remember when prompted with clues. Remote memory is intact. She will sometimes tell the same story multiple times and repeat herself in conversation. Notes some word finding difficulty as well. She does wear hearing aids and feels they do not work as well as she should because she still has trouble hearing conversations at times. ? ?She is concerned because her father had dementia. ? ? ?TBI:  No past history of TBI ?Stroke:  Hx TIA in 2017 ?Seizures:  no past history of seizures ?Sleep: sleeps well at night, sometimes wakes up very early in the morning (around 4 AM) ?Mood: She will get anxious when struggling to remember something, otherwise no mood issues ? ?Functional status:  ?Patient lives with her partner ?Cooking: chops vegetables and helps clean up, partner does most of the cooking ?Cleaning: misplaces her phone frequently, otherwise no issues ?Shopping: uses a list, no issues ?Driving: has gotten lost going to unfamiliar locations, uses GPS for those situations ?Bills: she handles her own finances without issues ?Medications: no issues unless she has medications with TID dosing ?Getting lost going to familiar places?: no ?Forgetting loved ones names?: no ?Word finding difficulty? Yes, able to get word with prompting ? ?OTHER MEDICAL CONDITIONS: TIA, COPD, CAD, Celiac disease, HLD, statin myopathy, nonalcoholic fatty liver disease ? ? ?REVIEW OF SYSTEMS: Full 14 system review of systems performed  and negative with exception of: memory loss ? ?ALLERGIES: ?Allergies  ?Allergen Reactions  ? Propantheline Rash  ?  Cannot remember other symptoms ?  ? Atorvastatin   ?  Myalgia   ? Gluten Meal Diarrhea  ?  bloating  ? Rosuvastatin   ? Sulfa Antibiotics Swelling  ? Zetia [Ezetimibe]   ?  Myalgias   ? Demerol [Meperidine] Rash  ? Morphine And Related Rash  ? ? ?HOME MEDICATIONS: ?Outpatient Medications Prior to Visit  ?Medication Sig Dispense Refill  ? aspirin EC 81 MG tablet Take 81 mg by mouth daily. Swallow whole.    ? cholecalciferol (VITAMIN D) 1000 units tablet Take 1,000 Units by mouth daily.    ? Evolocumab (REPATHA SURECLICK) 465 MG/ML SOAJ Inject 140 mg into the skin every 14 (fourteen) days. 2 mL 12  ? levothyroxine (SYNTHROID) 125 MCG tablet Take 125 mcg by mouth daily before breakfast.    ? Multiple Vitamins-Minerals (MULTIVITAMIN WITH MINERALS) tablet Take 1 tablet by mouth daily.    ? Multiple Vitamins-Minerals (PRESERVISION AREDS 2 PO) Take 1 tablet by mouth daily.    ? amoxicillin (AMOXIL) 500 MG tablet Take 1,000 mg by mouth. Dental work only (Patient not taking: Reported on 12/02/2021)    ? famotidine (PEPCID) 40 MG tablet Take by mouth as needed. (Patient not taking: Reported on 12/02/2021)    ? ketoconazole (NIZORAL) 2 % shampoo as needed. (Patient not taking: Reported on 12/02/2021)    ? ?No facility-administered medications prior to visit.  ? ? ?PAST MEDICAL HISTORY: ?Past Medical History:  ?Diagnosis Date  ?  Arthritis   ? Asthma   ? CAD in native artery 06/06/2017  ? Mild LAD disease on coronary CT-A 05/2017.  ? Cancer [facility])   ? skin  ? Celiac disease   ? Hyperlipidemia 05/11/2017  ? Hypertension   ? pt denies at preop  ? Hypothyroidism   ? Memory loss 10/15/2021  ? Polycythemia   ? Pure hypercholesterolemia 09/15/2020  ? 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 Left Post Arm(Dr. Dellis Filbert)  ? 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 Thyroid disease   ? TIA (transient ischemic attack)   ? 2017  ?  Transaminitis 09/15/2020  ? ? ?PAST SURGICAL HISTORY: ?Past Surgical History:  ?Procedure Laterality Date  ? ABDOMINAL HYSTERECTOMY    ? BLADDER SURGERY    ? hole in bladder from hysterectomy  ? BREAST SURGERY    ? biopsy breast benign  ? EYE SURGERY    ? bil cataract  ? TOTAL HIP ARTHROPLASTY Right 08/06/2019  ? Procedure: RIGHT TOTAL HIP ARTHROPLASTY ANTERIOR APPROACH;  Surgeon: Melrose Nakayama, MD;  Location: [facility];  Service: Orthopedics;  Laterality: Right;  ? TOTAL THYROIDECTOMY    ? ? ?FAMILY HISTORY: ?Family History  ?Problem Relation Age of Onset  ? Peripheral Artery Disease Mother   ? Dementia Father   ? ? ?SOCIAL HISTORY: ?Social History  ? ?Socioeconomic History  ? Marital status: Soil scientist  ?  Spouse name: Arbie Cookey  ? Number of children: Not on file  ? Years of education: Not on file  ? Highest education level: Master's degree (e.g., MA, MS, MEng, MEd, MSW, MBA)  ?Occupational History  ? Not on file  ?Tobacco Use  ? Smoking status: Never  ? Smokeless tobacco: Never  ?Vaping Use  ? Vaping Use: Never used  ?Substance and Sexual Activity  ? Alcohol use: Yes  ?  Comment: 12/02/21 3 daily  ? Drug use: No  ? Sexual activity: Not Currently  ?Other Topics Concern  ? Not on file  ?Social History Narrative  ? Lives with partner  ? ?Social Determinants of Health  ? ?Financial Resource Strain: Not on file  ?Food Insecurity: Not on file  ?Transportation Needs: Not on file  ?Physical Activity: Not on file  ?Stress: Not on file  ?Social Connections: Not on file  ?Intimate Partner Violence: Not on file  ? ? ? ?PHYSICAL EXAM ? ? ?GENERAL EXAM/CONSTITUTIONAL: ?Vitals:  ?Vitals:  ? 12/02/21 0814  ?BP: 132/79  ?Pulse: 63  ?Weight: 152 lb 6.4 oz (69.1 kg)  ?Height: 5' 5"  (1.651 m)  ? ?Body mass index is 25.36 kg/m?. ?Wt Readings from Last 3 Encounters:  ?12/02/21 152 lb 6.4 oz (69.1 kg)  ?10/15/21 151 lb (68.5 kg)  ?09/15/20 154 lb (69.9 kg)  ? ? ?CARDIOVASCULAR: ?Examination of peripheral vascular system by observation  and palpation is normal ? ? ? ?MUSCULOSKELETAL: ?Gait, strength, tone, movements noted in Neurologic exam below ? ?NEUROLOGIC: ?MENTAL STATUS:  ? ?  12/02/2021  ?  8:20 AM  ?Montreal Cognitive Assessment   ?Visuospatial/ Executive (0/5) 5  ?Naming (0/3) 3  ?Attention: Read list of digits (0/2) 2  ?Attention: Read list of letters (0/1) 1  ?Attention: Serial 7 subtraction starting at 100 (0/3) 3  ?Language: Repeat phrase (0/2) 2  ?Language : Fluency (0/1) 1  ?Abstraction (0/2) 2  ?Delayed Recall (0/5) 4  ?Orientation (0/6) 6  ?Total 29  ? ? ?CRANIAL NERVE:  ?2nd, 3rd, 4th, 6th - pupils equal and reactive to light,  visual fields full to confrontation, extraocular muscles intact, no nystagmus ?5th - facial sensation symmetric ?7th - facial strength symmetric ?8th - hearing grossly intact ? ?MOTOR:  ?normal bulk and tone, no cogwheeling, full strength in the BUE, BLE ? ?SENSORY:  ?normal and symmetric to light touch all 4 extremties ? ?COORDINATION:  ?finger-nose-finger, fine finger movements normal, no tremor ? ?REFLEXES:  ?deep tendon reflexes present and symmetric ? ?GAIT/STATION:  ?normal ? ? ? ? ?DIAGNOSTIC DATA (LABS, IMAGING, TESTING) ?- I reviewed patient records, labs, notes, testing and imaging myself where available. ? ?Lab Results  ?Component Value Date  ? WBC 4.5 08/05/2019  ? HGB 14.9 08/05/2019  ? HCT 45.4 08/05/2019  ? MCV 98.7 08/05/2019  ? PLT 249 08/05/2019  ? ?   ?Component Value Date/Time  ? NA 140 08/05/2019 0953  ? NA 143 07/20/2017 0858  ? K 4.8 08/05/2019 0953  ? CL 107 08/05/2019 0953  ? CO2 25 08/05/2019 0953  ? GLUCOSE 90 08/05/2019 0953  ? BUN 20 08/05/2019 0953  ? BUN 13 07/20/2017 0858  ? CREATININE 0.78 08/05/2019 0953  ? CALCIUM 8.9 08/05/2019 0953  ? PROT 6.1 02/07/2018 0823  ? ALBUMIN 3.9 02/07/2018 0823  ? AST 31 02/07/2018 0823  ? ALT 40 (H) 02/07/2018 3491  ? ALKPHOS 70 02/07/2018 0823  ? BILITOT 0.6 02/07/2018 0823  ? GFRNONAA >60 08/05/2019 0953  ? GFRAA >60 08/05/2019 0953  ? ?Lab  Results  ?Component Value Date  ? CHOL 220 (H) 02/07/2018  ? HDL 52 02/07/2018  ? LDLCALC 142 (H) 02/07/2018  ? TRIG 132 02/07/2018  ? CHOLHDL 4.2 02/07/2018  ? ?Lab Results  ?Component Value Date  ? HGBA1C 5.7

## 2021-12-02 ENCOUNTER — Encounter: Payer: Self-pay | Admitting: Psychiatry

## 2021-12-02 ENCOUNTER — Ambulatory Visit (INDEPENDENT_AMBULATORY_CARE_PROVIDER_SITE_OTHER): Payer: Medicare Other | Admitting: Psychiatry

## 2021-12-02 VITALS — BP 132/79 | HR 63 | Ht 65.0 in | Wt 152.4 lb

## 2021-12-02 DIAGNOSIS — R413 Other amnesia: Secondary | ICD-10-CM | POA: Diagnosis not present

## 2021-12-02 NOTE — Patient Instructions (Addendum)
Consider updating hearing aids ?Check to see if B12 level has been checked in the past year. If not let me know and I can place an order to have this done ? ?Tasks to improve attention/working memory ?1. Good sleep hygiene (7-8 hrs of sleep) ?2. Learning a new skill (Painting, Carpentry, Pottery, new language, Knitting). ?3.Cognitive exercises (keep a daily journal, Puzzles) ?4. Physical exercise and training  (30 min/day X 4 days week) ?5. Being on Antidepressant if needed ?6.Yoga, Meditation, Tai Chi ?7. Decrease alcohol intake ?8.Have a clear schedule and structure in daily routine ? ?

## 2021-12-16 ENCOUNTER — Other Ambulatory Visit: Payer: Self-pay | Admitting: Psychiatry

## 2021-12-16 ENCOUNTER — Telehealth: Payer: Self-pay | Admitting: Psychiatry

## 2021-12-16 DIAGNOSIS — R413 Other amnesia: Secondary | ICD-10-CM

## 2021-12-16 NOTE — Telephone Encounter (Signed)
Pt wanted to relay to Dr. Billey Gosling that she has not had B12 blood work done recently. Pt would like a call back about getting blood work done.  ?

## 2021-12-16 NOTE — Telephone Encounter (Signed)
Would you like pt to have b12 checked here or see PCP ?  ?

## 2021-12-16 NOTE — Telephone Encounter (Signed)
Contacted pt back, informed her she can ome into the office M-Thu 8-5 to get hose labs done. Advised to fast just so results can be as accurate as possible. She understood and was appreciative.  ?

## 2021-12-16 NOTE — Telephone Encounter (Signed)
She can get it done here. I put in an order for her

## 2021-12-23 ENCOUNTER — Other Ambulatory Visit (INDEPENDENT_AMBULATORY_CARE_PROVIDER_SITE_OTHER): Payer: Self-pay

## 2021-12-23 DIAGNOSIS — Z0289 Encounter for other administrative examinations: Secondary | ICD-10-CM

## 2021-12-23 DIAGNOSIS — R413 Other amnesia: Secondary | ICD-10-CM

## 2021-12-24 LAB — VITAMIN B12: Vitamin B-12: 803 pg/mL (ref 232–1245)

## 2021-12-28 NOTE — Progress Notes (Signed)
?Julie Boxer D.O. ?[facility] ?[place] ?Phone: [phone] ?Subjective:   ?I, Julie [name], am serving as a Education administrator for Dr. Hulan Saas. ?This visit occurred during the SARS-CoV-2 public health emergency.  Safety protocols were in place, including screening questions prior to the visit, additional usage of staff PPE, and extensive cleaning of exam room while observing appropriate contact time as indicated for disinfecting solutions.  ? ?I'm seeing this patient by the request  of:  Julie Hatchet, MD ? ?CC: back pain  ? ?[id number]  ?Julie [name] is a 81 y.o. female coming in with complaint of back pain. Last seen in 2021 for B knee pain. Patient states discomfort in hips. Wants to go over bone density results. The cause? Tart cherry? Scoliosis problem? Exercise? Prolia? Right knee doing well, but down hill is uncomfortable. ? ? ? ?  ? ?Past Medical History:  ?Diagnosis Date  ? Arthritis   ? Asthma   ? CAD in native artery 06/06/2017  ? Mild LAD disease on coronary CT-A 05/2017.  ? Cancer [facility])   ? skin  ? Celiac disease   ? Hyperlipidemia 05/11/2017  ? Hypertension   ? pt denies at preop  ? Hypothyroidism   ? Memory loss 10/15/2021  ? Polycythemia   ? Pure hypercholesterolemia 09/15/2020  ? 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 Left Post Arm(Dr. Dellis Filbert)  ? 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 Thyroid disease   ? TIA (transient ischemic attack)   ? 2017  ? Transaminitis 09/15/2020  ? ?Past Surgical History:  ?Procedure Laterality Date  ? ABDOMINAL HYSTERECTOMY    ? BLADDER SURGERY    ? hole in bladder from hysterectomy  ? BREAST SURGERY    ? biopsy breast benign  ? EYE SURGERY    ? bil cataract  ? TOTAL HIP ARTHROPLASTY Right 08/06/2019  ? Procedure: RIGHT TOTAL HIP ARTHROPLASTY ANTERIOR APPROACH;  Surgeon: Melrose Nakayama, MD;  Location: [facility];  Service: Orthopedics;  Laterality: Right;  ? TOTAL THYROIDECTOMY    ? ?Social History  ? ?Socioeconomic History  ? Marital  status: Soil scientist  ?  Spouse name: Julie [name]  ? Number of children: Not on file  ? Years of education: Not on file  ? Highest education level: Master's degree (e.g., MA, MS, MEng, MEd, MSW, MBA)  ?Occupational History  ? Not on file  ?Tobacco Use  ? Smoking status: Never  ? Smokeless tobacco: Never  ?Vaping Use  ? Vaping Use: Never used  ?Substance and Sexual Activity  ? Alcohol use: Yes  ?  Comment: 12/02/21 3 daily  ? Drug use: No  ? Sexual activity: Not Currently  ?Other Topics Concern  ? Not on file  ?Social History Narrative  ? Lives with partner  ? ?Social Determinants of Health  ? ?Financial Resource Strain: Not on file  ?Food Insecurity: Not on file  ?Transportation Needs: Not on file  ?Physical Activity: Not on file  ?Stress: Not on file  ?Social Connections: Not on file  ? ?Allergies  ?Allergen Reactions  ? Propantheline Rash  ?  Cannot remember other symptoms ?  ? Atorvastatin   ?  Myalgia   ? Gluten Meal Diarrhea  ?  bloating  ? Rosuvastatin   ? Sulfa Antibiotics Swelling  ? Zetia [Ezetimibe]   ?  Myalgias   ? Demerol [Meperidine] Rash  ? Morphine And Related Rash  ? ?Family History  ?Problem Relation Age of Onset  ?  Peripheral Artery Disease Mother   ? Dementia Father   ? ? ?Current Outpatient Medications (Endocrine & Metabolic):  ?  levothyroxine (SYNTHROID) 125 MCG tablet, Take 125 mcg by mouth daily before breakfast. ? ?Current Outpatient Medications (Cardiovascular):  ?  Evolocumab (REPATHA SURECLICK) 176 MG/ML SOAJ, Inject 140 mg into the skin every 14 (fourteen) days. ? ? ?Current Outpatient Medications (Analgesics):  ?  aspirin EC 81 MG tablet, Take 81 mg by mouth daily. Swallow whole. ? ? ?Current Outpatient Medications (Other):  ?  amoxicillin (AMOXIL) 500 MG tablet, Take 1,000 mg by mouth. Dental work only (Patient not taking: Reported on 12/02/2021) ?  cholecalciferol (VITAMIN D) 1000 units tablet, Take 1,000 Units by mouth daily. ?  famotidine (PEPCID) 40 MG tablet, Take by mouth as needed.  (Patient not taking: Reported on 12/02/2021) ?  ketoconazole (NIZORAL) 2 % shampoo, as needed. (Patient not taking: Reported on 12/02/2021) ?  Multiple Vitamins-Minerals (MULTIVITAMIN WITH MINERALS) tablet, Take 1 tablet by mouth daily. ?  Multiple Vitamins-Minerals (PRESERVISION AREDS 2 PO), Take 1 tablet by mouth daily. ? ? ?Reviewed prior external information including notes and imaging from  ?primary care provider ?As well as notes that were available from care everywhere and other healthcare systems. ? ?Past medical history, social, surgical and family history all reviewed in electronic medical record.  No pertanent information unless stated regarding to the chief complaint.  ? ?Review of Systems: ? No headache, visual changes, nausea, vomiting, diarrhea, constipation, dizziness, abdominal pain, skin rash, fevers, chills, night sweats, weight loss, swollen lymph nodes, joint swelling, chest pain, shortness of breath, mood changes. POSITIVE muscle aches, body aches ? ?Objective  ?Blood pressure 98/62, pulse 71, height 5' 5"  (1.651 m), weight 152 lb (68.9 kg), SpO2 98 %. ?  ?General: No apparent distress alert and oriented x3 mood and affect normal, dressed appropriately.  ?HEENT: Pupils equal, extraocular movements intact  ?Respiratory: Patient's speak in full sentences and does not appear short of breath  ?Cardiovascular: No lower extremity edema, non tender, no erythema  ?Gait mild antalgic  ?MSK: Back exam does have significant loss of lordosis.  Patient does have some degenerative scoliosis noted, tightness with FABER test bilaterally.  Neurovascular intact distally in the legs though noted.  Patient back pain does seem to be more tender to palpation especially on the midline more than usual.  Neurovascular intact distally but may be does have some atrophy of the lower extremities bilaterally ? ? ?  ?Impression and Recommendations:  ?  ? ?The above documentation has been reviewed and is accurate and complete  Lyndal Pulley, DO ? ? ? ?

## 2021-12-29 ENCOUNTER — Ambulatory Visit (INDEPENDENT_AMBULATORY_CARE_PROVIDER_SITE_OTHER): Payer: Medicare Other | Admitting: Family Medicine

## 2021-12-29 ENCOUNTER — Ambulatory Visit (INDEPENDENT_AMBULATORY_CARE_PROVIDER_SITE_OTHER): Payer: Medicare Other

## 2021-12-29 VITALS — BP 98/62 | HR 71 | Ht 65.0 in | Wt 152.0 lb

## 2021-12-29 DIAGNOSIS — M549 Dorsalgia, unspecified: Secondary | ICD-10-CM | POA: Diagnosis not present

## 2021-12-29 DIAGNOSIS — M48061 Spinal stenosis, lumbar region without neurogenic claudication: Secondary | ICD-10-CM

## 2021-12-29 DIAGNOSIS — M8588 Other specified disorders of bone density and structure, other site: Secondary | ICD-10-CM

## 2021-12-29 DIAGNOSIS — M8580 Other specified disorders of bone density and structure, unspecified site: Secondary | ICD-10-CM | POA: Insufficient documentation

## 2021-12-29 NOTE — Assessment & Plan Note (Signed)
New density scan shows the patient does osteopenia. Patient still having no some back pain as well.  Pain seems to be out of proportion to the amount of palpation.  We will get MRI to further evaluate and x-rays to rule out any type of compression fracture.  Patient was still is adamant that she would like to avoid different medications including the Fosamax which she has had previously.  Did discuss the benefits of Prolia compared to Reclast.  Encouraged her to discuss with either primary care or endocrinology.  Patient will consider it.  Follow-up with me again in 6 to 8 weeks ?

## 2021-12-29 NOTE — Patient Instructions (Addendum)
Xray today ?[facility] [phone] ?Call Today ? ?When we receive your results we will contact you. ? ?Look into [facility] ? ?

## 2021-12-29 NOTE — Assessment & Plan Note (Signed)
Patient is complaining of the signs and symptoms that is consistent with degenerative spinal stenosis.  We discussed icing regimen and home exercises, which activities to do and which ones to avoid, increase activity slowly.  We will follow-up again after imaging to discuss further. ?Total time discussing with patient, significant other and going over her bone density in greater detail and the pathology that I am anticipating 40 minutes ?

## 2021-12-30 LAB — COMPREHENSIVE METABOLIC PANEL
ALT: 20 IU/L (ref 0–32)
AST: 19 IU/L (ref 0–40)
Albumin/Globulin Ratio: 2.2 (ref 1.2–2.2)
Albumin: 3.9 g/dL (ref 3.7–4.7)
Alkaline Phosphatase: 82 IU/L (ref 44–121)
BUN/Creatinine Ratio: 15 (ref 12–28)
BUN: 12 mg/dL (ref 8–27)
Bilirubin Total: 0.7 mg/dL (ref 0.0–1.2)
CO2: 24 mmol/L (ref 20–29)
Calcium: 8.9 mg/dL (ref 8.7–10.3)
Chloride: 105 mmol/L (ref 96–106)
Creatinine, Ser: 0.81 mg/dL (ref 0.57–1.00)
Globulin, Total: 1.8 g/dL (ref 1.5–4.5)
Glucose: 91 mg/dL (ref 70–99)
Potassium: 4.6 mmol/L (ref 3.5–5.2)
Sodium: 142 mmol/L (ref 134–144)
Total Protein: 5.7 g/dL — ABNORMAL LOW (ref 6.0–8.5)
eGFR: 73 mL/min/{1.73_m2} (ref >59)

## 2021-12-30 LAB — LIPID PANEL
Chol/HDL Ratio: 1.4 ratio (ref 0.0–4.4)
Cholesterol, Total: 101 mg/dL (ref 100–199)
HDL: 70 mg/dL (ref >39)
LDL Chol Calc (NIH): 16 mg/dL (ref 0–99)
Triglycerides: 71 mg/dL (ref 0–149)
VLDL Cholesterol Cal: 15 mg/dL (ref 5–40)

## 2022-01-08 ENCOUNTER — Ambulatory Visit
Admission: RE | Admit: 2022-01-08 | Discharge: 2022-01-08 | Disposition: A | Discharge status: Home / Self Care | Payer: Medicare Other | Source: Ambulatory Visit | Attending: Family Medicine | Admitting: Family Medicine

## 2022-01-08 DIAGNOSIS — M549 Dorsalgia, unspecified: Secondary | ICD-10-CM

## 2022-01-10 ENCOUNTER — Encounter: Payer: Self-pay | Admitting: Family Medicine

## 2022-02-04 ENCOUNTER — Telehealth: Payer: Self-pay | Admitting: Family Medicine

## 2022-02-04 ENCOUNTER — Other Ambulatory Visit: Payer: Self-pay

## 2022-02-04 DIAGNOSIS — M5416 Radiculopathy, lumbar region: Secondary | ICD-10-CM

## 2022-02-04 NOTE — Telephone Encounter (Signed)
Patient called following up. Can the Epidural order be put in for her? I did not see the order in her chart.

## 2022-02-04 NOTE — Telephone Encounter (Signed)
error 

## 2022-02-10 ENCOUNTER — Ambulatory Visit
Admission: RE | Admit: 2022-02-10 | Discharge: 2022-02-10 | Disposition: A | Discharge status: Home / Self Care | Payer: Medicare Other | Source: Ambulatory Visit | Attending: Family Medicine | Admitting: Family Medicine

## 2022-02-10 DIAGNOSIS — M5416 Radiculopathy, lumbar region: Secondary | ICD-10-CM

## 2022-02-10 MED ORDER — METHYLPREDNISOLONE ACETATE 40 MG/ML INJ SUSP (RADIOLOG
80.0000 mg | Freq: Once | INTRAMUSCULAR | Status: AC
  Administered 2022-02-10: 80 mg via EPIDURAL

## 2022-02-10 MED ORDER — IOPAMIDOL (ISOVUE-M 200) INJECTION 41%
1.0000 mL | Freq: Once | INTRAMUSCULAR | Status: AC
  Administered 2022-02-10: 1 mL via EPIDURAL

## 2022-02-10 NOTE — Discharge Instructions (Signed)

## 2022-07-05 NOTE — Progress Notes (Unsigned)
Cardiology Office Note:    Date:  07/07/2022   ID:  Malachy Chamber, DOB 19-Jun-1941, MRN [id number]  PCP:  Velna Hatchet, MD   [facility] Cardiologist:  Skeet Latch, MD Cardiology APP:  Ledora Bottcher, PA { Referring MD: Velna Hatchet, MD   Chief Complaint  Patient presents with   Follow-up    HTN, HLD    History of Present Illness:    Julie [name] is a 81 y.o. female with a hx of nonobstructive CAD, hypothyroidism, mild COPD, history of TIA, hyperlipidemia, celiac disease, and skin cancer.  She establish care 05/2017 for evaluation of an abnormal EKG.  More specifically, she was noted to have T wave inversions in the anterolateral leads.  She had an episode of chest pain 03/2017 that occurred in the setting of a stressful event.  She was referred for a coronary CTA which showed mild plaque in the proximal LAD and D1, there was no evidence of obstructive disease.  Her lipid profile was not controlled and her atorvastatin was increased.  She did have improvement with her and her LDL but developed myalgias.  Atorvastatin reduced back to her previous dose and Zetia added.  Continue to struggle with myalgias and subsequently found to have elevated LFTs.  LFTs continue to rise despite cessation of statin use and she was diagnosed with fatty liver disease.  She was seen by Dr. Oval Linsey 10/15/2021 and was doing well but was struggling with memory issues.  She was referred to neurology.  She presents today for routine cardiology follow-up. She is here with Julie [name].  She is upset about fluctuating BP.  BP log she shows me has readings taken at all different times of the day.   She is very active - she does deep water aerobics three times per week. She walks the dog and plays golf with golf cart. They eat a good diet, she has celiac disease.   She questions the need for continued repatha. We reviewed her CT coronary and history of TIA along with significant side effects of  three times weekly statin (LFTs, myalgias).   Past Medical History:  Diagnosis Date   Arthritis    Asthma    CAD in native artery 06/06/2017   Mild LAD disease on coronary CT-A 05/2017.   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   Thyroid disease    TIA (transient ischemic attack)    2017   Transaminitis 09/15/2020    Past Surgical History:  Procedure Laterality Date   ABDOMINAL HYSTERECTOMY     BLADDER SURGERY     hole in bladder from hysterectomy   BREAST SURGERY     biopsy breast benign   EYE SURGERY     bil cataract   TOTAL HIP ARTHROPLASTY Right 08/06/2019   Procedure: RIGHT TOTAL HIP ARTHROPLASTY ANTERIOR APPROACH;  Surgeon: Melrose Nakayama, MD;  Location: [facility];  Service: Orthopedics;  Laterality: Right;   TOTAL THYROIDECTOMY      Current Medications: Current Meds  Medication Sig   amoxicillin (AMOXIL) 500 MG tablet Take 1,000 mg by mouth. Dental work only   aspirin EC 81 MG tablet Take  81 mg by mouth daily. Swallow whole.   cholecalciferol (VITAMIN D) 1000 units tablet Take 1,000 Units by mouth daily.   Evolocumab (REPATHA SURECLICK) 284 MG/ML SOAJ Inject 140 mg into the skin every 14 (fourteen) days.   famotidine (PEPCID) 40 MG tablet Take by mouth as needed.   levothyroxine (SYNTHROID) 125 MCG tablet Take 125 mcg by mouth daily before breakfast.   losartan (COZAAR) 25 MG tablet Take 1 tablet (25 mg total) by mouth every evening.   Multiple Vitamins-Minerals (MULTIVITAMIN WITH MINERALS) tablet Take 1 tablet by mouth daily.   Multiple Vitamins-Minerals (PRESERVISION AREDS 2 PO) Take 1 tablet by mouth daily.     Allergies:   Propantheline, Atorvastatin, Gluten meal, Rosuvastatin, Sulfa  antibiotics, Zetia [ezetimibe], Demerol [meperidine], and Morphine and related   Social History   Socioeconomic History   Marital status: Soil scientist    Spouse name: Julie [name]   Number of children: Not on file   Years of education: Not on file   Highest education level: Master's degree (e.g., MA, MS, MEng, MEd, MSW, MBA)  Occupational History   Not on file  Tobacco Use   Smoking status: Never   Smokeless tobacco: Never  Vaping Use   Vaping Use: Never used  Substance and Sexual Activity   Alcohol use: Yes    Comment: 12/02/21 3 daily   Drug use: No   Sexual activity: Not Currently  Other Topics Concern   Not on file  Social History Narrative   Lives with partner   Social Determinants of Health   Financial Resource Strain: Not on file  Food Insecurity: Not on file  Transportation Needs: Not on file  Physical Activity: Not on file  Stress: Not on file  Social Connections: Not on file     Family History: The patient's family history includes Dementia in her father; Peripheral Artery Disease in her mother.  ROS:   Please see the history of present illness.     All other systems reviewed and are negative.  EKGs/Labs/Other Studies Reviewed:    The following studies were reviewed today:  CT coronary 2018 IMPRESSION: 1.  Nonobstructive coronary disease.   2.  No calcium score sequence was done.  EKG:  EKG is not ordered today.    Recent Labs: 12/29/2021: ALT 20; BUN 12; Creatinine, Ser 0.81; Potassium 4.6; Sodium 142  Recent Lipid Panel    Component Value Date/Time   CHOL 101 12/29/2021 0802   TRIG 71 12/29/2021 0802   HDL 70 12/29/2021 0802   CHOLHDL 1.4 12/29/2021 0802   CHOLHDL 4.8 10/05/2015 1349   VLDL 44 (H) 10/05/2015 1349   LDLCALC 16 12/29/2021 0802     Risk Assessment/Calculations:                Physical Exam:    VS:  BP 132/82 (BP Location: Left Arm, Patient Position: Sitting, Cuff Size: Normal)   Pulse 69   Ht 5' 5"  (1.651 m)   Wt  153 lb 12.8 oz (69.8 kg)   SpO2 97%   BMI 25.59 kg/m     Wt Readings from Last 3 Encounters:  07/07/22 153 lb 12.8 oz (69.8 kg)  12/29/21 152 lb (68.9 kg)  12/02/21 152 lb 6.4 oz (69.1 kg)     GEN:  Well nourished, well developed in no acute distress HEENT: Normal NECK: No JVD; No carotid bruits LYMPHATICS: No lymphadenopathy CARDIAC: RRR, no murmurs, rubs, gallops RESPIRATORY:  Clear to auscultation without rales, wheezing or rhonchi  ABDOMEN: Soft, non-tender, non-distended MUSCULOSKELETAL:  No edema; No deformity  SKIN: Warm and dry NEUROLOGIC:  Alert and oriented x 3 PSYCHIATRIC:  Normal affect   ASSESSMENT:    1. Primary hypertension   2. Dyslipidemia, goal LDL below 70   3. Statin myopathy   4. CAD in native artery   5. Transaminitis   6. Fatty liver   7. TIA (transient ischemic attack)   8. Memory loss    PLAN:    In order of problems listed above:  Hypertension BP now much more labile, but readings at all different times SBP 110-150s Will start a low dose of losartan 25 mg nightly with BMP in 2 weeks She will keep a BP log at 8AM - after breakfast and before water aerobics   Nonobstructive CAD No chest pain Continue repatha and ASA She remains active   Hyperlipidemia with LDL goal less than 70 Nonalcoholic fatty liver disease Started on Repatha with excellent control of her LDL She did not tolerate statins due to myalgias 12/29/2021: Cholesterol, Total 101; HDL 70; LDL Chol Calc (NIH) 16; Triglycerides 71   Memory loss Hx of TIA Referred to neurology at last visit.    Follow up in 6 weeks with BP log.   Medication Adjustments/Labs and Tests Ordered: Current medicines are reviewed at length with the patient today.  Concerns regarding medicines are outlined above.  Orders Placed This Encounter  Procedures   Basic metabolic panel   Meds ordered this encounter  Medications   losartan (COZAAR) 25 MG tablet    Sig: Take 1 tablet (25 mg  total) by mouth every evening.    Dispense:  30 tablet    Refill:  2    There are no Patient Instructions on file for this visit.   Signed, Ledora Bottcher, [place]  07/07/2022 9:33 AM    [place]

## 2022-07-07 ENCOUNTER — Encounter: Payer: Self-pay | Admitting: Physician Assistant

## 2022-07-07 ENCOUNTER — Ambulatory Visit: Payer: Medicare Other | Attending: Physician Assistant | Admitting: Physician Assistant

## 2022-07-07 ENCOUNTER — Other Ambulatory Visit: Payer: Self-pay

## 2022-07-07 ENCOUNTER — Telehealth (HOSPITAL_BASED_OUTPATIENT_CLINIC_OR_DEPARTMENT_OTHER): Payer: Self-pay | Admitting: Cardiovascular Disease

## 2022-07-07 ENCOUNTER — Emergency Department (HOSPITAL_BASED_OUTPATIENT_CLINIC_OR_DEPARTMENT_OTHER)
Admission: EM | Admit: 2022-07-07 | Discharge: 2022-07-08 | Disposition: A | Discharge status: Home / Self Care | Payer: Medicare Other | Attending: Emergency Medicine | Admitting: Emergency Medicine

## 2022-07-07 ENCOUNTER — Encounter (HOSPITAL_BASED_OUTPATIENT_CLINIC_OR_DEPARTMENT_OTHER): Payer: Self-pay

## 2022-07-07 VITALS — BP 132/82 | HR 69 | Ht 65.0 in | Wt 153.8 lb

## 2022-07-07 DIAGNOSIS — E785 Hyperlipidemia, unspecified: Secondary | ICD-10-CM

## 2022-07-07 DIAGNOSIS — G459 Transient cerebral ischemic attack, unspecified: Secondary | ICD-10-CM

## 2022-07-07 DIAGNOSIS — Z7982 Long term (current) use of aspirin: Secondary | ICD-10-CM | POA: Insufficient documentation

## 2022-07-07 DIAGNOSIS — K760 Fatty (change of) liver, not elsewhere classified: Secondary | ICD-10-CM

## 2022-07-07 DIAGNOSIS — G720 Drug-induced myopathy: Secondary | ICD-10-CM

## 2022-07-07 DIAGNOSIS — T466X5D Adverse effect of antihyperlipidemic and antiarteriosclerotic drugs, subsequent encounter: Secondary | ICD-10-CM

## 2022-07-07 DIAGNOSIS — T782XXA Anaphylactic shock, unspecified, initial encounter: Secondary | ICD-10-CM | POA: Diagnosis not present

## 2022-07-07 DIAGNOSIS — I2510 Atherosclerotic heart disease of native coronary artery without angina pectoris: Secondary | ICD-10-CM | POA: Diagnosis not present

## 2022-07-07 DIAGNOSIS — I10 Essential (primary) hypertension: Secondary | ICD-10-CM

## 2022-07-07 DIAGNOSIS — T7840XA Allergy, unspecified, initial encounter: Secondary | ICD-10-CM | POA: Diagnosis present

## 2022-07-07 DIAGNOSIS — R7401 Elevation of levels of liver transaminase levels: Secondary | ICD-10-CM

## 2022-07-07 DIAGNOSIS — R413 Other amnesia: Secondary | ICD-10-CM

## 2022-07-07 MED ORDER — LOSARTAN POTASSIUM 25 MG PO TABS: 25.0000 mg | ORAL_TABLET | Freq: Every evening | ORAL | 2 refills | Status: DC

## 2022-07-07 MED ORDER — METHYLPREDNISOLONE SODIUM SUCC 125 MG IJ SOLR
125.0000 mg | Freq: Once | INTRAMUSCULAR | Status: AC
  Administered 2022-07-07: 125 mg via INTRAVENOUS
  Filled 2022-07-07: qty 2

## 2022-07-07 MED ORDER — DIPHENHYDRAMINE HCL 25 MG PO TABS: 25.0000 mg | ORAL_TABLET | Freq: Three times a day (TID) | ORAL | 0 refills | Status: AC | PRN

## 2022-07-07 MED ORDER — PREDNISONE 10 MG PO TABS: 20.0000 mg | ORAL_TABLET | Freq: Every day | ORAL | 0 refills | Status: AC

## 2022-07-07 MED ORDER — HYDROXYZINE HCL 25 MG PO TABS
25.0000 mg | ORAL_TABLET | Freq: Once | ORAL | Status: AC
  Administered 2022-07-07: 25 mg via ORAL
  Filled 2022-07-07: qty 1

## 2022-07-07 MED ORDER — FAMOTIDINE IN NACL 20-0.9 MG/50ML-% IV SOLN
20.0000 mg | Freq: Once | INTRAVENOUS | Status: AC
  Administered 2022-07-07: 20 mg via INTRAVENOUS
  Filled 2022-07-07: qty 50

## 2022-07-07 MED ORDER — REPATHA SURECLICK 140 MG/ML ~~LOC~~ SOAJ: 140.0000 mg | SUBCUTANEOUS | 12 refills | Status: DC

## 2022-07-07 MED ORDER — EPINEPHRINE 0.3 MG/0.3ML IJ SOAJ
0.3000 mg | Freq: Once | INTRAMUSCULAR | Status: AC
  Administered 2022-07-07: 0.3 mg via INTRAMUSCULAR
  Filled 2022-07-07: qty 0.3

## 2022-07-07 NOTE — ED Triage Notes (Signed)
Patient here POV from Home.  Endorses taking Losartan for the first time today at 2030 and began having generalized itching and Oral Swelling at 2100.   Given 50 mg of Benadryl at 2130.   NAD Noted during Triage. A&Ox4. GCS 15. Ambulatory.

## 2022-07-07 NOTE — Telephone Encounter (Signed)
Patient states she is having trouble with getting her medication. Please advise

## 2022-07-07 NOTE — ED Provider Notes (Signed)
Care assumed from Dr. Langston Masker, patient with allergic reaction to losartan. She will need to be reassessed.  2:23 AM Patient feels much better.  Findings feels like it is back to normal.  She is felt to be safe for discharge.  I am adding a prescription for epinephrine autoinjector to use as needed.  She will need to contact her cardiologist to get a prescription for a medication to take in place of the losartan.  Return precautions discussed.   Delora Fuel, MD 85/99/23 [phone]

## 2022-07-07 NOTE — Patient Instructions (Addendum)
Medication Instructions:  Start Losartan 25 mg ( Take 1 Tablet In The Evening ). *If you need a refill on your cardiac medications before your next appointment, please call your pharmacy*   Lab Work: BMET   In 2 Weeks. If you have labs (blood work) drawn today and your tests are completely normal, you will receive your results only by: [facility] (if you have MyChart) OR A paper copy in the mail If you have any lab test that is abnormal or we need to change your treatment, we will call you to review the results.   Testing/Procedures: No Testing   Follow-Up: At [facility], you and your health needs are our priority.  As part of our continuing mission to provide you with exceptional heart care, we have created designated Provider Care Teams.  These Care Teams include your primary Cardiologist (physician) and Advanced Practice Providers (APPs -  Physician Assistants and Nurse Practitioners) who all work together to provide you with the care you need, when you need it.  We recommend signing up for the patient portal called "MyChart".  Sign up information is provided on this After Visit Summary.  MyChart is used to connect with patients for Virtual Visits (Telemedicine).  Patients are able to view lab/test results, encounter notes, upcoming appointments, etc.  Non-urgent messages can be sent to your provider as well.   To learn more about what you can do with MyChart, go to [email or web address].    Your next appointment:   4-6 week(s)  The format for your next appointment:   In Person  Provider:   Fabian Sharp, PA-C

## 2022-07-07 NOTE — Discharge Instructions (Addendum)
Please stop taking losartan.  This medication likely cause a severe allergic reaction.  You need to call your doctor's office to discuss switching to a different medication..  You may take diphenhydramine (Benadryl) every 4-6 hours, as needed.  Return if you are having any problems.

## 2022-07-07 NOTE — ED Provider Notes (Signed)
[facility] EMERGENCY DEPT Provider Note   CSN: [id number] Arrival date & time: 07/07/22  2218     History  Chief Complaint  Patient presents with   Allergic Reaction    Julie [name] is a 81 y.o. female presenting emerged part with concern for allergic reaction.  The patient reports she took losartan for the first time this evening.  Within an hour of taking the medication and began to feel nauseated, developed an itchy rash on her palms spread over her entire body, and has began having tongue swelling.  She took 50 mg of Benadryl at home with her family member, prior to coming to the ED.  She denies prior history of allergic reaction or anaphylaxis or angioedema.  She has not taken losartan before.  HPI     Home Medications Prior to Admission medications   Medication Sig Start Date End Date Taking? Authorizing Provider  diphenhydrAMINE (BENADRYL) 25 MG tablet Take 1 tablet (25 mg total) by mouth every 8 (eight) hours as needed for up to 21 doses for allergies or sleep. 07/07/22  Yes Eowyn Tabone, Carola Rhine, MD  predniSONE (DELTASONE) 10 MG tablet Take 2 tablets (20 mg total) by mouth daily with breakfast for 5 days. 07/08/22 07/13/22 Yes Amjad Fikes, Carola Rhine, MD  amoxicillin (AMOXIL) 500 MG tablet Take 1,000 mg by mouth. Dental work only 07/27/20   [provider]  aspirin EC 81 MG tablet Take 81 mg by mouth daily. Swallow whole.    [provider]  cholecalciferol (VITAMIN D) 1000 units tablet Take 1,000 Units by mouth daily.    [provider]  Evolocumab (REPATHA SURECLICK) 767 MG/ML SOAJ Inject 140 mg into the skin every 14 (fourteen) days. 07/07/22   [facility], Tami Lin, PA  famotidine (PEPCID) 40 MG tablet Take by mouth as needed. 07/21/20   [provider]  ketoconazole (NIZORAL) 2 % shampoo as needed. Patient not taking: Reported on 07/07/2022 08/20/20   [provider]  levothyroxine (SYNTHROID) 125 MCG tablet Take 125 mcg by  mouth daily before breakfast.    [provider]  Multiple Vitamins-Minerals (MULTIVITAMIN WITH MINERALS) tablet Take 1 tablet by mouth daily.    [provider]  Multiple Vitamins-Minerals (PRESERVISION AREDS 2 PO) Take 1 tablet by mouth daily.    [provider]      Allergies    Losartan, Propantheline, Atorvastatin, Gluten meal, Rosuvastatin, Sulfa antibiotics, Zetia [ezetimibe], Demerol [meperidine], and Morphine and related    Review of Systems   Review of Systems  Physical Exam Updated Vital Signs BP 91/74 (BP Location: Right Arm)   Pulse 81   Temp (!) 96.9 F (36.1 C) (Temporal)   Resp 18   Ht 5' 5"  (1.651 m)   Wt 69.8 kg   SpO2 96%   BMI 25.61 kg/m  Physical Exam Constitutional:      General: She is not in acute distress. HENT:     Head: Normocephalic and atraumatic.     Comments: There is mild swelling of the tongue Oropharynx non-erythematous.  No tonsillar swelling or exudate.  No uvular deviation.  No drooling. No brawny edema. No stridor. Voice is not muffled.   Eyes:     Conjunctiva/sclera: Conjunctivae normal.     Pupils: Pupils are equal, round, and reactive to light.  Cardiovascular:     Rate and Rhythm: Normal rate and regular rhythm.  Pulmonary:     Effort: Pulmonary effort is normal. No respiratory distress.  Abdominal:  General: There is no distension.     Tenderness: There is no abdominal tenderness.  Skin:    General: Skin is warm and dry.     Comments: Small hives scattered across the arms, chest, abdomen, legs  Neurological:     General: No focal deficit present.     Mental Status: She is alert. Mental status is at baseline.  Psychiatric:        Mood and Affect: Mood normal.        Behavior: Behavior normal.     ED Results / Procedures / Treatments   Labs (all labs ordered are listed, but only abnormal results are displayed) Labs Reviewed - No data to display  EKG EKG  Interpretation  Date/Time:  Thursday July 07 2022 22:31:50 EDT Ventricular Rate:  68 PR Interval:  161 QRS Duration: 103 QT Interval:  381 QTC Calculation: 406 R Axis:   -56 Text Interpretation: Sinus rhythm Left anterior fascicular block Low voltage, precordial leads Consider anterior infarct Confirmed by Octaviano Glow [phone]) on 07/07/2022 10:59:15 PM  Radiology No results found.  Procedures .Critical Care  Performed by: Wyvonnia Dusky, MD Authorized by: Wyvonnia Dusky, MD   Critical care provider statement:    Critical care time (minutes):  35   Critical care time was exclusive of:  Separately billable procedures and treating other patients   Critical care was necessary to treat or prevent imminent or life-threatening deterioration of the following conditions: anaphylaxis.   Critical care was time spent personally by me on the following activities:  Ordering and performing treatments and interventions, ordering and review of laboratory studies, ordering and review of radiographic studies, pulse oximetry, review of old charts, examination of patient and evaluation of patient's response to treatment Comments:     Management of anaphylaxis     Medications Ordered in ED Medications  famotidine (PEPCID) IVPB 20 mg premix (20 mg Intravenous New Bag/Given 07/07/22 2305)  EPINEPHrine (EPI-PEN) injection 0.3 mg (0.3 mg Intramuscular Given 07/07/22 2256)  methylPREDNISolone sodium succinate (SOLU-MEDROL) 125 mg/2 mL injection 125 mg (125 mg Intravenous Given 07/07/22 2258)  hydrOXYzine (ATARAX) tablet 25 mg (25 mg Oral Given 07/07/22 2258)    ED Course/ Medical Decision Making/ A&P Clinical Course as of 07/07/22 2332  Thu Jul 07, 2022  2259 Patient signed out to Dr Roxanne Mins EDP pending reevaluation after medications, if improved or stable anticipate discharge home. [MT]    Clinical Course User Index [MT] Maksim Peregoy, Carola Rhine, MD                           Medical Decision  Making Risk Prescription drug management.   Patient is here with an allergic reaction, diffuse hives and plus airway involvement, which qualifies for anaphylaxis.  This is likely triggered by losartan which she took for the first time this evening.  She does have some mild tongue swelling, no other evidence of impending airway compromise.  She is not requiring intubation at this time.  She took Benadryl ready at home.  I will give steroids, intramuscular epinephrine, Atarax for itching, as well as IV Pepcid.  We will monitor her in the ED to ensure that her symptoms are improving.  She does feel ready that the itching is improving in her palms and forearms and she took the Benadryl.  I have instructed her clearly not to take the losartan anymore.  She verbalized understanding.  Her EKG per my interpretation shows  normal sinus rhythm with no acute ischemic findings.        Final Clinical Impression(s) / ED Diagnoses Final diagnoses:  Anaphylaxis, initial encounter    Rx / DC Orders ED Discharge Orders          Ordered    predniSONE (DELTASONE) 10 MG tablet  Daily with breakfast        07/07/22 2316    diphenhydrAMINE (BENADRYL) 25 MG tablet  Every 8 hours PRN        07/07/22 2316              Wyvonnia Dusky, MD 07/07/22 2332

## 2022-07-08 ENCOUNTER — Telehealth (HOSPITAL_BASED_OUTPATIENT_CLINIC_OR_DEPARTMENT_OTHER): Payer: Self-pay | Admitting: *Deleted

## 2022-07-08 MED ORDER — EPINEPHRINE 0.3 MG/0.3ML IJ SOAJ: 0.3000 mg | INTRAMUSCULAR | 0 refills | Status: DC | PRN

## 2022-07-08 NOTE — Telephone Encounter (Signed)
Called patient left message on personal voice mail to call back.

## 2022-07-08 NOTE — Telephone Encounter (Signed)
Received PA request for Repatha, has been started through [employer]

## 2022-07-12 ENCOUNTER — Encounter (HOSPITAL_BASED_OUTPATIENT_CLINIC_OR_DEPARTMENT_OTHER): Payer: Self-pay | Admitting: Cardiovascular Disease

## 2022-07-12 NOTE — Telephone Encounter (Signed)
Patient last seen by Fabian Sharp, PA. Routing to appropriate triage basket

## 2022-07-13 NOTE — Telephone Encounter (Signed)
PA for repatha was completed.

## 2022-08-02 ENCOUNTER — Ambulatory Visit (INDEPENDENT_AMBULATORY_CARE_PROVIDER_SITE_OTHER): Payer: Medicare Other | Admitting: Family Medicine

## 2022-08-02 ENCOUNTER — Ambulatory Visit: Payer: Self-pay

## 2022-08-02 ENCOUNTER — Encounter: Payer: Self-pay | Admitting: Family Medicine

## 2022-08-02 ENCOUNTER — Ambulatory Visit (INDEPENDENT_AMBULATORY_CARE_PROVIDER_SITE_OTHER): Payer: Medicare Other

## 2022-08-02 VITALS — BP 122/62 | HR 64 | Ht 65.0 in | Wt 156.0 lb

## 2022-08-02 DIAGNOSIS — M25562 Pain in left knee: Secondary | ICD-10-CM | POA: Diagnosis not present

## 2022-08-02 NOTE — Progress Notes (Signed)
[place] Phone: [phone] Subjective:   Julie [name], am serving as a scribe for Dr. Hulan Saas.  I'm seeing this patient by the request  of:  Velna Hatchet, MD  CC: left knee pain   DXI:[id number]  Julie [name] is a 81 y.o. female coming in with complaint of L knee pain. Patient states that she walked down some steps and slipped but stayed standing. Developed pain in posterior aspect of knee. Patient has tried Aleve. Painful to walk but is ok with water aerobics.   Also continues to have R knee pain. Wears brace for support.       Past Medical History:  Diagnosis Date   Arthritis    Asthma    CAD in native artery 06/06/2017   Mild LAD disease on coronary CT-A 05/2017.   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   Thyroid disease    TIA (transient ischemic attack)    2017   Transaminitis 09/15/2020   Past Surgical History:  Procedure Laterality Date   ABDOMINAL HYSTERECTOMY     BLADDER SURGERY     hole in bladder from hysterectomy   BREAST SURGERY     biopsy breast benign   EYE SURGERY     bil cataract   TOTAL HIP ARTHROPLASTY Right 08/06/2019   Procedure: RIGHT TOTAL HIP ARTHROPLASTY ANTERIOR APPROACH;  Surgeon: Melrose Nakayama, MD;  Location: [facility];  Service: Orthopedics;  Laterality: Right;   TOTAL THYROIDECTOMY     Social History   Socioeconomic History   Marital status: Soil scientist    Spouse name: Arbie Cookey   Number of children: Not on file   Years of education: Not on file   Highest education level: Master's degree (e.g., MA, MS, MEng, MEd, MSW, MBA)  Occupational History   Not on file  Tobacco Use    Smoking status: Never   Smokeless tobacco: Never  Vaping Use   Vaping Use: Never used  Substance and Sexual Activity   Alcohol use: Yes    Comment: 12/02/21 3 daily   Drug use: [name]   Sexual activity: Not Currently  Other Topics Concern   Not on file  Social History Narrative   Lives with partner   Social Determinants of Health   Financial Resource Strain: Not on file  Food Insecurity: Not on file  Transportation Needs: Not on file  Physical Activity: Not on file  Stress: Not on file  Social Connections: Not on file   Allergies  Allergen Reactions   Losartan Anaphylaxis    Diffuse hives and tongue swelling   Propantheline Rash    Cannot remember other symptoms    Atorvastatin     Myalgia    Gluten Meal Diarrhea    bloating   Rosuvastatin    Sulfa Antibiotics Swelling   Zetia [Ezetimibe]     Myalgias    Demerol [Meperidine] Rash   Morphine And Related Rash   Family History  Problem Relation Age of Onset   Peripheral Artery Disease Mother    Dementia Father     Current Outpatient Medications (Endocrine & Metabolic):  levothyroxine (SYNTHROID) 125 MCG tablet, Take 125 mcg by mouth daily before breakfast.  Current Outpatient Medications (Cardiovascular):    EPINEPHrine 0.3 mg/0.3 mL IJ SOAJ injection, Inject 0.3 mg into the muscle as needed for anaphylaxis.   Evolocumab (REPATHA SURECLICK) 115 MG/ML SOAJ, Inject 140 mg into the skin every 14 (fourteen) days.  Current Outpatient Medications (Respiratory):    diphenhydrAMINE (BENADRYL) 25 MG tablet, Take 1 tablet (25 mg total) by mouth every 8 (eight) hours as needed for up to 21 doses for allergies or sleep.  Current Outpatient Medications (Analgesics):    aspirin EC 81 MG tablet, Take 81 mg by mouth daily. Swallow whole.   Current Outpatient Medications (Other):    cholecalciferol (VITAMIN D) 1000 units tablet, Take 1,000 Units by mouth daily.   famotidine (PEPCID) 40 MG tablet, Take by mouth as needed.    Multiple Vitamins-Minerals (MULTIVITAMIN WITH MINERALS) tablet, Take 1 tablet by mouth daily.   Multiple Vitamins-Minerals (PRESERVISION AREDS 2 PO), Take 1 tablet by mouth daily.   Reviewed prior external information including notes and imaging from  primary care provider As well as notes that were available from care everywhere and other healthcare systems.  Past medical history, social, surgical and family history all reviewed in electronic medical record.  [name] pertanent information unless stated regarding to the chief complaint.   Review of Systems:  [name] headache, visual changes, nausea, vomiting, diarrhea, constipation, dizziness, abdominal pain, skin rash, fevers, chills, night sweats, weight loss, swollen lymph nodes, body aches, joint swelling, chest pain, shortness of breath, mood changes. POSITIVE muscle aches  Objective  Blood pressure 122/62, pulse 64, height 5' 5"  (1.651 m), weight 156 lb (70.8 kg), SpO2 96 %.   General: [name] apparent distress alert and oriented x3 mood and affect normal, dressed appropriately.  HEENT: Pupils equal, extraocular movements intact  Respiratory: Patient's speak in full sentences and does not appear short of breath  Cardiovascular: [name] lower extremity edema, non tender, [name] erythema  Left knee trace effusion noted mild laxity of the LCL noted. [name] crepitus noted.  Tender to palpation over the lateral joint line  Limited muscular skeletal ultrasound was performed and interpreted by Hulan Saas, M  Limited ultrasound shows that there is [name] significant effusion noted or hypoechoic changes of the patellofemoral joint.  Patient does have hypoechoic changes appears to be a partial tear of the LCL.  Mild gapping noted with dynamic testing.  Meniscus appears to be unremarkable. Impression: LCL partial tear    Impression and Recommendations:    The above documentation has been reviewed and is accurate and complete Lyndal Pulley, DO   The above  documentation has been reviewed and is accurate and complete Lyndal Pulley, DO

## 2022-08-02 NOTE — Assessment & Plan Note (Signed)
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 LCL tear with fluid.  Discussed with patient to avoid lateral motions.  Okay to do water aerobics which patient has been doing most of the time now secondary to arthritic changes. Hinge brace return to clinic in 6 to 8 weeks otherwise.  Patient's goal is to be playing golf at the end of January.

## 2022-08-02 NOTE — Patient Instructions (Signed)
Exercises Wear brace for support See me again in 6-8 weeks

## 2022-08-14 NOTE — Progress Notes (Deleted)
Cardiology Office Note:    Date:  08/14/2022   ID:  Julie [name], DOB 19-Mar-1941, MRN [id number]  PCP:  Velna Hatchet, MD   [facility] Cardiologist:  Skeet Latch, MD Cardiology APP:  Ledora Bottcher, PA { Click to update primary MD,subspecialty MD or APP then REFRESH:1}    Referring MD: Velna Hatchet, MD   No chief complaint on file. ***  History of Present Illness:    Julie [name] is a 81 y.o. female with a hx of nonobstructive CAD, hypothyroidism, mild COPD, history of TIA, hyperlipidemia, celiac disease, and skin cancer.  She establish care 05/2017 for evaluation of an abnormal EKG.  More specifically, she was noted to have T wave inversions in the anterolateral leads.  She had an episode of chest pain 03/2017 that occurred in the setting of a stressful event.  She was referred for a coronary CTA which showed mild plaque in the proximal LAD and D1, there was no evidence of obstructive disease.  Her lipid profile was not controlled and her atorvastatin was increased.  She did have improvement in LDL but developed myalgias.  Atorvastatin reduced back to her previous dose and Zetia added.  Continues to struggle with myalgias and subsequently found to have elevated LFTs.  LFTs continue to rise despite cessation of statin use and she was diagnosed with fatty liver disease.  She was seen by Dr. Oval Linsey 10/15/2021 and was doing well but was struggling with memory issues.  She was referred to neurology.  I saw her in follow up 07/07/22. She remained active doing water aerobics. BP was more labile and I opted to start a low dose of losartan. Unfortunately, she had an allergic reaction with the first dose of losartan including itching and oral swelling. This was added to her list of allergies. She did not have a prior history of angioedema. Pt was upset and stated she is allergic to sulfa. Losartan does not contain sulfa. Risk of angioedema to losartan is about 0.1%. I  reviewed this with the patient.     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          Past Medical History:  Diagnosis Date   Arthritis    Asthma    CAD in native artery 06/06/2017   Mild LAD disease on coronary CT-A 05/2017.   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   Thyroid disease    TIA (transient ischemic attack)    2017   Transaminitis 09/15/2020    Past Surgical History:  Procedure Laterality Date   ABDOMINAL HYSTERECTOMY     BLADDER SURGERY     hole in bladder from hysterectomy   BREAST SURGERY     biopsy breast benign  EYE SURGERY     bil cataract   TOTAL HIP ARTHROPLASTY Right 08/06/2019   Procedure: RIGHT TOTAL HIP ARTHROPLASTY ANTERIOR APPROACH;  Surgeon: Melrose Nakayama, MD;  Location: [facility];  Service: Orthopedics;  Laterality: Right;   TOTAL THYROIDECTOMY      Current Medications: No outpatient medications have been marked as taking for the 08/18/22 encounter (Appointment) with Ledora Bottcher, [place].     Allergies:   Losartan, Propantheline, Atorvastatin, Gluten meal, Rosuvastatin, Sulfa antibiotics, Zetia [ezetimibe], Demerol [meperidine], and Morphine and related   Social History   Socioeconomic History   Marital status: Soil scientist    Spouse name:  Arbie Cookey   Number of children: Not on file   Years of education: Not on file   Highest education level: Master's degree (e.g., MA, MS, MEng, MEd, MSW, MBA)  Occupational History   Not on file  Tobacco Use   Smoking status: Never   Smokeless tobacco: Never  Vaping Use   Vaping Use: Never used  Substance and Sexual Activity   Alcohol use: Yes    Comment: 12/02/21 3 daily   Drug use: No   Sexual activity: Not Currently  Other Topics Concern   Not on file  Social History Narrative   Lives with partner   Social Determinants of Health   Financial Resource Strain: Not on file  Food Insecurity: Not on file  Transportation Needs: Not on file  Physical Activity: Not on file  Stress: Not on file  Social Connections: Not on file     Family History: The patient's ***family history includes Dementia in her father; Peripheral Artery Disease in her mother.  ROS:   Please see the history of present illness.    *** All other systems reviewed and are negative.  EKGs/Labs/Other Studies Reviewed:    The following studies were reviewed today: ***  EKG:  EKG is *** ordered today.  The ekg ordered today demonstrates ***  Recent Labs: 12/29/2021: ALT 20; BUN 12; Creatinine, Ser 0.81; Potassium 4.6; Sodium 142  Recent Lipid Panel    Component Value Date/Time   CHOL 101 12/29/2021 0802   TRIG 71 12/29/2021 0802   HDL 70 12/29/2021 0802   CHOLHDL 1.4 12/29/2021 0802   CHOLHDL 4.8 10/05/2015 1349   VLDL 44 (H) 10/05/2015 1349   LDLCALC 16 12/29/2021 0802     Risk Assessment/Calculations:   {Does this patient have ATRIAL FIBRILLATION?:[phone]}  No BP recorded.  {Refresh Note OR Click here to enter BP  :1}***         Physical Exam:    VS:  There were no vitals taken for this visit.    Wt Readings from Last 3 Encounters:  08/02/22 156 lb (70.8 kg)  07/07/22 153 lb 14.1 oz (69.8 kg)  07/07/22 153 lb 12.8 oz (69.8 kg)     GEN: *** Well nourished, well developed in no acute  distress HEENT: Normal NECK: No JVD; No carotid bruits LYMPHATICS: No lymphadenopathy CARDIAC: ***RRR, no murmurs, rubs, gallops RESPIRATORY:  Clear to auscultation without rales, wheezing or rhonchi  ABDOMEN: Soft, non-tender, non-distended MUSCULOSKELETAL:  No edema; No deformity  SKIN: Warm and dry NEUROLOGIC:  Alert and oriented x 3 PSYCHIATRIC:  Normal affect   ASSESSMENT:    No diagnosis found. PLAN:    In order of problems listed above:  ***      {Are you ordering a CV Procedure (e.g. stress test, cath, DCCV, TEE, etc)?   Press F2        :  [id number]}    Medication Adjustments/Labs and Tests Ordered: Current medicines are reviewed at length with the patient today.  Concerns regarding medicines are outlined above.  No orders of the defined types were placed in this encounter.  No orders of the defined types were placed in this encounter.   There are no Patient Instructions on file for this visit.   Signed, Ledora Bottcher, PA  08/14/2022 1:36 PM    [facility]

## 2022-08-18 ENCOUNTER — Ambulatory Visit: Payer: Medicare Other | Admitting: Physician Assistant

## 2022-09-01 ENCOUNTER — Ambulatory Visit: Payer: Medicare Other | Admitting: Family Medicine

## 2022-09-06 NOTE — Progress Notes (Signed)
[place] Phone: [phone] Subjective:   Julie [name], am serving as a scribe for Dr. Hulan Saas.  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 LCL tear with fluid.  Discussed with patient to avoid lateral motions.  Okay to do water aerobics which patient has been doing most of the time now secondary to arthritic changes. Hinge brace return to clinic in 6 to 8 weeks otherwise.  Patient's goal is to be playing golf at the end of January      Update 09/13/2021 Julie [name] is a 82 y.o. female coming in with complaint of B knee pain. L>R. Patient states that she has pain going down the stairs. Does water aerobics 3x a week without pain.  Patient states though that is still having difficulty walking for a long amount of time.  Describes the pain as a dull, throbbing aching sensation.      Past Medical History:  Diagnosis Date   Arthritis    Asthma    CAD in native artery 06/06/2017   Mild LAD disease on coronary CT-A 05/2017.   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   Thyroid disease    TIA (transient ischemic attack)    2017   Transaminitis 09/15/2020   Past Surgical History:  Procedure Laterality Date   ABDOMINAL HYSTERECTOMY     BLADDER SURGERY     hole in bladder from hysterectomy   BREAST SURGERY     biopsy breast benign   EYE SURGERY     bil cataract   TOTAL HIP ARTHROPLASTY Right 08/06/2019   Procedure: RIGHT TOTAL HIP ARTHROPLASTY  ANTERIOR APPROACH;  Surgeon: Melrose Nakayama, MD;  Location: [facility];  Service: Orthopedics;  Laterality: Right;   TOTAL THYROIDECTOMY     Social History   Socioeconomic History   Marital status: Soil scientist    Spouse name: Arbie Cookey   Number of children: Not on file   Years of education: Not on file   Highest education level: Master's degree (e.g., MA, MS, MEng, MEd, MSW, MBA)  Occupational History   Not on file  Tobacco Use   Smoking status: Never   Smokeless tobacco: Never  Vaping Use   Vaping Use: Never used  Substance and Sexual Activity   Alcohol use: Yes    Comment: 12/02/21 3 daily   Drug use: [name]   Sexual activity: Not Currently  Other Topics Concern   Not on file  Social History Narrative   Lives with partner   Social Determinants of Health   Financial Resource Strain: Not on file  Food Insecurity: Not on file  Transportation Needs: Not on file  Physical Activity: Not on file  Stress: Not on file  Social Connections: Not on file   Allergies  Allergen Reactions   Losartan Anaphylaxis    Diffuse hives and tongue swelling   Propantheline Rash    Cannot  remember other symptoms    Atorvastatin     Myalgia    Gluten Meal Diarrhea    bloating   Rosuvastatin    Sulfa Antibiotics Swelling   Zetia [Ezetimibe]     Myalgias    Demerol [Meperidine] Rash   Morphine And Related Rash   Family History  Problem Relation Age of Onset   Peripheral Artery Disease Mother    Dementia Father     Current Outpatient Medications (Endocrine & Metabolic):    levothyroxine (SYNTHROID) 125 MCG tablet, Take 125 mcg by mouth daily before breakfast.  Current Outpatient Medications (Cardiovascular):    EPINEPHrine 0.3 mg/0.3 mL IJ SOAJ injection, Inject 0.3 mg into the muscle as needed for anaphylaxis.   Evolocumab (REPATHA SURECLICK) 081 MG/ML SOAJ, Inject 140 mg into the skin every 14 (fourteen) days.  Current Outpatient Medications (Respiratory):    diphenhydrAMINE  (BENADRYL) 25 MG tablet, Take 1 tablet (25 mg total) by mouth every 8 (eight) hours as needed for up to 21 doses for allergies or sleep.  Current Outpatient Medications (Analgesics):    aspirin EC 81 MG tablet, Take 81 mg by mouth daily. Swallow whole.   Current Outpatient Medications (Other):    cholecalciferol (VITAMIN D) 1000 units tablet, Take 1,000 Units by mouth daily.   famotidine (PEPCID) 40 MG tablet, Take by mouth as needed.   Multiple Vitamins-Minerals (MULTIVITAMIN WITH MINERALS) tablet, Take 1 tablet by mouth daily.   Multiple Vitamins-Minerals (PRESERVISION AREDS 2 PO), Take 1 tablet by mouth daily.   Reviewed prior external information including notes and imaging from  primary care provider As well as notes that were available from care everywhere and other healthcare systems.  Past medical history, social, surgical and family history all reviewed in electronic medical record.  [name] pertanent information unless stated regarding to the chief complaint.   Review of Systems:  [name] headache, visual changes, nausea, vomiting, diarrhea, constipation, dizziness, abdominal pain, skin rash, fevers, chills, night sweats, weight loss, swollen lymph nodes, body aches, joint swelling, chest pain, shortness of breath, mood changes. POSITIVE muscle aches  Objective  Blood pressure 124/84, pulse 86, height '5\' 5"'$  (1.651 m), weight 157 lb (71.2 kg), SpO2 98 %.   General: [name] apparent distress alert and oriented x3 mood and affect normal, dressed appropriately.  HEENT: Pupils equal, extraocular movements intact  Respiratory: Patient's speak in full sentences and does not appear short of breath  Cardiovascular: [name] lower extremity edema, non tender, [name] erythema  Patient does have crepitus of the knees bilaterally.  [name] gapping noted on the lateral aspect of the knee.  Patient has negative McMurray's.  [name] significant lateral tracking of the patella bilaterally.  After informed written and verbal  consent, patient was seated on exam table. Right knee was prepped with alcohol swab and utilizing anterolateral approach, patient's right knee space was injected with 4:1  marcaine 0.5%: Kenalog '40mg'$ /dL. Patient tolerated the procedure well without immediate complications.  After informed written and verbal consent, patient was seated on exam table. Left knee was prepped with alcohol swab and utilizing anterolateral approach, patient's left knee space was injected with 4:1  marcaine 0.5%: Kenalog '40mg'$ /dL. Patient tolerated the procedure well without immediate complications.    Impression and Recommendations:     The above documentation has been reviewed and is accurate and complete Lyndal Pulley, DO

## 2022-09-13 ENCOUNTER — Ambulatory Visit (INDEPENDENT_AMBULATORY_CARE_PROVIDER_SITE_OTHER): Payer: Medicare Other | Admitting: Family Medicine

## 2022-09-13 VITALS — BP 124/84 | HR 86 | Ht 65.0 in | Wt 157.0 lb

## 2022-09-13 DIAGNOSIS — M170 Bilateral primary osteoarthritis of knee: Secondary | ICD-10-CM

## 2022-09-13 NOTE — Assessment & Plan Note (Signed)
Bilateral patellofemoral arthritic changes.  Discussed icing regimen and home exercises, which activities to do and which ones to avoid.  Increase activity slowly.  Follow-up again in 6 to 8 weeks

## 2022-09-13 NOTE — Patient Instructions (Addendum)
Good to see you Injected both knees today Stay active Enjoy Fl  See me in 8 weeks

## 2022-09-16 ENCOUNTER — Telehealth: Payer: Self-pay | Admitting: *Deleted

## 2022-09-16 NOTE — Telephone Encounter (Signed)
-----  Message from Jacqualin Combes sent at 09/13/2022  9:27 AM EST ----- Regarding: visco Can you please run patient for B visco injections?  Thanks

## 2022-09-16 NOTE — Telephone Encounter (Signed)
Bilateral visco initiated through portal.

## 2022-09-22 NOTE — Telephone Encounter (Signed)
Bilateral Durolane approved.  Specialist office visits, Durolane [phone] and procedures 20610/20611 are covered at 80% of the allowable. Deductible must be met before coverage applies. If out of pocket is met, coverage goes to 100%.

## 2022-09-26 ENCOUNTER — Other Ambulatory Visit (HOSPITAL_BASED_OUTPATIENT_CLINIC_OR_DEPARTMENT_OTHER): Payer: Self-pay | Admitting: Cardiovascular Disease

## 2022-11-03 NOTE — Progress Notes (Signed)
[place] Phone: [phone] Subjective:   Julie [name], am serving as a scribe for Dr. Hulan Saas. I'm seeing this patient by the request  of:  Velna Hatchet, MD  CC: Bilateral knees do have some crepitus noted.  [id number]  09/13/2022 Bilateral patellofemoral arthritic changes.  Discussed icing regimen and home exercises, which activities to do and which ones to avoid.  Increase activity slowly.  Follow-up again in 6 to 8 weeks   Update 11/09/2022 Julie [name] is a 82 y.o. female coming in with complaint of B knee pain. Patient states that her L knee is doing much better. Pain R knee persists. Ok if she is walking on flat surfaces but has pain with stairs and declines.     Past Medical History:  Diagnosis Date   Arthritis    Asthma    CAD in native artery 06/06/2017   Mild LAD disease on coronary CT-A 05/2017.   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   Thyroid disease    TIA (transient ischemic attack)    2017   Transaminitis 09/15/2020   Past Surgical History:  Procedure Laterality Date   ABDOMINAL HYSTERECTOMY     BLADDER SURGERY     hole in bladder from hysterectomy   BREAST SURGERY     biopsy breast benign   EYE SURGERY     bil cataract   TOTAL HIP ARTHROPLASTY Right 08/06/2019   Procedure: RIGHT TOTAL HIP ARTHROPLASTY ANTERIOR APPROACH;  Surgeon: Melrose Nakayama, MD;  Location: [facility];  Service: Orthopedics;  Laterality: Right;   TOTAL THYROIDECTOMY     Social History   Socioeconomic History   Marital status: Soil scientist    Spouse name: Arbie Cookey   Number of children: Not on file   Years of education: Not on  file   Highest education level: Master's degree (e.g., MA, MS, MEng, MEd, MSW, MBA)  Occupational History   Not on file  Tobacco Use   Smoking status: Never   Smokeless tobacco: Never  Vaping Use   Vaping Use: Never used  Substance and Sexual Activity   Alcohol use: Yes    Comment: 12/02/21 3 daily   Drug use: [name]   Sexual activity: Not Currently  Other Topics Concern   Not on file  Social History Narrative   Lives with partner   Social Determinants of Health   Financial Resource Strain: Not on file  Food Insecurity: Not on file  Transportation Needs: Not on file  Physical Activity: Not on file  Stress: Not on file  Social Connections: Not on file   Allergies  Allergen Reactions   Losartan Anaphylaxis    Diffuse hives and tongue swelling   Propantheline Rash    Cannot remember other symptoms    Atorvastatin     Myalgia    Gluten Meal Diarrhea    bloating   Rosuvastatin    Sulfa Antibiotics Swelling   Zetia [Ezetimibe]     Myalgias    Demerol [Meperidine] Rash   Morphine And Related Rash   Family History  Problem Relation Age of Onset  Peripheral Artery Disease Mother    Dementia Father     Current Outpatient Medications (Endocrine & Metabolic):    levothyroxine (SYNTHROID) 125 MCG tablet, Take 125 mcg by mouth daily before breakfast.  Current Outpatient Medications (Cardiovascular):    Evolocumab (REPATHA SURECLICK) [id number] MG/ML SOAJ, INJECT 140 MG INTO THE SKIN EVERY 14 (FOURTEEN) DAYS.  Current Outpatient Medications (Respiratory):    diphenhydrAMINE (BENADRYL) 25 MG tablet, Take 1 tablet (25 mg total) by mouth every 8 (eight) hours as needed for up to 21 doses for allergies or sleep.  Current Outpatient Medications (Analgesics):    aspirin EC 81 MG tablet, Take 81 mg by mouth daily. Swallow whole.   Current Outpatient Medications (Other):    cholecalciferol (VITAMIN D) 1000 units tablet, Take 1,000 Units by mouth daily.   Multiple Vitamins-Minerals  (MULTIVITAMIN WITH MINERALS) tablet, Take 1 tablet by mouth daily.   Multiple Vitamins-Minerals (PRESERVISION AREDS 2 PO), Take 1 tablet by mouth daily.    Objective  Blood pressure 110/72, pulse 73, height '5\' 5"'$  (1.651 m), weight 166 lb (75.3 kg), SpO2 98 %.   General: [name] apparent distress alert and oriented x3 mood and affect normal, dressed appropriately.  HEENT: Pupils equal, extraocular movements intact  Respiratory: Patient's speak in full sentences and does not appear short of breath  Cardiovascular: [name] lower extremity edema, non tender, [name] erythema  Right knee still has a small effusion noted.  Some mild instability noted with valgus and varus force.    Impression and Recommendations:    The above documentation has been reviewed and is accurate and complete Lyndal Pulley, DO

## 2022-11-08 ENCOUNTER — Ambulatory Visit (INDEPENDENT_AMBULATORY_CARE_PROVIDER_SITE_OTHER): Payer: Medicare Other | Admitting: Family Medicine

## 2022-11-08 VITALS — BP 110/72 | HR 73 | Ht 65.0 in | Wt 166.0 lb

## 2022-11-08 DIAGNOSIS — M170 Bilateral primary osteoarthritis of knee: Secondary | ICD-10-CM

## 2022-11-08 NOTE — Patient Instructions (Signed)
Great to see you Glad you are well See me in 2 months

## 2022-11-08 NOTE — Assessment & Plan Note (Signed)
Seems stable at this time.  No other changes in management.  Discussed icing regimen and home exercises, follow-up again in 6 to 8 weeks otherwise.

## 2023-01-17 NOTE — Progress Notes (Signed)
[name] [address] [place] [address] Phone: [phone] Subjective:   Julie [name], am serving as a scribe for Dr. Antoine Primas.  I'm seeing this patient by the request  of:  Alysia Penna, MD  CC: right   [id number]  11/08/2022 Seems stable at this time.  No other changes in management.  Discussed icing regimen and home exercises, follow-up again in 6 to 8 weeks otherwise.    Update 01/24/2023 Julie [name] is a 82 y.o. female coming in with complaint of B knee pain. Patient states went on trip and did a lot of stairs and entire R leg was swollen. Back is also feeling bad. Pain is sporadic and causing discomfort.     Past Medical History:  Diagnosis Date   Arthritis    Asthma    CAD in native artery 06/06/2017   Mild LAD disease on coronary CT-A 05/2017.   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   Procedure: RIGHT TOTAL HIP ARTHROPLASTY ANTERIOR APPROACH;  Surgeon: Marcene Corning, MD;  Location: [facility];  Service: Orthopedics;  Laterality: Right;   TOTAL THYROIDECTOMY     Social History   Socioeconomic History   Marital status: Media planner    Spouse name: Okey Regal   Number of children: Not on file   Years of education: Not on file   Highest education level: Master's degree (e.g., MA, MS, MEng, MEd, MSW, MBA)   Occupational History   Not on file  Tobacco Use   Smoking status: Never   Smokeless tobacco: Never  Vaping Use   Vaping Use: Never used  Substance and Sexual Activity   Alcohol use: Yes    Comment: 12/02/21 3 daily   Drug use: No   Sexual activity: Not Currently  Other Topics Concern   Not on file  Social History Narrative   Lives with partner   Social Determinants of Health   Financial Resource Strain: Not on file  Food Insecurity: Not on file  Transportation Needs: Not on file  Physical Activity: Not on file  Stress: Not on file  Social Connections: Not on file   Allergies  Allergen Reactions   Losartan Anaphylaxis    Diffuse hives and tongue swelling   Propantheline Rash    Cannot remember other symptoms    Atorvastatin     Myalgia    Gluten Meal Diarrhea    bloating   Rosuvastatin    Sulfa Antibiotics Swelling   Zetia [Ezetimibe]     Myalgias    Demerol [Meperidine] Rash   Morphine And Codeine Rash   Family History  Problem Relation Age of Onset   Peripheral Artery Disease Mother    Dementia Father  Current Outpatient Medications (Endocrine & Metabolic):    levothyroxine (SYNTHROID) 125 MCG tablet, Take 125 mcg by mouth daily before breakfast.  Current Outpatient Medications (Cardiovascular):    Evolocumab (REPATHA SURECLICK) 140 MG/ML SOAJ, INJECT 140 MG INTO THE SKIN EVERY 14 (FOURTEEN) DAYS.  Current Outpatient Medications (Respiratory):    diphenhydrAMINE (BENADRYL) 25 MG tablet, Take 1 tablet (25 mg total) by mouth every 8 (eight) hours as needed for up to 21 doses for allergies or sleep.  Current Outpatient Medications (Analgesics):    aspirin EC 81 MG tablet, Take 81 mg by mouth daily. Swallow whole.   Current Outpatient Medications (Other):    cholecalciferol (VITAMIN D) 1000 units tablet, Take 1,000 Units by mouth daily.   molnupiravir EUA (LAGEVRIO) 200 mg CAPS capsule, Take 4 capsules (800 mg total) by mouth 2 (two) times daily for 5  days.   Multiple Vitamins-Minerals (MULTIVITAMIN WITH MINERALS) tablet, Take 1 tablet by mouth daily.   Multiple Vitamins-Minerals (PRESERVISION AREDS 2 PO), Take 1 tablet by mouth daily.   Reviewed prior external information including notes and imaging from  primary care provider As well as notes that were available from care everywhere and other healthcare systems.  Past medical history, social, surgical and family history all reviewed in electronic medical record.  No pertanent information unless stated regarding to the chief complaint.   Review of Systems:  No headache, visual changes, nausea, vomiting, diarrhea, constipation, dizziness, abdominal pain, skin rash, fevers, chills, night sweats, weight loss, swollen lymph nodes, chest pain, shortness of breath, mood changes. POSITIVE muscle aches, body aches, joint swelling   Objective  Blood pressure 126/72, pulse (!) 56, height 5\' 5"  (1.651 m), weight 152 lb (68.9 kg), SpO2 99 %.   General: No apparent distress alert and oriented x3 mood and affect normal, dressed appropriately.  HEENT: Pupils equal, extraocular movements intact  Respiratory: Patient's speak in full sentences and does not appear short of breath  Cardiovascular: No lower extremity edema, non tender, no erythema  Bilateral knees do have crepitus noted.  Lateral tracking of the patella noted.  Does have swelling and actually noted over the right patellofemoral joint.  After informed written and verbal consent, patient was seated on exam table. Right knee was prepped with alcohol swab and utilizing anterolateral approach, patient's right knee space was injected with 4:1  marcaine 0.5%: Kenalog 40mg /dL. Patient tolerated the procedure well without immediate complications.  After informed written and verbal consent, patient was seated on exam table. Left knee was prepped with alcohol swab and utilizing anterolateral approach, patient's left knee space was injected with 4:1   marcaine 0.5%: Kenalog 40mg /dL. Patient tolerated the procedure well without immediate complications.    Impression and Recommendations:    The above documentation has been reviewed and is accurate and complete Judi Saa, DO

## 2023-01-22 ENCOUNTER — Telehealth: Payer: Medicare Other | Admitting: Nurse Practitioner

## 2023-01-22 DIAGNOSIS — U071 COVID-19: Secondary | ICD-10-CM | POA: Diagnosis not present

## 2023-01-22 MED ORDER — MOLNUPIRAVIR EUA 200MG CAPSULE: 4.0000 | ORAL_CAPSULE | Freq: Two times a day (BID) | ORAL | 0 refills | Status: AC

## 2023-01-22 NOTE — Patient Instructions (Signed)
Julie [name], thank you for joining Claiborne Rigg, NP for today's virtual visit.  While this provider is not your primary care provider (PCP), if your PCP is located in our provider database this encounter information will be shared with them immediately following your visit.   A [facility] MyChart account gives you access to today's visit and all your visits, tests, and labs performed at [facility] " click here if you don't have a [facility] MyChart account or go to [email or web address]  Consent: (Patient) Julie [name] provided verbal consent for this virtual visit at the beginning of the encounter.  Current Medications:  Current Outpatient Medications:    molnupiravir EUA (LAGEVRIO) 200 mg CAPS capsule, Take 4 capsules (800 mg total) by mouth 2 (two) times daily for 5 days., Disp: 40 capsule, Rfl: 0   aspirin EC 81 MG tablet, Take 81 mg by mouth daily. Swallow whole., Disp: , Rfl:    cholecalciferol (VITAMIN D) 1000 units tablet, Take 1,000 Units by mouth daily., Disp: , Rfl:    diphenhydrAMINE (BENADRYL) 25 MG tablet, Take 1 tablet (25 mg total) by mouth every 8 (eight) hours as needed for up to 21 doses for allergies or sleep., Disp: 21 tablet, Rfl: 0   Evolocumab (REPATHA SURECLICK) 140 MG/ML SOAJ, INJECT 140 MG INTO THE SKIN EVERY 14 (FOURTEEN) DAYS., Disp: 2 mL, Rfl: 11   levothyroxine (SYNTHROID) 125 MCG tablet, Take 125 mcg by mouth daily before breakfast., Disp: , Rfl:    Multiple Vitamins-Minerals (MULTIVITAMIN WITH MINERALS) tablet, Take 1 tablet by mouth daily., Disp: , Rfl:    Multiple Vitamins-Minerals (PRESERVISION AREDS 2 PO), Take 1 tablet by mouth daily., Disp: , Rfl:    Medications ordered in this encounter:  Meds ordered this encounter  Medications   molnupiravir EUA (LAGEVRIO) 200 mg CAPS capsule    Sig: Take 4 capsules (800 mg total) by mouth 2 (two) times daily for 5 days.    Dispense:  40 capsule    Refill:  0    Order Specific Question:    Supervising Provider    Answer:   Merrilee Jansky [id number]     *If you need refills on other medications prior to your next appointment, please contact your pharmacy*  Follow-Up: Call back or seek an in-person evaluation if the symptoms worsen or if the condition fails to improve as anticipated.  [facility] [phone]  Other Instructions INSTRUCTIONS: use a humidifier for nasal congestion Drink plenty of fluids, rest and wash hands frequently to avoid the spread of infection Alternate tylenol and Motrin for relief of fever   Please keep well-hydrated and get plenty of rest. Start a saline nasal rinse to flush out your nasal passages. You can use plain Mucinex to help thin congestion. If you have a humidifier, you can use this daily as needed.    You are to wear a mask for 5 days from onset of your symptoms.  After day 5, if you have had no fever and you are feeling better with NO symptoms, you can end masking. Keep in mind you can be contagious 10 days from the onset of symptoms  After day 5 if you have a fever or are having significant symptoms, please wear your mask for full 10 days.   If you note any worsening of symptoms, any significant shortness of breath or any chest pain, please seek ER evaluation ASAP.  Please do not delay care!  If you note any worsening of symptoms, any significant shortness of breath or any chest pain, please seek ER evaluation ASAP.  Please do not delay care!    If you have been instructed to have an in-person evaluation today at a local Urgent Care facility, please use the link below. It will take you to a list of all of our available [facility], including address, phone number and hours of operation. Please do not delay care.  [facility]  If you or a family member do not have a primary care provider, use the link below to schedule a visit and establish care. When you choose a [facility] primary care  physician or advanced practice provider, you gain a long-term partner in health. Find a Primary Care Provider  Learn more about [facility]'s in-office and virtual care options:  - Get Care Now

## 2023-01-22 NOTE — Progress Notes (Signed)
Virtual Visit Consent   [name], you are scheduled for a virtual visit with a [facility] provider today. Just as with appointments in the office, your consent must be obtained to participate. Your consent will be active for this visit and any virtual visit you may have with one of our providers in the next 365 days. If you have a MyChart account, a copy of this consent can be sent to you electronically.  As this is a virtual visit, video technology does not allow for your provider to perform a traditional examination. This may limit your provider's ability to fully assess your condition. If your provider identifies any concerns that need to be evaluated in person or the need to arrange testing (such as labs, EKG, etc.), we will make arrangements to do so. Although advances in technology are sophisticated, we cannot ensure that it will always work on either your end or our end. If the connection with a video visit is poor, the visit may have to be switched to a telephone visit. With either a video or telephone visit, we are not always able to ensure that we have a secure connection.  By engaging in this virtual visit, you consent to the provision of healthcare and authorize for your insurance to be billed (if applicable) for the services provided during this visit. Depending on your insurance coverage, you may receive a charge related to this service.  I need to obtain your verbal consent now. Are you willing to proceed with your visit today? Julie [name] has provided verbal consent on 01/22/2023 for a virtual visit (video or telephone). Claiborne Rigg, NP  Date: 01/22/2023 12:52 PM  Virtual Visit via Video Note   I, Claiborne Rigg, connected with  Julie [name]  ([id number], [birth year]) on 01/22/23 at 12:45 PM EDT by a video-enabled telemedicine application and verified that I am speaking with the correct person using two identifiers.  Location: Patient: Virtual Visit Location Patient:  Home Provider: Virtual Visit Location Provider: Home Office   I discussed the limitations of evaluation and management by telemedicine and the availability of in person appointments. The patient expressed understanding and agreed to proceed.    History of Present Illness: Julie [name] is a 82 y.o. who identifies as a female who was assigned female at birth, and is being seen today for COVID POSITIVE.  Ms. Tooley recently returned from a trip and was told that 6 people she had been traveling with were COVID positive. Yesterday she began to experience headache, nasal congestion, cough and feels like she has a cold. She is requesting antiviral today. Denies fever, shortness of breath or chest pain.   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   Pre-operative clearance 07/31/2019   Arthritis of right hip 07/16/2019   Right hip pain 06/27/2019   CAD in native artery 06/06/2017   Dyslipidemia, goal LDL below 70 05/11/2017   CMC arthritis, thumb, 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   Closed fracture of middle third of scaphoid bone of left wrist 03/31/2016   Lateral meniscal tear 01/25/2016   Patellofemoral arthralgia of right knee 01/25/2016   TIA (transient  ischemic attack) 10/05/2015   Polycythemia 10/05/2015   Diplopia 10/05/2015   Thyroid disease    Celiac disease     Allergies:  Allergies  Allergen Reactions   Losartan Anaphylaxis    Diffuse hives and tongue swelling   Propantheline Rash    Cannot remember other symptoms    Atorvastatin     Myalgia    Gluten Meal Diarrhea    bloating   Rosuvastatin    Sulfa  Antibiotics Swelling   Zetia [Ezetimibe]     Myalgias    Demerol [Meperidine] Rash   Morphine And Codeine Rash   Medications:  Current Outpatient Medications:    molnupiravir EUA (LAGEVRIO) 200 mg CAPS capsule, Take 4 capsules (800 mg total) by mouth 2 (two) times daily for 5 days., Disp: 40 capsule, Rfl: 0   aspirin EC 81 MG tablet, Take 81 mg by mouth daily. Swallow whole., Disp: , Rfl:    cholecalciferol (VITAMIN D) 1000 units tablet, Take 1,000 Units by mouth daily., Disp: , Rfl:    diphenhydrAMINE (BENADRYL) 25 MG tablet, Take 1 tablet (25 mg total) by mouth every 8 (eight) hours as needed for up to 21 doses for allergies or sleep., Disp: 21 tablet, Rfl: 0   Evolocumab (REPATHA SURECLICK) 140 MG/ML SOAJ, INJECT 140 MG INTO THE SKIN EVERY 14 (FOURTEEN) DAYS., Disp: 2 mL, Rfl: 11   levothyroxine (SYNTHROID) 125 MCG tablet, Take 125 mcg by mouth daily before breakfast., Disp: , Rfl:    Multiple Vitamins-Minerals (MULTIVITAMIN WITH MINERALS) tablet, Take 1 tablet by mouth daily., Disp: , Rfl:    Multiple Vitamins-Minerals (PRESERVISION AREDS 2 PO), Take 1 tablet by mouth daily., Disp: , Rfl:   Observations/Objective: Patient is well-developed, well-nourished in no acute distress.  Resting comfortably at home.  Head is normocephalic, atraumatic.  No labored breathing.  Speech is clear and coherent with logical content.  Patient is alert and oriented at baseline.    Assessment and Plan: 1. Positive self-administered antigen test for COVID-19 - molnupiravir EUA (LAGEVRIO) 200 mg CAPS capsule; Take 4 capsules (800 mg total) by mouth 2 (two) times daily for 5 days.  Dispense: 40 capsule; Refill: 0  INSTRUCTIONS: use a humidifier for nasal congestion Drink plenty of fluids, rest and wash hands frequently to avoid the spread of infection Alternate tylenol and Motrin for relief of fever   Follow Up Instructions: I discussed the assessment and treatment plan with the patient. The patient  was provided an opportunity to ask questions and all were answered. The patient agreed with the plan and demonstrated an understanding of the instructions.  A copy of instructions were sent to the patient via MyChart unless otherwise noted below.    The patient was advised to call back or seek an in-person evaluation if the symptoms worsen or if the condition fails to improve as anticipated.  Time:  I spent 11 minutes with the patient via telehealth technology discussing the above problems/concerns.    Claiborne Rigg, NP

## 2023-01-24 ENCOUNTER — Ambulatory Visit (INDEPENDENT_AMBULATORY_CARE_PROVIDER_SITE_OTHER): Payer: Medicare Other | Admitting: Family Medicine

## 2023-01-24 ENCOUNTER — Encounter: Payer: Self-pay | Admitting: Family Medicine

## 2023-01-24 VITALS — BP 126/72 | HR 56 | Ht 65.0 in | Wt 152.0 lb

## 2023-01-24 DIAGNOSIS — M170 Bilateral primary osteoarthritis of knee: Secondary | ICD-10-CM

## 2023-01-24 NOTE — Assessment & Plan Note (Signed)
Chronic problem with worsening symptoms.  Discussed icing regimen and home exercises, discussed which activities to do and which ones to avoid.  Discussed that patient could be a potential candidate for viscosupplementation if needed.  Patient will follow up with me again in 6 to 8 weeks

## 2023-01-24 NOTE — Patient Instructions (Signed)
Injection in knees today No mercy on golf course If back worsen give [place] a call See you again in 7-8 weeks

## 2023-03-08 NOTE — Progress Notes (Signed)
[name] [address] [place] [address] Phone: [phone] Subjective:   [name], am serving as a scribe for Dr. Antoine Primas.  I'm seeing this patient by the request  of:  Alysia Penna, MD  CC: bilateral knee pain   [id number]  01/24/2023 Chronic problem with worsening symptoms.  Discussed icing regimen and home exercises, discussed which activities to do and which ones to avoid.  Discussed that patient could be a potential candidate for viscosupplementation if needed.  Patient will follow up with me again in 6 to 8 weeks    Update 03/16/2023 TU FERENCE is a 82 y.o. female coming in with complaint of B knee pain. Patient states L knee doing well. R knee swollen. Hurts when going down stairs and hills. Beginning of golf hear and feel crunching and then everything is fine.       Past Medical History:  Diagnosis Date   Arthritis    Asthma    CAD in native artery 06/06/2017   Mild LAD disease on coronary CT-A 05/2017.   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   Procedure: RIGHT TOTAL HIP ARTHROPLASTY ANTERIOR APPROACH;  Surgeon: Marcene Corning, MD;  Location: [facility];  Service: Orthopedics;  Laterality: Right;   TOTAL THYROIDECTOMY     Social History   Socioeconomic History   Marital status: Media planner     Spouse name: Okey Regal   Number of children: Not on file   Years of education: Not on file   Highest education level: Master's degree (e.g., MA, MS, MEng, MEd, MSW, MBA)  Occupational History   Not on file  Tobacco Use   Smoking status: Never   Smokeless tobacco: Never  Vaping Use   Vaping status: Never Used  Substance and Sexual Activity   Alcohol use: Yes    Comment: 12/02/21 3 daily   Drug use: No   Sexual activity: Not Currently  Other Topics Concern   Not on file  Social History Narrative   Lives with partner   Social Determinants of Health   Financial Resource Strain: Not on file  Food Insecurity: Not on file  Transportation Needs: Not on file  Physical Activity: Not on file  Stress: Not on file  Social Connections: Not on file   Allergies  Allergen Reactions   Losartan Anaphylaxis    Diffuse hives and tongue swelling   Propantheline Rash    Cannot remember other symptoms    Atorvastatin     Myalgia    Gluten Meal Diarrhea    bloating   Rosuvastatin    Sulfa Antibiotics Swelling   Zetia [Ezetimibe]     Myalgias    Demerol [Meperidine] Rash  Morphine And Codeine Rash   Family History  Problem Relation Age of Onset   Peripheral Artery Disease Mother    Dementia Father     Current Outpatient Medications (Endocrine & Metabolic):    levothyroxine (SYNTHROID) 125 MCG tablet, Take 125 mcg by mouth daily before breakfast.  Current Outpatient Medications (Cardiovascular):    Evolocumab (REPATHA SURECLICK) 140 MG/ML SOAJ, INJECT 140 MG INTO THE SKIN EVERY 14 (FOURTEEN) DAYS.  Current Outpatient Medications (Respiratory):    diphenhydrAMINE (BENADRYL) 25 MG tablet, Take 1 tablet (25 mg total) by mouth every 8 (eight) hours as needed for up to 21 doses for allergies or sleep.  Current Outpatient Medications (Analgesics):    aspirin EC 81 MG tablet, Take 81 mg by mouth daily. Swallow whole.   Current Outpatient Medications (Other):    cholecalciferol (VITAMIN  D) 1000 units tablet, Take 1,000 Units by mouth daily.   Multiple Vitamins-Minerals (MULTIVITAMIN WITH MINERALS) tablet, Take 1 tablet by mouth daily.   Multiple Vitamins-Minerals (PRESERVISION AREDS 2 PO), Take 1 tablet by mouth daily.   Reviewed prior external information including notes and imaging from  primary care provider As well as notes that were available from care everywhere and other healthcare systems.  Past medical history, social, surgical and family history all reviewed in electronic medical record.  No pertanent information unless stated regarding to the chief complaint.   Review of Systems:  No headache, visual changes, nausea, vomiting, diarrhea, constipation, dizziness, abdominal pain, skin rash, fevers, chills, night sweats, weight loss, swollen lymph nodes, body aches, joint swelling, chest pain, shortness of breath, mood changes. POSITIVE muscle aches  Objective  Blood pressure 112/66, pulse 71, height 5\' 5"  (1.651 m), weight 148 lb (67.1 kg), SpO2 98%.   General: No apparent distress alert and oriented x3 mood and affect normal, dressed appropriately.  HEENT: Pupils equal, extraocular movements intact  Respiratory: Patient's speak in full sentences and does not appear short of breath  Cardiovascular: No lower extremity edema, non tender, no erythema  Bilateral knee exam shows bilateral mild crepitus noted.  Arthritic changes noted.  Tender to palpation on exam.    Impression and Recommendations:     The above documentation has been reviewed and is accurate and complete Judi Saa, DO

## 2023-03-16 ENCOUNTER — Encounter: Payer: Self-pay | Admitting: Family Medicine

## 2023-03-16 ENCOUNTER — Ambulatory Visit: Payer: Medicare Other | Admitting: Family Medicine

## 2023-03-16 VITALS — BP 112/66 | HR 71 | Ht 65.0 in | Wt 148.0 lb

## 2023-03-16 DIAGNOSIS — M170 Bilateral primary osteoarthritis of knee: Secondary | ICD-10-CM

## 2023-03-16 NOTE — Assessment & Plan Note (Signed)
Patient is doing well with doing well overall.  Discussed icing regimen and home exercises, discussed which activities to do and which ones to avoid.  Hold on any injections today.  Will see patient back in clinic in 6 to 8 weeks otherwise we did discuss the intermittent use of Aleve

## 2023-03-16 NOTE — Patient Instructions (Signed)
Good to see you! Aleve 2 pills 2 times a day for 3 days when needed See you again in 7-8 weeks

## 2023-03-25 ENCOUNTER — Encounter (HOSPITAL_BASED_OUTPATIENT_CLINIC_OR_DEPARTMENT_OTHER): Payer: Self-pay | Admitting: Cardiovascular Disease

## 2023-05-22 NOTE — Progress Notes (Unsigned)
[name] [address] [place] [address] Phone: [phone] Subjective:   [name], am serving as a scribe for Dr. Antoine Primas.  I'm seeing this patient by the request  of:  Alysia Penna, MD  CC: Knee pain follow-up  [id number]  03/16/2023 Patient is doing well with doing well overall. Discussed icing regimen and home exercises, discussed which activities to do and which ones to avoid. Hold on any injections today. Will see patient back in clinic in 6 to 8 weeks otherwise we did discuss the intermittent use of Aleve   Update 05/23/2023 Julie [name] is a 82 y.o. female coming in with complaint of R knee and lower back pain. Patient states that down hill and descending stairs increases her pain. Swelling occurring with weight bearing.   Also co pain in lumbar spine. Played golf recently which made her back sore. Standing in one spot increases her pain as well. Activity typically improves her pain though.       Past Medical History:  Diagnosis Date   Arthritis    Asthma    CAD in native artery 06/06/2017   Mild LAD disease on coronary CT-A 05/2017.   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   Procedure: RIGHT TOTAL HIP ARTHROPLASTY ANTERIOR APPROACH;  Surgeon: Marcene Corning, MD;  Location: [facility];  Service: Orthopedics;   Laterality: Right;   TOTAL THYROIDECTOMY     Social History   Socioeconomic History   Marital status: Media planner    Spouse name: Okey Regal   Number of children: Not on file   Years of education: Not on file   Highest education level: Master's degree (e.g., MA, MS, MEng, MEd, MSW, MBA)  Occupational History   Not on file  Tobacco Use   Smoking status: Never   Smokeless tobacco: Never  Vaping Use   Vaping status: Never Used  Substance and Sexual Activity   Alcohol use: Yes    Comment: 12/02/21 3 daily   Drug use: No   Sexual activity: Not Currently  Other Topics Concern   Not on file  Social History Narrative   Lives with partner   Social Determinants of Health   Financial Resource Strain: Not on file  Food Insecurity: Not on file  Transportation Needs: Not on file  Physical Activity: Not on file  Stress: Not on file  Social Connections: Not on file   Allergies  Allergen Reactions   Losartan Anaphylaxis    Diffuse hives and tongue swelling   Propantheline Rash    Cannot remember other symptoms    Atorvastatin     Myalgia    Gluten Meal Diarrhea    bloating   Rosuvastatin  Sulfa Antibiotics Swelling   Zetia [Ezetimibe]     Myalgias    Demerol [Meperidine] Rash   Morphine And Codeine Rash   Family History  Problem Relation Age of Onset   Peripheral Artery Disease Mother    Dementia Father     Current Outpatient Medications (Endocrine & Metabolic):    levothyroxine (SYNTHROID) 125 MCG tablet, Take 125 mcg by mouth daily before breakfast.  Current Outpatient Medications (Cardiovascular):    Evolocumab (REPATHA SURECLICK) 140 MG/ML SOAJ, INJECT 140 MG INTO THE SKIN EVERY 14 (FOURTEEN) DAYS.  Current Outpatient Medications (Respiratory):    diphenhydrAMINE (BENADRYL) 25 MG tablet, Take 1 tablet (25 mg total) by mouth every 8 (eight) hours as needed for up to 21 doses for allergies or sleep.  Current Outpatient Medications (Analgesics):    aspirin EC  81 MG tablet, Take 81 mg by mouth daily. Swallow whole.   Current Outpatient Medications (Other):    cholecalciferol (VITAMIN D) 1000 units tablet, Take 1,000 Units by mouth daily.   Multiple Vitamins-Minerals (MULTIVITAMIN WITH MINERALS) tablet, Take 1 tablet by mouth daily.   Multiple Vitamins-Minerals (PRESERVISION AREDS 2 PO), Take 1 tablet by mouth daily.   Reviewed prior external information including notes and imaging from  primary care provider As well as notes that were available from care everywhere and other healthcare systems.  Past medical history, social, surgical and family history all reviewed in electronic medical record.  No pertanent information unless stated regarding to the chief complaint.   Review of Systems:  No headache, visual changes, nausea, vomiting, diarrhea, constipation, dizziness, abdominal pain, skin rash, fevers, chills, night sweats, weight loss, swollen lymph nodes, body aches, joint swelling, chest pain, shortness of breath, mood changes. POSITIVE muscle aches  Objective  Blood pressure 124/74, pulse 66, height 5\' 5"  (1.651 m), weight 150 lb (68 kg), SpO2 95%.   General: No apparent distress alert and oriented x3 mood and affect normal, dressed appropriately.  HEENT: Pupils equal, extraocular movements intact  Respiratory: Patient's speak in full sentences and does not appear short of breath  Cardiovascular: No lower extremity edema, non tender, no erythema  Bilateral knee exam show arthritic changes but seems to be more worse on the right.  Does have a effusion noted as well. Patient's back does have significant tenderness over the sacroiliac joints bilaterally.  Seems to be left greater than right.  Positive FABER test.  Negative straight leg test but worsening pain in the back with extension.  After informed written and verbal consent, patient was seated on exam table. Right knee was prepped with alcohol swab and utilizing anterolateral approach,  patient's right knee space was injected with 4:1  marcaine 0.5%: Kenalog 40mg /dL. Patient tolerated the procedure well without immediate complications.  After verbal consent patient was prepped with alcohol swab and with a 21-gauge 2 inch needle injected into the left sacroiliac joint with a total of 1 cc of 0.5% Marcaine and 1 cc of Kenalog 40 mg/mL.  No blood loss.  Band-Aid placed.  Postinjection instructions given  After verbal consent patient was prepped with alcohol swab and with a 21-gauge 2 inch needle injected into the right sacroiliac joint with a total of 1 cc of 0.5% Marcaine and 1 cc of Kenalog 40 mg/mL.  No blood loss.  Band-Aid placed postinjection instructions given.   Impression and Recommendations:     The above documentation has been reviewed and is accurate and complete Judi Saa, DO

## 2023-05-23 ENCOUNTER — Ambulatory Visit (INDEPENDENT_AMBULATORY_CARE_PROVIDER_SITE_OTHER): Payer: Medicare Other | Admitting: Family Medicine

## 2023-05-23 ENCOUNTER — Encounter: Payer: Self-pay | Admitting: Family Medicine

## 2023-05-23 VITALS — BP 124/74 | HR 66 | Ht 65.0 in | Wt 150.0 lb

## 2023-05-23 DIAGNOSIS — M25561 Pain in right knee: Secondary | ICD-10-CM

## 2023-05-23 DIAGNOSIS — M5459 Other low back pain: Secondary | ICD-10-CM | POA: Diagnosis not present

## 2023-05-23 DIAGNOSIS — M461 Sacroiliitis, not elsewhere classified: Secondary | ICD-10-CM | POA: Insufficient documentation

## 2023-05-23 DIAGNOSIS — M47818 Spondylosis without myelopathy or radiculopathy, sacral and sacrococcygeal region: Secondary | ICD-10-CM | POA: Insufficient documentation

## 2023-05-23 NOTE — Assessment & Plan Note (Signed)
Chronic, with worsening symptoms.  Discussed icing regimen and home exercises, discussed which activities to do and which ones to avoid.  Increase activity slowly over the course of next several weeks.  Discussed which activities to do and which ones to avoid.  Increase activity slowly.  Follow-up again in 6 to 8 weeks

## 2023-05-23 NOTE — Patient Instructions (Addendum)
Good to see you Injected SI joints and R knee See me again in 10 weeks

## 2023-05-23 NOTE — Assessment & Plan Note (Signed)
Bilateral injections given today.  Tolerated the procedure well.  Hopeful that this will make some improvement.  Discussed with patient's that otherwise I do think that the patient could be a candidate for medial branch blocks with some of the facet arthropathy.  Increase activity slowly otherwise.  Follow-up again in 6 to 8 weeks otherwise.

## 2023-06-29 ENCOUNTER — Inpatient Hospital Stay (HOSPITAL_BASED_OUTPATIENT_CLINIC_OR_DEPARTMENT_OTHER)
Admission: EM | Admit: 2023-06-29 | Discharge: 2023-07-02 | DRG: 175 | Disposition: A | Discharge status: Home / Self Care | Payer: Medicare Other | Attending: Family Medicine | Admitting: Family Medicine

## 2023-06-29 ENCOUNTER — Encounter (HOSPITAL_BASED_OUTPATIENT_CLINIC_OR_DEPARTMENT_OTHER): Payer: Self-pay | Admitting: *Deleted

## 2023-06-29 ENCOUNTER — Other Ambulatory Visit (HOSPITAL_BASED_OUTPATIENT_CLINIC_OR_DEPARTMENT_OTHER): Payer: Self-pay

## 2023-06-29 ENCOUNTER — Emergency Department (HOSPITAL_BASED_OUTPATIENT_CLINIC_OR_DEPARTMENT_OTHER): Payer: Medicare Other | Admitting: Radiology

## 2023-06-29 ENCOUNTER — Emergency Department (HOSPITAL_COMMUNITY): Payer: Medicare Other

## 2023-06-29 ENCOUNTER — Other Ambulatory Visit: Payer: Self-pay

## 2023-06-29 ENCOUNTER — Emergency Department (HOSPITAL_BASED_OUTPATIENT_CLINIC_OR_DEPARTMENT_OTHER): Payer: Medicare Other

## 2023-06-29 DIAGNOSIS — Z79890 Hormone replacement therapy (postmenopausal): Secondary | ICD-10-CM

## 2023-06-29 DIAGNOSIS — K900 Celiac disease: Secondary | ICD-10-CM | POA: Diagnosis present

## 2023-06-29 DIAGNOSIS — E871 Hypo-osmolality and hyponatremia: Secondary | ICD-10-CM | POA: Diagnosis present

## 2023-06-29 DIAGNOSIS — I10 Essential (primary) hypertension: Secondary | ICD-10-CM | POA: Diagnosis not present

## 2023-06-29 DIAGNOSIS — I2489 Other forms of acute ischemic heart disease: Secondary | ICD-10-CM | POA: Diagnosis present

## 2023-06-29 DIAGNOSIS — I2609 Other pulmonary embolism with acute cor pulmonale: Secondary | ICD-10-CM

## 2023-06-29 DIAGNOSIS — E7800 Pure hypercholesterolemia, unspecified: Secondary | ICD-10-CM | POA: Diagnosis present

## 2023-06-29 DIAGNOSIS — Z882 Allergy status to sulfonamides status: Secondary | ICD-10-CM | POA: Diagnosis not present

## 2023-06-29 DIAGNOSIS — G8929 Other chronic pain: Secondary | ICD-10-CM | POA: Diagnosis present

## 2023-06-29 DIAGNOSIS — I82411 Acute embolism and thrombosis of right femoral vein: Secondary | ICD-10-CM | POA: Diagnosis not present

## 2023-06-29 DIAGNOSIS — D696 Thrombocytopenia, unspecified: Secondary | ICD-10-CM | POA: Diagnosis not present

## 2023-06-29 DIAGNOSIS — I2602 Saddle embolus of pulmonary artery with acute cor pulmonale: Secondary | ICD-10-CM | POA: Diagnosis not present

## 2023-06-29 DIAGNOSIS — Z85828 Personal history of other malignant neoplasm of skin: Secondary | ICD-10-CM

## 2023-06-29 DIAGNOSIS — I2699 Other pulmonary embolism without acute cor pulmonale: Secondary | ICD-10-CM | POA: Diagnosis present

## 2023-06-29 DIAGNOSIS — I82451 Acute embolism and thrombosis of right peroneal vein: Secondary | ICD-10-CM | POA: Diagnosis not present

## 2023-06-29 DIAGNOSIS — J45909 Unspecified asthma, uncomplicated: Secondary | ICD-10-CM | POA: Diagnosis not present

## 2023-06-29 DIAGNOSIS — Z885 Allergy status to narcotic agent status: Secondary | ICD-10-CM

## 2023-06-29 DIAGNOSIS — E890 Postprocedural hypothyroidism: Secondary | ICD-10-CM | POA: Diagnosis not present

## 2023-06-29 DIAGNOSIS — R0602 Shortness of breath: Secondary | ICD-10-CM | POA: Diagnosis present

## 2023-06-29 DIAGNOSIS — I82441 Acute embolism and thrombosis of right tibial vein: Secondary | ICD-10-CM | POA: Diagnosis not present

## 2023-06-29 DIAGNOSIS — I700 Atherosclerosis of aorta: Secondary | ICD-10-CM | POA: Diagnosis present

## 2023-06-29 DIAGNOSIS — Z96641 Presence of right artificial hip joint: Secondary | ICD-10-CM | POA: Diagnosis present

## 2023-06-29 DIAGNOSIS — Z90710 Acquired absence of both cervix and uterus: Secondary | ICD-10-CM

## 2023-06-29 DIAGNOSIS — E785 Hyperlipidemia, unspecified: Secondary | ICD-10-CM | POA: Diagnosis not present

## 2023-06-29 DIAGNOSIS — Z86008 Personal history of in-situ neoplasm of other site: Secondary | ICD-10-CM | POA: Diagnosis not present

## 2023-06-29 DIAGNOSIS — Z888 Allergy status to other drugs, medicaments and biological substances status: Secondary | ICD-10-CM

## 2023-06-29 DIAGNOSIS — Z8673 Personal history of transient ischemic attack (TIA), and cerebral infarction without residual deficits: Secondary | ICD-10-CM | POA: Diagnosis not present

## 2023-06-29 DIAGNOSIS — M1990 Unspecified osteoarthritis, unspecified site: Secondary | ICD-10-CM | POA: Diagnosis present

## 2023-06-29 DIAGNOSIS — Z79899 Other long term (current) drug therapy: Secondary | ICD-10-CM

## 2023-06-29 DIAGNOSIS — I82431 Acute embolism and thrombosis of right popliteal vein: Secondary | ICD-10-CM | POA: Diagnosis not present

## 2023-06-29 DIAGNOSIS — Z91018 Allergy to other foods: Secondary | ICD-10-CM

## 2023-06-29 DIAGNOSIS — I2692 Saddle embolus of pulmonary artery without acute cor pulmonale: Secondary | ICD-10-CM

## 2023-06-29 DIAGNOSIS — H3530 Unspecified macular degeneration: Secondary | ICD-10-CM | POA: Diagnosis present

## 2023-06-29 DIAGNOSIS — Z8249 Family history of ischemic heart disease and other diseases of the circulatory system: Secondary | ICD-10-CM

## 2023-06-29 DIAGNOSIS — I2510 Atherosclerotic heart disease of native coronary artery without angina pectoris: Secondary | ICD-10-CM | POA: Diagnosis not present

## 2023-06-29 DIAGNOSIS — E079 Disorder of thyroid, unspecified: Secondary | ICD-10-CM | POA: Diagnosis present

## 2023-06-29 DIAGNOSIS — D751 Secondary polycythemia: Secondary | ICD-10-CM | POA: Diagnosis present

## 2023-06-29 LAB — COMPREHENSIVE METABOLIC PANEL
ALT: 28 U/L (ref 0–44)
AST: 31 U/L (ref 15–41)
Albumin: 3.9 g/dL (ref 3.5–5.0)
Alkaline Phosphatase: 76 U/L (ref 38–126)
Anion gap: 8 (ref 5–15)
BUN: 12 mg/dL (ref 8–23)
CO2: 25 mmol/L (ref 22–32)
Calcium: 8.5 mg/dL — ABNORMAL LOW (ref 8.9–10.3)
Chloride: 101 mmol/L (ref 98–111)
Creatinine, Ser: 0.85 mg/dL (ref 0.44–1.00)
GFR, Estimated: >60 mL/min (ref >60)
Glucose, Bld: 172 mg/dL — ABNORMAL HIGH (ref 70–99)
Potassium: 4.4 mmol/L (ref 3.5–5.1)
Sodium: 134 mmol/L — ABNORMAL LOW (ref 135–145)
Total Bilirubin: 1 mg/dL (ref 0.3–1.2)
Total Protein: 6.6 g/dL (ref 6.5–8.1)

## 2023-06-29 LAB — CBC WITH DIFFERENTIAL/PLATELET
Abs Immature Granulocytes: 0.02 10*3/uL (ref 0.00–0.07)
Basophils Absolute: 0 10*3/uL (ref 0.0–0.1)
Basophils Relative: 0 %
Eosinophils Absolute: 0.1 10*3/uL (ref 0.0–0.5)
Eosinophils Relative: 2 %
HCT: 45.5 % (ref 36.0–46.0)
Hemoglobin: 15.7 g/dL — ABNORMAL HIGH (ref 12.0–15.0)
Immature Granulocytes: 0 %
Lymphocytes Relative: 25 %
Lymphs Abs: 1.6 10*3/uL (ref 0.7–4.0)
MCH: 34 pg (ref 26.0–34.0)
MCHC: 34.5 g/dL (ref 30.0–36.0)
MCV: 98.5 fL (ref 80.0–100.0)
Monocytes Absolute: 0.5 10*3/uL (ref 0.1–1.0)
Monocytes Relative: 7 %
Neutro Abs: 4.4 10*3/uL (ref 1.7–7.7)
Neutrophils Relative %: 66 %
Platelets: 127 10*3/uL — ABNORMAL LOW (ref 150–400)
RBC: 4.62 MIL/uL (ref 3.87–5.11)
RDW: 12.9 % (ref 11.5–15.5)
WBC: 6.7 10*3/uL (ref 4.0–10.5)
nRBC: 0 % (ref 0.0–0.2)

## 2023-06-29 LAB — ECHOCARDIOGRAM COMPLETE
Calc EF: 45.5 %
Height: 65 in
S' Lateral: 2.7 cm
Single Plane A2C EF: 39.5 %
Single Plane A4C EF: 48.5 %
Weight: 2400 [oz_av]

## 2023-06-29 LAB — TROPONIN I (HIGH SENSITIVITY)
Troponin I (High Sensitivity): 241 ng/L (ref <18)
Troponin I (High Sensitivity): 295 ng/L (ref <18)

## 2023-06-29 LAB — D-DIMER, QUANTITATIVE: D-Dimer, Quant: 16.19 ug{FEU}/mL — ABNORMAL HIGH (ref 0.00–0.50)

## 2023-06-29 LAB — LACTIC ACID, PLASMA: Lactic Acid, Venous: 1.1 mmol/L (ref 0.5–1.9)

## 2023-06-29 LAB — BRAIN NATRIURETIC PEPTIDE: B Natriuretic Peptide: 81.9 pg/mL (ref 0.0–100.0)

## 2023-06-29 MED ORDER — HEPARIN BOLUS VIA INFUSION
4000.0000 [IU] | Freq: Once | INTRAVENOUS | Status: AC
  Administered 2023-06-29: 4000 [IU] via INTRAVENOUS

## 2023-06-29 MED ORDER — ACETAMINOPHEN 650 MG RE SUPP: 650.0000 mg | Freq: Four times a day (QID) | RECTAL | Status: DC | PRN

## 2023-06-29 MED ORDER — ALBUTEROL SULFATE HFA 108 (90 BASE) MCG/ACT IN AERS: 2.0000 | INHALATION_SPRAY | RESPIRATORY_TRACT | Status: DC | PRN

## 2023-06-29 MED ORDER — POLYETHYLENE GLYCOL 3350 17 G PO PACK: 17.0000 g | PACK | Freq: Every day | ORAL | Status: DC | PRN

## 2023-06-29 MED ORDER — HEPARIN (PORCINE) 25000 UT/250ML-% IV SOLN
1150.0000 [IU]/h | INTRAVENOUS | Status: DC
  Administered 2023-06-29 – 2023-06-30 (×2): 1200 [IU]/h via INTRAVENOUS
  Administered 2023-07-01: 1150 [IU]/h via INTRAVENOUS
  Filled 2023-06-29 (×3): qty 250

## 2023-06-29 MED ORDER — LEVOTHYROXINE SODIUM 25 MCG PO TABS
125.0000 ug | ORAL_TABLET | Freq: Every day | ORAL | Status: DC
  Administered 2023-06-30 – 2023-07-02 (×3): 125 ug via ORAL
  Filled 2023-06-29 (×3): qty 1

## 2023-06-29 MED ORDER — SODIUM CHLORIDE 0.9% FLUSH
3.0000 mL | Freq: Two times a day (BID) | INTRAVENOUS | Status: DC
Start: 2023-06-29 — End: 2023-07-02
  Administered 2023-06-30 – 2023-07-02 (×3): 3 mL via INTRAVENOUS

## 2023-06-29 MED ORDER — IOHEXOL 350 MG/ML SOLN
100.0000 mL | Freq: Once | INTRAVENOUS | Status: AC | PRN
  Administered 2023-06-29: 75 mL via INTRAVENOUS

## 2023-06-29 MED ORDER — ACETAMINOPHEN 325 MG PO TABS: 650.0000 mg | ORAL_TABLET | Freq: Four times a day (QID) | ORAL | Status: DC | PRN

## 2023-06-29 NOTE — ED Notes (Signed)
Shift report received, assumed care of patient at this time 

## 2023-06-29 NOTE — ED Provider Notes (Signed)
  Provider Note MRN:  [id number]  Arrival date & time: 06/29/23    ED Course and Medical Decision Making  Assumed care from Dr Deretha Emory at shift change.  See note from prior team for complete details, in brief:  82 yo female here as transfer from [facility] Found to have saddle PE, on heparin Trop >200, BNP 81.9 Heparin gtt Placed on [place] for WOB, 2L D/w patient at length regarding her concerns about today's visit  D/w hospitalist and PCCM  Pt well appearing, HDS   .Critical Care  Performed by: Sloan Leiter, DO Authorized by: Sloan Leiter, DO   Critical care provider statement:    Critical care time (minutes):  30   Critical care was necessary to treat or prevent imminent or life-threatening deterioration of the following conditions:  Circulatory failure and cardiac failure   Critical care was time spent personally by me on the following activities:  Development of treatment plan with patient or surrogate, discussions with consultants, evaluation of patient's response to treatment, examination of patient, ordering and review of laboratory studies, ordering and review of radiographic studies, ordering and performing treatments and interventions, pulse oximetry, re-evaluation of patient's condition, review of old charts and obtaining history from patient or surrogate   Care discussed with: admitting provider     Care discussed with comment:  Critical care/TRH   Final Clinical Impressions(s) / ED Diagnoses     ICD-10-CM   1. Acute saddle pulmonary embolism with acute cor pulmonale (HCC)  I26.02     2. Exertional shortness of breath  R06.02       ED Discharge Orders     None       Discharge Instructions   None        Sloan Leiter, DO 06/29/23 1602

## 2023-06-29 NOTE — ED Notes (Signed)
ED Provider at bedside. 

## 2023-06-29 NOTE — ED Triage Notes (Signed)
Patient bib [facility] as a transfer from [facility] with Saddle bag pulmonary embolism with other scattered pulmonary emobolis with right heart strain.  On arrival to ed heparin going at 1200 units a hour, had a 4000 unit bolus at 1424. VSS with [facility], A&Ox4.

## 2023-06-29 NOTE — ED Notes (Signed)
ED TO INPATIENT HANDOFF REPORT  ED Nurse Name and Phone #: Joneen Boers, Paramedic / [phone]  S Name/Age/Gender Julie [name] 82 y.o. female Room/Bed: 028C/028C  Code Status   Code Status: Full Code  Home/SNF/Other Home Patient oriented to: self, place, time, and situation Is this baseline? Yes   Triage Complete: Triage complete  Chief Complaint Acute pulmonary embolism (HCC) [I26.99]  Triage Note Pt is here for evaluation of sob which began this am ans was worse with activity.  Pt also states that she "does not feel right".  No CP with this.   Patient bib [facility] as a transfer from [facility] with Saddle bag pulmonary embolism with other scattered pulmonary emobolis with right heart strain.  On arrival to ed heparin going at 1200 units a hour, had a 4000 unit bolus at 1424. VSS with [facility], A&Ox4.    Allergies Allergies  Allergen Reactions   Losartan Anaphylaxis    Diffuse hives and tongue swelling   Propantheline Rash    Cannot remember other symptoms    Atorvastatin     Myalgia    Gluten Meal Diarrhea    bloating   Rosuvastatin    Sulfa Antibiotics Swelling   Zetia [Ezetimibe]     Myalgias    Demerol [Meperidine] Rash   Morphine And Codeine Rash    Level of Care/Admitting Diagnosis ED Disposition     ED Disposition  Admit   Condition  --   Comment  Hospital Area: [facility] [[id number]]  Level of Care: Progressive [102]  Admit to Progressive based on following criteria: CARDIOVASCULAR & THORACIC of moderate stability with acute coronary syndrome symptoms/low risk myocardial infarction/hypertensive urgency/arrhythmias/heart failure potentially compromising stability and stable post cardiovascular intervention patients.  Admit to Progressive based on following criteria: Other see comments  Comments: Saddle PE, PCCM rec progressive as patient is stable  May place patient in observation at [facility] or [facility] if equivalent level  of care is available:: No  Covid Evaluation: Asymptomatic - no recent exposure (last 10 days) testing not required  Diagnosis: Acute pulmonary embolism [facility]) [[id number]]  Admitting Physician: Synetta Fail [[id number]]  Attending Physician: Synetta Fail [[id number]]          B Medical/Surgery History Past Medical History:  Diagnosis Date   Arthritis    Asthma    CAD in native artery 06/06/2017   Mild LAD disease on coronary CT-A 05/2017.   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   Procedure: RIGHT TOTAL HIP ARTHROPLASTY ANTERIOR APPROACH;  Surgeon: Marcene Corning, MD;  Location: [facility];  Service: Orthopedics;  Laterality: Right;   TOTAL THYROIDECTOMY       A IV Location/Drains/Wounds Patient Lines/Drains/Airways Status  Active Line/Drains/Airways     Name Placement date Placement time Site Days   Peripheral IV 06/29/23 20 G Right Antecubital 06/29/23  1109  Antecubital  less than 1   Incision (Closed) 08/06/19 Hip Right 08/06/19  1131  -- 1423            Intake/Output Last 24 hours No intake or output data in the 24 hours ending 06/29/23 1641  Labs/Imaging Results for orders placed or performed during the hospital encounter of 06/29/23 (from the past 48 hour(s))  CBC with Differential/Platelet     Status: Abnormal   Collection Time: 06/29/23 11:10  AM  Result Value Ref Range   WBC 6.7 4.0 - 10.5 K/uL   RBC 4.62 3.87 - 5.11 MIL/uL   Hemoglobin 15.7 (H) 12.0 - 15.0 g/dL   HCT 29.5 62.1 - 30.8 %   MCV 98.5 80.0 - 100.0 fL   MCH 34.0 26.0 - 34.0 pg   MCHC 34.5 30.0 - 36.0 g/dL   RDW 65.7 84.6 - 96.2 %   Platelets 127 (L) 150 - 400 K/uL   nRBC 0.0 0.0 - 0.2 %   Neutrophils Relative % 66 %   Neutro Abs 4.4 1.7 - 7.7 K/uL   Lymphocytes Relative 25 %   Lymphs Abs 1.6 0.7 - 4.0 K/uL   Monocytes Relative 7 %   Monocytes Absolute 0.5 0.1 - 1.0 K/uL   Eosinophils Relative 2 %   Eosinophils Absolute 0.1 0.0 - 0.5 K/uL   Basophils Relative 0 %   Basophils Absolute 0.0 0.0 - 0.1 K/uL   Immature Granulocytes 0 %   Abs Immature Granulocytes 0.02 0.00 - 0.07 K/uL    Comment: Performed at [facility], [address], [place], [place] [address]  Comprehensive metabolic panel     Status: Abnormal   Collection Time: 06/29/23 11:10 AM  Result Value Ref Range   Sodium 134 (L) 135 - 145 mmol/L   Potassium 4.4 3.5 - 5.1 mmol/L   Chloride 101 98 - 111 mmol/L   CO2 25 22 - 32 mmol/L   Glucose, Bld 172 (H) 70 - 99 mg/dL    Comment: Glucose reference range applies only to samples taken after fasting for at least 8 hours.   BUN 12 8 - 23 mg/dL   Creatinine, Ser 1.32 0.44 - 1.00 mg/dL   Calcium 8.5 (L) 8.9 - 10.3 mg/dL   Total Protein 6.6 6.5 - 8.1 g/dL   Albumin 3.9 3.5 - 5.0 g/dL   AST 31 15 - 41 U/L   ALT 28 0 - 44 U/L   Alkaline Phosphatase 76 38 - 126 U/L   Total Bilirubin 1.0 0.3 - 1.2 mg/dL   GFR, Estimated >44 >01 mL/min    Comment: (NOTE) Calculated using the CKD-EPI Creatinine Equation (2021)    Anion gap 8 5 - 15    Comment: Performed at [facility], [address] [place], [place] [address]  D-dimer, quantitative     Status: Abnormal   Collection Time: 06/29/23 11:10 AM  Result Value Ref Range   D-Dimer, Quant 16.19 (H) 0.00 - 0.50 ug/mL-FEU    Comment: (NOTE) At the manufacturer  cut-off value of 0.5 g/mL FEU, this assay has a negative predictive value of 95-100%.This assay is intended for use in conjunction with a clinical pretest probability (PTP) assessment model to exclude pulmonary embolism (PE) and deep venous thrombosis (DVT) in outpatients suspected of PE or DVT. Results should be correlated with  clinical presentation. Performed at [facility], [address] [place], [place] [address]   Troponin I (High Sensitivity)     Status: Abnormal   Collection Time: 06/29/23 11:47 AM  Result Value Ref Range   Troponin I (High Sensitivity) 241 (HH) <18 ng/L    Comment: CRITICAL RESULT CALLED TO, READ BACK BY AND VERIFIED WITH: Vladimir Faster, RN [phone] 06/29/2023 DBRADLEY (NOTE) Elevated high sensitivity troponin I (hsTnI) values and significant  changes across serial measurements may suggest ACS but many other  chronic and acute conditions are known to elevate hsTnI results.  Refer to the Links section for chest pain algorithms and additional  guidance. Performed at [facility], [address] [place], [place] [address]   Brain natriuretic peptide     Status: None   Collection Time: 06/29/23 11:48 AM  Result Value Ref Range   B Natriuretic Peptide 81.9 0.0 - 100.0 pg/mL    Comment: Performed at [facility], [address] [place], [place] [address]  Troponin I (High Sensitivity)     Status: Abnormal   Collection Time: 06/29/23  2:05 PM  Result Value Ref Range   Troponin I (High Sensitivity) 295 (HH) <18 ng/L    Comment: DELTA CHECK NOTED CRITICAL VALUE NOTED.  VALUE IS CONSISTENT WITH PREVIOUSLY REPORTED AND CALLED VALUE. (NOTE) Elevated high sensitivity troponin I (hsTnI) values and significant  changes across serial measurements may suggest ACS but many other  chronic and acute conditions are known to elevate hsTnI results.  Refer to the Links section for chest pain algorithms and additional   guidance. Performed at [facility], [address], [place], [place] [address]   Lactic acid, plasma     Status: None   Collection Time: 06/29/23  4:01 PM  Result Value Ref Range   Lactic Acid, Venous 1.1 0.5 - 1.9 mmol/L    Comment: Performed at [facility], [address] [place], [place] [address]   CT Angio Chest PE W/Cm &/Or Wo Cm  Result Date: 06/29/2023 CLINICAL DATA:  Acute pulmonary embolism suspected. Positive D-dimer. Shortness of breath. EXAM: CT ANGIOGRAPHY CHEST WITH CONTRAST TECHNIQUE: Multidetector CT imaging of the chest was performed using the standard protocol during bolus administration of intravenous contrast. Multiplanar CT image reconstructions and MIPs were obtained to evaluate the vascular anatomy. RADIATION DOSE REDUCTION: This exam was performed according to the departmental dose-optimization program which includes automated exposure control, adjustment of the mA and/or kV according to patient size and/or use of iterative reconstruction technique. CONTRAST:  75mL OMNIPAQUE IOHEXOL 350 MG/ML SOLN COMPARISON:  05/29/2017. FINDINGS: Cardiovascular: Satisfactory opacification of the pulmonary arteries to the segmental level. Examination is positive for acute bilateral pulmonary emboli with saddle embolus involving the right and left main pulmonary arteries. Emboli extend into the segmental pulmonary arteries to the left upper lobe, left lower lobe, right lower lobe and right upper lobe. Right middle lobe pulmonary emboli also noted. The RV to LV ratio is equal to 1.2 compatible with right heart strain. Reflux of contrast material into the IVC and proximal hepatic veins noted. Heart size is normal. Coronary artery calcifications. Aortic atherosclerosis. No pericardial effusion. Mediastinum/Nodes: No enlarged mediastinal, hilar, or axillary lymph nodes. Thyroid gland, trachea, and esophagus demonstrate no significant findings. Lungs/Pleura: There is no  pleural effusion, airspace consolidation, or pneumothorax. Peripheral areas of subsegmental atelectasis noted in the lower lung zones. No consolidative change. Calcified granuloma identified in the left lower lobe. Upper Abdomen: No  acute abnormality. Musculoskeletal: Thoracolumbar scoliosis and multilevel degenerative disc disease. No acute or suspicious osseous abnormality. No chest wall mass. Review of the MIP images confirms the above findings. IMPRESSION: 1. Examination is positive for acute bilateral pulmonary emboli with saddle embolus involving the right and left main pulmonary arteries. Emboli extend into the segmental pulmonary arteries to the bilateral upper and lower lobes and right middle lobe pulmonary arteries. 2. Positive for right heart strain (RV/LV Ratio = 1.2) the presence of right heart strain has been associated with an increased risk of morbidity and mortality. 3. Coronary artery calcifications. 4.  Aortic Atherosclerosis (ICD10-I70.0). Critical Value/emergent results were called by telephone at the time of interpretation on 06/29/2023 at 1:50 pm to provider Vanetta Mulders , who verbally acknowledged these results. Electronically Signed   By: Signa Kell M.D.   On: 06/29/2023 13:50   DG Chest 2 View  Result Date: 06/29/2023 CLINICAL DATA:  82 year old female with shortness of breath. Malaise. EXAM: CHEST - 2 VIEW COMPARISON:  Chest radiographs 08/05/2019 and earlier. FINDINGS: PA and lateral views 1149 hours. Stable tortuosity of the thoracic aorta. Other mediastinal contours are within normal limits. Stable lung volumes. Both lungs appear clear. No pneumothorax or pleural effusion. Visualized tracheal air column is within normal limits. No acute osseous abnormality identified. Exaggerated thoracic kyphosis. Negative visible bowel gas. IMPRESSION: No acute cardiopulmonary abnormality. Electronically Signed   By: Odessa Fleming M.D.   On: 06/29/2023 13:21    Pending Labs Unresulted Labs  (From admission, onward)     Start     Ordered   06/30/23 0500  Comprehensive metabolic panel  Tomorrow morning,   R        06/29/23 1636   06/30/23 0500  CBC  Tomorrow morning,   R        06/29/23 1636            Vitals/Pain Today's Vitals   06/29/23 1353 06/29/23 1400 06/29/23 1501 06/29/23 1505  BP:      Pulse:      Resp:      Temp:   98.1 F (36.7 C)   TempSrc:   Oral   SpO2: 97%     Weight:  150 lb (68 kg)    Height:  5\' 5"  (1.651 m)    PainSc:    0-No pain    Isolation Precautions No active isolations  Medications Medications  heparin ADULT infusion 100 units/mL (25000 units/259mL) (1,200 Units/hr Intravenous New Bag/Given 06/29/23 1424)  levothyroxine (SYNTHROID) tablet 125 mcg (has no administration in time range)  sodium chloride flush (NS) 0.9 % injection 3 mL (has no administration in time range)  acetaminophen (TYLENOL) tablet 650 mg (has no administration in time range)    Or  acetaminophen (TYLENOL) suppository 650 mg (has no administration in time range)  polyethylene glycol (MIRALAX / GLYCOLAX) packet 17 g (has no administration in time range)  iohexol (OMNIPAQUE) 350 MG/ML injection 100 mL (75 mLs Intravenous Contrast Given 06/29/23 1324)  heparin bolus via infusion 4,000 Units (4,000 Units Intravenous Bolus from Bag 06/29/23 1424)    Mobility walks     Focused Assessments Cardiac Assessment Handoff:  Cardiac Rhythm: Normal sinus rhythm Lab Results  Component Value Date   TROPONINI <0.03 10/06/2015   Lab Results  Component Value Date   DDIMER 16.19 (H) 06/29/2023   Does the Patient currently have chest pain? No    R Recommendations: See Admitting Provider Note  Report given to:  Additional Notes:

## 2023-06-29 NOTE — Progress Notes (Signed)
PHARMACY - ANTICOAGULATION CONSULT NOTE  Pharmacy Consult for heparin Indication: pulmonary embolus  Allergies  Allergen Reactions   Losartan Anaphylaxis    Diffuse hives and tongue swelling   Propantheline Rash    Cannot remember other symptoms    Atorvastatin     Myalgia    Gluten Meal Diarrhea    bloating   Rosuvastatin    Sulfa Antibiotics Swelling   Zetia [Ezetimibe]     Myalgias    Demerol [Meperidine] Rash   Morphine And Codeine Rash    Patient Measurements: Height: 5\' 5"  (165.1 cm) Weight: 68 kg (150 lb) IBW/kg (Calculated) : 57 Heparin Dosing Weight: 68kg  Vital Signs: Temp: 97.9 F (36.6 C) (10/24 1106) Temp Source: Oral (10/24 1106) BP: 129/88 (10/24 1300) Pulse Rate: 73 (10/24 1330)  Labs: Recent Labs    06/29/23 1110 06/29/23 1147  HGB 15.7*  --   HCT 45.5  --   PLT 127*  --   CREATININE 0.85  --   TROPONINIHS  --  241*    Estimated Creatinine Clearance: 46.7 mL/min (by C-G formula based on SCr of 0.85 mg/dL).   Medical History: Past Medical History:  Diagnosis Date   Arthritis    Asthma    CAD in native artery 06/06/2017   Mild LAD disease on coronary CT-A 05/2017.   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   Thyroid disease    TIA (transient ischemic attack)    2017   Transaminitis 09/15/2020    Medications:  Infusions:   heparin      Assessment: 82 yof presented to the ED with SOB. Found to have a bilateral saddle PE with right heart strain. To start IV heparin. Baseline Hgb is WNL but platelets are low. She is not on anticoagulation PTA.   Goal of Therapy:  Heparin level 0.3-0.7 units/ml Monitor platelets by anticoagulation protocol: Yes   Plan:   Heparin bolus 4000 units IV x 1 Heparin gtt 1200 units/hr Check an 8 hr heparin level Daily heparin level and CBC  Prakash Kimberling, Drake Leach 06/29/2023,2:05 PM

## 2023-06-29 NOTE — Progress Notes (Signed)
  Echocardiogram 2D Echocardiogram has been performed.  Delcie Roch 06/29/2023, 4:43 PM

## 2023-06-29 NOTE — ED Provider Notes (Addendum)
[facility] [facility]ERG[facility] DEPARTMENT AT [facility] Provider Note   CSN: [id number] Arrival date & time: 06/29/23  1058     History  Chief Complaint  Patient presents with   Shortness of Breath    Julie [name] is a 82 y.o. female.  Patient noted today that heel that she normally walks up because lots of shortness of breath and she had difficulty getting up and.  Just walking from the car into here no shortness of breath no real shortness of breath at rest.  Patient states she just does not feel right.  Denies any chest pain or chest discomfort.  Patient did walk up that he will 2 days ago without any difficulty.  Did not attempt it yesterday.  They usually go up that he will when they are walking their dogs.  The onset of the exertional short of breath was about 9 this morning.  Patient does have some right leg swelling.  But that is baseline.  Nothing worse.  Patient is never had anything like this happen before.  Past medical history significant for celiac disease thyroid disease history of TIAs polycythemia asthma hyperlipidemia coronary artery disease in native artery artery in 2018 mild LAD disease on coronary CT.  Hypertension hypothyroidism history of skin cancer high cholesterol some memory loss noted in February 2023.  Past surgical history significant for abdominal hysterectomy bladder surgery secondary to hole from hysterectomy eye surgery bilateral cataracts breast surgery biopsy breast benign.  Total thyroidectomy total hip arthroplasty.  Patient is never used tobacco products.       Home Medications Prior to Admission medications   Medication Sig Start Date End Date Taking? Authorizing Provider  cholecalciferol (VITAMIN D) 1000 units tablet Take 1,000 Units by mouth daily.   Yes [provider]  diphenhydrAMINE (BENADRYL) 25 MG tablet Take 1 tablet (25 mg total) by mouth every 8 (eight) hours as needed for up to 21 doses for allergies or sleep. 07/07/22  Yes  Terald Sleeper, MD  levothyroxine (SYNTHROID) 125 MCG tablet Take 125 mcg by mouth daily before breakfast.   Yes [provider]  Multiple Vitamins-Minerals (MULTIVITAMIN WITH MINERALS) tablet Take 1 tablet by mouth daily.   Yes [provider]  Multiple Vitamins-Minerals (PRESERVISION AREDS 2 PO) Take 1 tablet by mouth daily.   Yes [provider]  Evolocumab (REPATHA SURECLICK) 140 MG/ML SOAJ INJECT 140 MG INTO THE SKIN EVERY 14 (FOURTEEN) DAYS. 09/26/22   Chilton Si, MD      Allergies    Losartan, Propantheline, Atorvastatin, Gluten meal, Rosuvastatin, Sulfa antibiotics, Zetia [ezetimibe], Demerol [meperidine], and Morphine and codeine    Review of Systems   Review of Systems  Constitutional:  Negative for chills and fever.  HENT:  Negative for congestion, ear pain and sore throat.   Eyes:  Negative for pain and visual disturbance.  Respiratory:  Positive for shortness of breath. Negative for cough.   Cardiovascular:  Positive for leg swelling. Negative for chest pain and palpitations.  Gastrointestinal:  Negative for abdominal pain and vomiting.  Genitourinary:  Negative for dysuria and hematuria.  Musculoskeletal:  Negative for arthralgias and back pain.  Skin:  Negative for color change and rash.  Neurological:  Negative for dizziness, seizures, syncope and headaches.  All other systems reviewed and are negative.   Physical Exam Updated Vital Signs BP 129/88   Pulse 73   Temp 97.9 F (36.6 C) (Oral)   Resp 12   SpO2 97%  Physical  Exam Vitals and nursing note reviewed.  Constitutional:      General: She is not in acute distress.    Appearance: Normal appearance. She is well-developed. She is not ill-appearing or toxic-appearing.  HENT:     Head: Normocephalic and atraumatic.     Mouth/Throat:     Mouth: Mucous membranes are moist.  Eyes:     Extraocular Movements: Extraocular movements intact.     Conjunctiva/sclera: Conjunctivae  normal.     Pupils: Pupils are equal, round, and reactive to light.  Cardiovascular:     Rate and Rhythm: Normal rate and regular rhythm.     Heart sounds: No murmur heard. Pulmonary:     Effort: Pulmonary effort is normal. No respiratory distress.     Breath sounds: Normal breath sounds. No wheezing or rales.  Abdominal:     Palpations: Abdomen is soft.     Tenderness: There is no abdominal tenderness.  Musculoskeletal:        General: No swelling.     Cervical back: Normal range of motion and neck supple.     Right lower leg: Edema present.     Left lower leg: No edema.  Skin:    General: Skin is warm and dry.     Capillary Refill: Capillary refill takes less than 2 seconds.  Neurological:     General: No focal deficit present.     Mental Status: She is alert and oriented to person, place, and time.     Cranial Nerves: No cranial nerve deficit.     Sensory: No sensory deficit.     Motor: No weakness.  Psychiatric:        Mood and Affect: Mood normal.     ED Results / Procedures / Treatments   Labs (all labs ordered are listed, but only abnormal results are displayed) Labs Reviewed  CBC WITH DIFFERENTIAL/PLATELET - Abnormal; Notable for the following components:      Result Value   Hemoglobin 15.7 (*)    Platelets 127 (*)    All other components within normal limits  COMPREHENSIVE METABOLIC PANEL - Abnormal; Notable for the following components:   Sodium 134 (*)    Glucose, Bld 172 (*)    Calcium 8.5 (*)    All other components within normal limits  D-DIMER, QUANTITATIVE - Abnormal; Notable for the following components:   D-Dimer, Quant 16.19 (*)    All other components within normal limits  TROPONIN I (HIGH SENSITIVITY) - Abnormal; Notable for the following components:   Troponin I (High Sensitivity) 241 (*)    All other components within normal limits  BRAIN NATRIURETIC PEPTIDE  TROPONIN I (HIGH SENSITIVITY)    EKG EKG Interpretation Date/Time:  Thursday  June 29 2023 11:06:51 EDT Ventricular Rate:  87 PR Interval:  162 QRS Duration:  100 QT Interval:  370 QTC Calculation: 446 R Axis:   -76  Text Interpretation: Sinus rhythm Inferior infarct, old Consider anterior infarct Confirmed by Vanetta Mulders [phone]) on 06/29/2023 11:30:28 AM  Radiology CT Angio Chest PE W/Cm &/Or Wo Cm  Result Date: 06/29/2023 CLINICAL DATA:  Acute pulmonary embolism suspected. Positive D-dimer. Shortness of breath. EXAM: CT ANGIOGRAPHY CHEST WITH CONTRAST TECHNIQUE: Multidetector CT imaging of the chest was performed using the standard protocol during bolus administration of intravenous contrast. Multiplanar CT image reconstructions and MIPs were obtained to evaluate the vascular anatomy. RADIATION DOSE REDUCTION: This exam was performed according to the departmental dose-optimization program which includes automated exposure control, adjustment  of the mA and/or kV according to patient size and/or use of iterative reconstruction technique. CONTRAST:  75mL OMNIPAQUE IOHEXOL 350 MG/ML SOLN COMPARISON:  05/29/2017. FINDINGS: Cardiovascular: Satisfactory opacification of the pulmonary arteries to the segmental level. Examination is positive for acute bilateral pulmonary emboli with saddle embolus involving the right and left main pulmonary arteries. Emboli extend into the segmental pulmonary arteries to the left upper lobe, left lower lobe, right lower lobe and right upper lobe. Right middle lobe pulmonary emboli also noted. The RV to LV ratio is equal to 1.2 compatible with right heart strain. Reflux of contrast material into the IVC and proximal hepatic veins noted. Heart size is normal. Coronary artery calcifications. Aortic atherosclerosis. No pericardial effusion. Mediastinum/Nodes: No enlarged mediastinal, hilar, or axillary lymph nodes. Thyroid gland, trachea, and esophagus demonstrate no significant findings. Lungs/Pleura: There is no pleural effusion, airspace  consolidation, or pneumothorax. Peripheral areas of subsegmental atelectasis noted in the lower lung zones. No consolidative change. Calcified granuloma identified in the left lower lobe. Upper Abdomen: No acute abnormality. Musculoskeletal: Thoracolumbar scoliosis and multilevel degenerative disc disease. No acute or suspicious osseous abnormality. No chest wall mass. Review of the MIP images confirms the above findings. IMPRESSION: 1. Examination is positive for acute bilateral pulmonary emboli with saddle embolus involving the right and left main pulmonary arteries. Emboli extend into the segmental pulmonary arteries to the bilateral upper and lower lobes and right middle lobe pulmonary arteries. 2. Positive for right heart strain (RV/LV Ratio = 1.2) the presence of right heart strain has been associated with an increased risk of morbidity and mortality. 3. Coronary artery calcifications. 4.  Aortic Atherosclerosis (ICD10-I70.0). Critical Value/emergent results were called by telephone at the time of interpretation on 06/29/2023 at 1:50 pm to provider Vanetta Mulders , who verbally acknowledged these results. Electronically Signed   By: Signa Kell M.D.   On: 06/29/2023 13:50   DG Chest 2 View  Result Date: 06/29/2023 CLINICAL DATA:  82 year old female with shortness of breath. Malaise. EXAM: CHEST - 2 VIEW COMPARISON:  Chest radiographs 08/05/2019 and earlier. FINDINGS: PA and lateral views 1149 hours. Stable tortuosity of the thoracic aorta. Other mediastinal contours are within normal limits. Stable lung volumes. Both lungs appear clear. No pneumothorax or pleural effusion. Visualized tracheal air column is within normal limits. No acute osseous abnormality identified. Exaggerated thoracic kyphosis. Negative visible bowel gas. IMPRESSION: No acute cardiopulmonary abnormality. Electronically Signed   By: Odessa Fleming M.D.   On: 06/29/2023 13:21    Procedures Procedures    Medications Ordered in  ED Medications  albuterol (VENTOLIN HFA) 108 (90 Base) MCG/ACT inhaler 2 puff (has no administration in time range)  iohexol (OMNIPAQUE) 350 MG/ML injection 100 mL (75 mLs Intravenous Contrast Given 06/29/23 1324)    ED Course/ Medical Decision Making/ A&P                                 Medical Decision Making Amount and/or Complexity of Data Reviewed Labs: ordered. Radiology: ordered.  Risk Prescription drug management. Decision regarding hospitalization.   Patient with exertional shortness of breath.  She is saturations at rest are 98%.  Blood pressure was 143/87 when she checked in temp 97.9 heart rate 85 and respirations 18.  CBC white count 6.7 hemoglobin 15.7 platelets 127.  Complete metabolic panel pending.  BNP D-dimer pending 2 view chest pending.  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  Patient's initial troponin 241 but her D-dimer was 6 teen 0.19.  This led to CT angio chest.  Regular chest x-ray had no acute findings.  Patient's complete metabolic panel normal with normal renal function CBC normal.  Discussed with with radiologist who called and says that is her bilateral PEs with saddle and right heart strain.  But patient's oxygen levels at room air are very reassuring.  And her blood pressures also are very reassuring.  Will discuss with the critical care specialist.  Will start heparin.  Discussed with critical care they feel that patient is appropriate for hospitalist admission.  They will consult.  CRITICAL CARE Performed by: Vanetta Mulders Total critical care time: 60 minutes Critical care time was exclusive of separately billable procedures and treating other patients. Critical care was necessary to treat or prevent imminent or life-threatening deterioration. Critical care was time spent personally by me on the following activities: development of treatment plan with patient and/or surrogate as well as nursing, discussions  with consultants, evaluation of patient's response to treatment, examination of patient, obtaining history from patient or surrogate, ordering and performing treatments and interventions, ordering and review of laboratory studies, ordering and review of radiographic studies, pulse oximetry and re-evaluation of patient's condition.  Discussed with with hospitalist.  The right now do not have a progressive bed.  Plan was to have her go [facility].  Discussed with Dr. Hipolito Bayley who is excepting.  Will have [facility] take her.  Heparin is being started.  Other caveat is that patient has had a history of macular degeneration.  Patient's ophthalmologist or Dr. Burgess Estelle and she is followed by a retinal specialist Dr. Leanna Sato  And she just had some injections in the eye on Friday and there was a little bit of bleeding.  But everybody agrees that we have no choice but to start the heparin.  Hospitalist requested stat echocardiogram.  And critical care will consult.   Final Clinical Impression(s) / ED Diagnoses Final diagnoses:  Exertional shortness of breath  Acute saddle pulmonary embolism with acute cor pulmonale [facility])    Rx / DC Orders ED Discharge Orders     None         Vanetta Mulders, MD 06/29/23 1159    Vanetta Mulders, MD 06/29/23 1349    Vanetta Mulders, MD 06/29/23 1356    Vanetta Mulders, MD 06/29/23 1427

## 2023-06-29 NOTE — Progress Notes (Signed)
   PCCM transfer request    Sending physician: Dr. Deretha Emory  Sending facility: [facility]  Reason for transfer: Submassive acute pulmonary embolism  Brief case summary:  82 year old new dyspnea walking up [place] presented to [facility] with CTA demonstrating large clot burden saddle embolus with evidence of right heart strain RV to LV ratio 1.2.  With mild elevation in troponin, BNP within normal limits.  Meets imaging and serologic criteria submassive PE.  On room air sats mid 90s.  Not tachycardic.  Normotensive.  Recommendations made prior to transfer: -- Continue heparin -- Will need stat TTE to evaluate for any RV dysfunction, reviewed 2017 TTE with normal RV size and function -- Admit to [facility], pulmonary medicine is happy to provide consultative services  Transfer accepted: no    Karren Burly 06/29/23 1:57 PM [facility]  For contact information, see Amion. If no response to pager, please call PCCM consult pager. After hours, 7PM- 7AM, please call Elink.

## 2023-06-29 NOTE — H&P (Signed)
History and Physical   ROHA POND ZOX:[id number] DOB: 1941/07/18 DOA: 06/29/2023  PCP: Alysia Penna, MD   Patient coming from: Home  Chief Complaint: Shortness of breath  HPI: Julie [name] is a 82 y.o. female with medical history significant of hyperlipidemia, TIA, memory loss, CAD, chronic pain, statin myopathy, hypothyroidism, polycythemia, celiac disease, macular degeneration presenting with shortness of breath.  Patient noted new onset shortness of breath for the past day or so.  She specifically noticed it on exertion especially when attempting to walk up a hill that she has walked up before without issue including just 2 days ago.  Denies any chest pain.  Does report some right lower extremity edema but this is similar to baseline for her.  Denies fevers, chills, abdominal pain, constipation, diarrhea, nausea, vomiting.  ED Course: Vital signs in the ED notable for blood pressure in the 120s to 140 systolic.  Saturating well on room air.  Lab workup included CMP with sodium 134, glucose 172, calcium 8.5.  CBC showed hemoglobin stable at 15.7, platelets 127.  D-dimer elevated to 16.19, troponin elevated at 241 and then 295 on repeat.  BNP normal.  Lactic acid pending.  Chest x-ray showed no acute normality.  CTA PE study showed bilateral PE with saddle embolus and evidence of right heart strain with RV to LV ratio 1.2.  DVT study pending.  Patient started on heparin IV in the ED.  Critical care was consulted and recommended admission to the floor as patient remains hemodynamically stable, did recommend stat echocardiogram so patient was transferred ED to ED to facilitate this.  Review of Systems: As per HPI otherwise all other systems reviewed and are negative.  Past Medical History:  Diagnosis Date   Arthritis    Asthma    CAD in native artery 06/06/2017   Mild LAD disease on coronary CT-A 05/2017.   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   Thyroid disease    TIA (transient ischemic attack)    2017   Transaminitis 09/15/2020    Past Surgical History:  Procedure Laterality Date   ABDOMINAL HYSTERECTOMY     BLADDER SURGERY     hole in bladder from hysterectomy   BREAST SURGERY     biopsy breast benign   EYE SURGERY     bil cataract   TOTAL HIP ARTHROPLASTY Right 08/06/2019   Procedure: RIGHT TOTAL HIP ARTHROPLASTY ANTERIOR APPROACH;  Surgeon: Marcene Corning, MD;  Location: [facility];  Service: Orthopedics;  Laterality: Right;   TOTAL THYROIDECTOMY      Social History  reports that she has never smoked. She has never used smokeless tobacco. She reports current alcohol use. She reports that she does not use drugs.  Allergies  Allergen Reactions   Losartan Anaphylaxis    Diffuse hives and tongue swelling   Propantheline Rash    Cannot remember other symptoms    Atorvastatin     Myalgia    Gluten Meal Diarrhea    bloating   Rosuvastatin    Sulfa Antibiotics Swelling   Zetia [Ezetimibe]     Myalgias    Demerol [Meperidine] Rash   Morphine And  Codeine Rash    Family History  Problem Relation Age of Onset   Peripheral Artery Disease Mother    Dementia Father   Reviewed on admission  Prior to Admission medications   Medication Sig Start Date End Date Taking? Authorizing Provider  cholecalciferol (VITAMIN D) 1000 units tablet Take 1,000 Units by mouth daily.   Yes [provider]  diphenhydrAMINE (BENADRYL) 25 MG tablet Take 1 tablet (25 mg total) by mouth every 8 (eight) hours as needed for up to 21 doses for allergies or sleep. 07/07/22  Yes Terald Sleeper, MD  levothyroxine (SYNTHROID) 125 MCG tablet Take 125 mcg by mouth daily before breakfast.   Yes  [provider]  Multiple Vitamins-Minerals (MULTIVITAMIN WITH MINERALS) tablet Take 1 tablet by mouth daily.   Yes [provider]  Multiple Vitamins-Minerals (PRESERVISION AREDS 2 PO) Take 1 tablet by mouth daily.   Yes [provider]  Evolocumab (REPATHA SURECLICK) 140 MG/ML SOAJ INJECT 140 MG INTO THE SKIN EVERY 14 (FOURTEEN) DAYS. 09/26/22   Chilton Si, MD    Physical Exam: Vitals:   06/29/23 1330 06/29/23 1353 06/29/23 1400 06/29/23 1501  BP:      Pulse: 73     Resp: 12     Temp:    98.1 F (36.7 C)  TempSrc:    Oral  SpO2: 97% 97%    Weight:   68 kg   Height:   5\' 5"  (1.651 m)     Physical Exam Constitutional:      General: She is not in acute distress.    Appearance: Normal appearance.  HENT:     Head: Normocephalic and atraumatic.     Mouth/Throat:     Mouth: Mucous membranes are moist.     Pharynx: Oropharynx is clear.  Eyes:     Extraocular Movements: Extraocular movements intact.     Pupils: Pupils are equal, round, and reactive to light.  Cardiovascular:     Rate and Rhythm: Normal rate and regular rhythm.     Pulses: Normal pulses.     Heart sounds: Normal heart sounds.  Pulmonary:     Effort: Pulmonary effort is normal. No respiratory distress.     Breath sounds: Normal breath sounds.  Abdominal:     General: Bowel sounds are normal. There is no distension.     Palpations: Abdomen is soft.     Tenderness: There is no abdominal tenderness.  Musculoskeletal:        General: No swelling or deformity.     Comments: Some edema at right knee, not lower  Skin:    General: Skin is warm and dry.  Neurological:     General: No focal deficit present.     Mental Status: Mental status is at baseline.    Labs on Admission: I have personally reviewed following labs and imaging studies  CBC: Recent Labs  Lab 06/29/23 1110  WBC 6.7  NEUTROABS 4.4  HGB 15.7*  HCT 45.5  MCV 98.5  PLT 127*    Basic Metabolic  Panel: Recent Labs  Lab 06/29/23 1110  NA 134*  K 4.4  CL 101  CO2 25  GLUCOSE 172*  BUN 12  CREATININE 0.85  CALCIUM 8.5*    GFR: Estimated Creatinine Clearance: 46.7 mL/min (by C-G formula based on SCr of 0.85 mg/dL).  Liver Function Tests: Recent Labs  Lab 06/29/23 1110  AST 31  ALT 28  ALKPHOS 76  BILITOT 1.0  PROT 6.6  ALBUMIN 3.9    Urine analysis:    Component Value Date/Time   COLORURINE YELLOW 08/05/2019 0953   APPEARANCEUR CLEAR 08/05/2019 0953   LABSPEC 1.010 08/05/2019 0953   PHURINE 6.0 08/05/2019 0953   GLUCOSEU NEGATIVE 08/05/2019 0953   HGBUR NEGATIVE 08/05/2019 0953   BILIRUBINUR NEGATIVE 08/05/2019 0953   KETONESUR NEGATIVE 08/05/2019 0953   PROTEINUR NEGATIVE 08/05/2019 0953   NITRITE NEGATIVE 08/05/2019 0953   LEUKOCYTESUR NEGATIVE 08/05/2019 0953    Radiological Exams on Admission: CT Angio Chest PE W/Cm &/Or Wo Cm  Result Date: 06/29/2023 CLINICAL DATA:  Acute pulmonary embolism suspected. Positive D-dimer. Shortness of breath. EXAM: CT ANGIOGRAPHY CHEST WITH CONTRAST TECHNIQUE: Multidetector CT imaging of the chest was performed using the standard protocol during bolus administration of intravenous contrast. Multiplanar CT image reconstructions and MIPs were obtained to evaluate the vascular anatomy. RADIATION DOSE REDUCTION: This exam was performed according to the departmental dose-optimization program which includes automated exposure control, adjustment of the mA and/or kV according to patient size and/or use of iterative reconstruction technique. CONTRAST:  75mL OMNIPAQUE IOHEXOL 350 MG/ML SOLN COMPARISON:  05/29/2017. FINDINGS: Cardiovascular: Satisfactory opacification of the pulmonary arteries to the segmental level. Examination is positive for acute bilateral pulmonary emboli with saddle embolus involving the right and left main pulmonary arteries. Emboli extend into the segmental pulmonary arteries to the left upper lobe, left lower  lobe, right lower lobe and right upper lobe. Right middle lobe pulmonary emboli also noted. The RV to LV ratio is equal to 1.2 compatible with right heart strain. Reflux of contrast material into the IVC and proximal hepatic veins noted. Heart size is normal. Coronary artery calcifications. Aortic atherosclerosis. No pericardial effusion. Mediastinum/Nodes: No enlarged mediastinal, hilar, or axillary lymph nodes. Thyroid gland, trachea, and esophagus demonstrate no significant findings. Lungs/Pleura: There is no pleural effusion, airspace consolidation, or pneumothorax. Peripheral areas of subsegmental atelectasis noted in the lower lung zones. No consolidative change. Calcified granuloma identified in the left lower lobe. Upper Abdomen: No acute abnormality. Musculoskeletal: Thoracolumbar scoliosis and multilevel degenerative disc disease. No acute or suspicious osseous abnormality. No chest wall mass. Review of the MIP images confirms the above findings. IMPRESSION: 1. Examination is positive for acute bilateral pulmonary emboli with saddle embolus involving the right and left main pulmonary arteries. Emboli extend into the segmental pulmonary arteries to the bilateral upper and lower lobes and right middle lobe pulmonary arteries. 2. Positive for right heart strain (RV/LV Ratio = 1.2) the presence of right heart strain has been associated with an increased risk of morbidity and mortality. 3. Coronary artery calcifications. 4.  Aortic Atherosclerosis (ICD10-I70.0). Critical Value/emergent results were called by telephone at the time of interpretation on 06/29/2023 at 1:50 pm to provider Vanetta Mulders , who verbally acknowledged these results. Electronically Signed   By: Signa Kell M.D.   On: 06/29/2023 13:50   DG Chest 2 View  Result Date: 06/29/2023 CLINICAL DATA:  82 year old female with shortness of breath. Malaise. EXAM: CHEST - 2 VIEW COMPARISON:  Chest radiographs 08/05/2019 and earlier. FINDINGS:  PA and lateral views 1149 hours. Stable tortuosity of the thoracic aorta. Other mediastinal contours are within normal limits. Stable lung volumes. Both lungs appear clear. No pneumothorax or pleural effusion. Visualized tracheal air column is within normal limits. No acute osseous abnormality identified. Exaggerated thoracic kyphosis. Negative visible bowel gas. IMPRESSION: No acute cardiopulmonary abnormality. Electronically Signed   By: Odessa Fleming M.D.   On: 06/29/2023 13:21    EKG:  Independently reviewed.  Sinus rhythm at 82 bpm.  Nonspecific T wave flattening.  Minimal baseline artifact.  Assessment/Plan Principal Problem:   Acute pulmonary embolism (HCC) Active Problems:   History of TIA (transient ischemic attack)   Celiac disease   Dyslipidemia, goal LDL below 70   CAD in native artery   Memory loss   Acute pulmonary embolism > Patient presenting with 1 day of shortness of breath.  Worse on exertion and especially when walking up a hill that she was able to tolerate just 2 days ago. > Elevated D-dimer and troponin in the ED.  Normal BNP. > CTA showed bilateral PE with saddle embolus and evidence of right heart strain with RV to LV ratio 1.2. > Critical care consulted in the ED given above findings, the patient has remained hemodynamically stable and off oxygen so recommendation was for for admission with consultation as needed.  Patient was transferred ED to ED after accepted by hospitalist service to facilitate stat echocardiogram. - Monitor on progressive unit - Follow-up echocardiogram - Follow DVT study - Continue heparin infusion - Supportive care  Hyperlipidemia History of TIA CAD (nonobstructive by CT) - On Repatha outpatient  Hypothyroidism - Continue on Synthroid  Macular degeneration > Gets injections outpatient.  Had some bleeding in her right eye with recent injection.  Has been following up with ophthalmology outpatient for this.  History of polycythemia, celiac  disease -Noted, no current meds listed - Gluten free diet  DVT prophylaxis: Heparin Code Status:   Full  Family Communication:  Updated at bedside Disposition Plan:   Patient is from:  Home  Anticipated DC to:  Home  Anticipated DC date:  1 to 3 days  Anticipated DC barriers: None  Consults called:  None, (EDP spoke briefly with PCCM but they are not following) Admission status:  Observation, progressive  Severity of Illness: The appropriate patient status for this patient is OBSERVATION. Observation status is judged to be reasonable and necessary in order to provide the required intensity of service to ensure the patient's safety. The patient's presenting symptoms, physical exam findings, and initial radiographic and laboratory data in the context of their medical condition is felt to place them at decreased risk for further clinical deterioration. Furthermore, it is anticipated that the patient will be medically stable for discharge from the hospital within 2 midnights of admission.    Synetta Fail MD [facility]  How to contact the [facility] Attending or Consulting provider 7A - 7P or covering provider during after hours 7P -7A, for this patient?   Check the care team in [facility] and look for a) attending/consulting [facility] provider listed and b) the [facility] team listed Log into [email or web address] and use 's universal password to access. If you do not have the password, please contact the hospital operator. Locate the [facility] provider you are looking for under [facility] and page to a number that you can be directly reached. If you still have difficulty reaching the provider, please page the [facility] (Director on Call) for the Hospitalists listed on amion for assistance.  06/29/2023, 4:37 PM

## 2023-06-29 NOTE — ED Notes (Signed)
Julie [name] with cl called for transport

## 2023-06-29 NOTE — Progress Notes (Signed)
Plan of Care Note for accepted transfer  Patient: Julie [name]    UXL:[id number]  DOA: 06/29/2023     Nursing staff, Please call [facility] Admits & Consults System-Wide number on Amion as soon as patient's arrival to the unit (not the listed attending) so that the appropriate admitting provider can evaluate the pt. ASAP to avoid any delay in care.  Facility requesting transfer: [facility] Requesting Provider: Dr. Deretha Emory Reason for transfer: Admission for submassive PE Facility course: The patient presented with complaints of shortness of breath primarily on exertion ongoing for last 2 weeks.  Denies having any complaints of chest pain.  Patient is otherwise fairly active.  Workup in the ED showed that the her D-dimer was elevated and she underwent CT chest PE protocol which showed submassive saddle embolus with right heart strain.  Troponin was also elevated. PCCM was consulted but given that the patient does not have any hypoxia hypotension tachycardia or chest pain patient was felt to be stable enough to be admitted by the hospitalist service. There is a remote history of "bleeding in eye" after injection for macular degeneration in February 2024 but no other active bleeding history reported by ED provider.  (Suspect Avastin for macular degeneration). Patient was already started on IV heparin. PCCM recommended stat echocardiogram.  Plan of care: The patient is accepted for admission to Progressive unit, at [facility].  Patient will benefit from admission to [facility] in case she requires intervention by IR or vascular surgery.  Given that the recommendation from PCCM is for a stat echocardiogram and the patient is requiring progressive level of care recommended ED provider to consider ED to ED transfer to expedite the echocardiogram.  Author: Lynden Oxford, MD  06/29/2023  Check [email or web address] for on-call coverage.

## 2023-06-29 NOTE — ED Notes (Signed)
Patient ambulatory to restroom  ?

## 2023-06-29 NOTE — Consult Note (Signed)
NAME:  Julie [name], MRN:  [id number], DOB:  24-Apr-1941, LOS: 0 ADMISSION DATE:  06/29/2023, CONSULTATION DATE:  06/29/23 REFERRING MD:  EDP, CHIEF COMPLAINT:  SOB   History of Present Illness:  82 year old woman w/ hx of CAD, TIA's, polycythemia, asthma, celiac disease, HLD p/w 1 day of worsening SOB and DOE.  No hemoptysis, long car rides etc.  No history of blood clots.  Found to have saddle PE with some RV strain so PCCM consulted for additional management recommendations.  Patient is saturating well on RA with normal Bps at [facility].  On 2 L nasal cannula satting high 90s at time of evaluation.  Feeling okay.  Heart rate in the 80s.  Denies any recent surgeries.  Drove to [place] and back August 2024.  No other identifiable risk factors for provoked PE.  No history of pulmonary embolism or VTE in the past.  Pertinent  Medical History   Past Medical History:  Diagnosis Date   Arthritis    Asthma    CAD in native artery 06/06/2017   Mild LAD disease on coronary CT-A 05/2017.   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   Thyroid disease    TIA (transient ischemic attack)    2017   Transaminitis 09/15/2020     Significant Hospital Events: Including procedures, antibiotic start and stop dates in addition to other pertinent events   10/24 admit  Interim History / Subjective:    Objective   Blood pressure 129/88, pulse 73, temperature 98.1 F (36.7 C), temperature source Oral, resp. rate 12, height 5\' 5"  (1.651 m), weight 68 kg, SpO2 97%.       No intake or output data in the 24 hours ending 06/29/23 1550 Filed Weights   06/29/23 1400  Weight: 68 kg    Examination: General: Elderly, lying in bed,  no acute distress HENT: Atraumatic normocephalic moist mucous membranes Lungs: Normal work of breathing, on 2 L nasal cannula, clinic Cardiovascular: Regular rhythm no murmur Abdomen: Nondistended, bowel sounds present Extremities: No edema Neuro: No focal deficits, sensation intact  Mild trop leak CMP [facility] CBC mildly elevated Hgb, mildly decreased plts  Resolved Hospital Problem list   N/A  Assessment & Plan:  Submassive PE: class II (low risk).  Does have some markers both imaging and lab c/w RV strain.  Would further risk stratify. - Heparin x 48-72h then transition to NoAC - Get echocardiogram to truly assess for right heart strain or RV dysfunction, signs of elevated pulmonary or right ventricular pressures - No indication for advanced PE therapies at this time  Best Practice (right click and "Reselect all SmartList Selections" daily)   Per primary  Labs   CBC: Recent Labs  Lab 06/29/23 1110  WBC 6.7  NEUTROABS 4.4  HGB 15.7*  HCT 45.5  MCV 98.5  PLT 127*    Basic Metabolic Panel: Recent Labs  Lab 06/29/23 1110  NA 134*  K 4.4  CL 101  CO2 25  GLUCOSE 172*  BUN 12  CREATININE 0.85  CALCIUM 8.5*   GFR: Estimated Creatinine Clearance: 46.7 mL/min (by C-G formula based on SCr of  0.85 mg/dL). Recent Labs  Lab 06/29/23 1110  WBC 6.7    Liver Function Tests: Recent Labs  Lab 06/29/23 1110  AST 31  ALT 28  ALKPHOS 76  BILITOT 1.0  PROT 6.6  ALBUMIN 3.9   No results for input(s): "LIPASE", "AMYLASE" in the last 168 hours. No results for input(s): "AMMONIA" in the last 168 hours.  ABG    Component Value Date/Time   TCO2 27 10/05/2015 1437     Coagulation Profile: No results for input(s): "INR", "PROTIME" in the last 168 hours.  Cardiac Enzymes: No results for input(s): "CKTOTAL", "CKMB", "CKMBINDEX", "TROPONINI" in the last 168 hours.  HbA1C: Hgb A1c MFr Bld  Date/Time Value Ref Range Status  10/05/2015 01:49 PM 5.7 (H) 4.8 - 5.6 %  Final    Comment:    (NOTE)         Pre-diabetes: 5.7 - 6.4         Diabetes: >6.4         Glycemic control for adults with diabetes: <7.0     CBG: No results for input(s): "GLUCAP" in the last 168 hours.  Review of Systems:   No orthopnea or PND.  No chest pain.  Comprehensive review of systems otherwise negative.  Past Medical History:  She,  has a past medical history of Arthritis, Asthma, CAD in native artery (06/06/2017), Cancer (HCC), Celiac disease, Hyperlipidemia (05/11/2017), Hypertension, Hypothyroidism, Memory loss (10/15/2021), Polycythemia, Pure hypercholesterolemia (09/15/2020), Squamous cell carcinoma in situ (SCCIS) (02/23/2017), Statin myopathy (09/15/2020), Thyroid disease, TIA (transient ischemic attack), and Transaminitis (09/15/2020).   Surgical History:   Past Surgical History:  Procedure Laterality Date   ABDOMINAL HYSTERECTOMY     BLADDER SURGERY     hole in bladder from hysterectomy   BREAST SURGERY     biopsy breast benign   EYE SURGERY     bil cataract   TOTAL HIP ARTHROPLASTY Right 08/06/2019   Procedure: RIGHT TOTAL HIP ARTHROPLASTY ANTERIOR APPROACH;  Surgeon: Marcene Corning, MD;  Location: [facility];  Service: Orthopedics;  Laterality: Right;   TOTAL THYROIDECTOMY       Social History:   reports that she has never smoked. She has never used smokeless tobacco. She reports current alcohol use. She reports that she does not use drugs.   Family History:  Her family history includes Dementia in her father; Peripheral Artery Disease in her mother.   Allergies Allergies  Allergen Reactions   Losartan Anaphylaxis    Diffuse hives and tongue swelling   Propantheline Rash    Cannot remember other symptoms    Atorvastatin     Myalgia    Gluten Meal Diarrhea    bloating   Rosuvastatin    Sulfa Antibiotics Swelling   Zetia [Ezetimibe]     Myalgias    Demerol [Meperidine] Rash   Morphine And Codeine Rash     Home Medications  Prior to Admission  medications   Medication Sig Start Date End Date Taking? Authorizing Provider  cholecalciferol (VITAMIN D) 1000 units tablet Take 1,000 Units by mouth daily.   Yes [provider]  diphenhydrAMINE (BENADRYL) 25 MG tablet Take 1 tablet (25 mg total) by mouth every 8 (eight) hours as needed for up to 21 doses for allergies or sleep. 07/07/22  Yes Terald Sleeper, MD  levothyroxine (SYNTHROID) 125 MCG tablet Take 125 mcg by mouth daily before breakfast.   Yes [provider]  Multiple Vitamins-Minerals (MULTIVITAMIN WITH MINERALS) tablet Take 1 tablet by  mouth daily.   Yes [provider]  Multiple Vitamins-Minerals (PRESERVISION AREDS 2 PO) Take 1 tablet by mouth daily.   Yes [provider]  Evolocumab (REPATHA SURECLICK) 140 MG/ML SOAJ INJECT 140 MG INTO THE SKIN EVERY 14 (FOURTEEN) DAYS. 09/26/22   Chilton Si, MD     Critical care time: n/a    Karren Burly, MD  See Loretha Stapler

## 2023-06-29 NOTE — ED Triage Notes (Signed)
Pt is here for evaluation of sob which began this am ans was worse with activity.  Pt also states that she "does not feel right".  No CP with this.

## 2023-06-30 ENCOUNTER — Observation Stay (HOSPITAL_COMMUNITY): Payer: Medicare Other

## 2023-06-30 ENCOUNTER — Telehealth: Payer: Self-pay | Admitting: Pulmonary Disease

## 2023-06-30 DIAGNOSIS — Z8673 Personal history of transient ischemic attack (TIA), and cerebral infarction without residual deficits: Secondary | ICD-10-CM | POA: Diagnosis not present

## 2023-06-30 DIAGNOSIS — E7800 Pure hypercholesterolemia, unspecified: Secondary | ICD-10-CM | POA: Diagnosis present

## 2023-06-30 DIAGNOSIS — E871 Hypo-osmolality and hyponatremia: Secondary | ICD-10-CM | POA: Diagnosis present

## 2023-06-30 DIAGNOSIS — I700 Atherosclerosis of aorta: Secondary | ICD-10-CM | POA: Diagnosis present

## 2023-06-30 DIAGNOSIS — I82441 Acute embolism and thrombosis of right tibial vein: Secondary | ICD-10-CM | POA: Diagnosis present

## 2023-06-30 DIAGNOSIS — D696 Thrombocytopenia, unspecified: Secondary | ICD-10-CM | POA: Diagnosis present

## 2023-06-30 DIAGNOSIS — I2510 Atherosclerotic heart disease of native coronary artery without angina pectoris: Secondary | ICD-10-CM | POA: Diagnosis present

## 2023-06-30 DIAGNOSIS — Z882 Allergy status to sulfonamides status: Secondary | ICD-10-CM | POA: Diagnosis not present

## 2023-06-30 DIAGNOSIS — K900 Celiac disease: Secondary | ICD-10-CM | POA: Diagnosis present

## 2023-06-30 DIAGNOSIS — I82451 Acute embolism and thrombosis of right peroneal vein: Secondary | ICD-10-CM | POA: Diagnosis present

## 2023-06-30 DIAGNOSIS — Z79890 Hormone replacement therapy (postmenopausal): Secondary | ICD-10-CM | POA: Diagnosis not present

## 2023-06-30 DIAGNOSIS — I2602 Saddle embolus of pulmonary artery with acute cor pulmonale: Secondary | ICD-10-CM

## 2023-06-30 DIAGNOSIS — I2489 Other forms of acute ischemic heart disease: Secondary | ICD-10-CM | POA: Diagnosis present

## 2023-06-30 DIAGNOSIS — E079 Disorder of thyroid, unspecified: Secondary | ICD-10-CM | POA: Diagnosis not present

## 2023-06-30 DIAGNOSIS — Z86008 Personal history of in-situ neoplasm of other site: Secondary | ICD-10-CM | POA: Diagnosis not present

## 2023-06-30 DIAGNOSIS — I82411 Acute embolism and thrombosis of right femoral vein: Secondary | ICD-10-CM | POA: Diagnosis present

## 2023-06-30 DIAGNOSIS — J45909 Unspecified asthma, uncomplicated: Secondary | ICD-10-CM | POA: Diagnosis present

## 2023-06-30 DIAGNOSIS — R0602 Shortness of breath: Secondary | ICD-10-CM | POA: Diagnosis present

## 2023-06-30 DIAGNOSIS — E890 Postprocedural hypothyroidism: Secondary | ICD-10-CM | POA: Diagnosis present

## 2023-06-30 DIAGNOSIS — I10 Essential (primary) hypertension: Secondary | ICD-10-CM | POA: Diagnosis present

## 2023-06-30 DIAGNOSIS — I82431 Acute embolism and thrombosis of right popliteal vein: Secondary | ICD-10-CM | POA: Diagnosis present

## 2023-06-30 DIAGNOSIS — H3530 Unspecified macular degeneration: Secondary | ICD-10-CM | POA: Diagnosis present

## 2023-06-30 DIAGNOSIS — R609 Edema, unspecified: Secondary | ICD-10-CM | POA: Diagnosis not present

## 2023-06-30 DIAGNOSIS — Z8249 Family history of ischemic heart disease and other diseases of the circulatory system: Secondary | ICD-10-CM | POA: Diagnosis not present

## 2023-06-30 DIAGNOSIS — Z85828 Personal history of other malignant neoplasm of skin: Secondary | ICD-10-CM | POA: Diagnosis not present

## 2023-06-30 DIAGNOSIS — Z96641 Presence of right artificial hip joint: Secondary | ICD-10-CM | POA: Diagnosis present

## 2023-06-30 LAB — COMPREHENSIVE METABOLIC PANEL
ALT: 26 U/L (ref 0–44)
AST: 22 U/L (ref 15–41)
Albumin: 2.8 g/dL — ABNORMAL LOW (ref 3.5–5.0)
Alkaline Phosphatase: 67 U/L (ref 38–126)
Anion gap: 10 (ref 5–15)
BUN: 9 mg/dL (ref 8–23)
CO2: 25 mmol/L (ref 22–32)
Calcium: 8.2 mg/dL — ABNORMAL LOW (ref 8.9–10.3)
Chloride: 102 mmol/L (ref 98–111)
Creatinine, Ser: 0.81 mg/dL (ref 0.44–1.00)
GFR, Estimated: >60 mL/min (ref >60)
Glucose, Bld: 113 mg/dL — ABNORMAL HIGH (ref 70–99)
Potassium: 4.2 mmol/L (ref 3.5–5.1)
Sodium: 137 mmol/L (ref 135–145)
Total Bilirubin: 1 mg/dL (ref 0.3–1.2)
Total Protein: 5.5 g/dL — ABNORMAL LOW (ref 6.5–8.1)

## 2023-06-30 LAB — CBC
HCT: 41.6 % (ref 36.0–46.0)
Hemoglobin: 14.1 g/dL (ref 12.0–15.0)
MCH: 33.4 pg (ref 26.0–34.0)
MCHC: 33.9 g/dL (ref 30.0–36.0)
MCV: 98.6 fL (ref 80.0–100.0)
Platelets: 129 10*3/uL — ABNORMAL LOW (ref 150–400)
RBC: 4.22 MIL/uL (ref 3.87–5.11)
RDW: 12.9 % (ref 11.5–15.5)
WBC: 6.7 10*3/uL (ref 4.0–10.5)
nRBC: 0 % (ref 0.0–0.2)

## 2023-06-30 LAB — HEPARIN LEVEL (UNFRACTIONATED)
Heparin Unfractionated: 0.69 [IU]/mL (ref 0.30–0.70)
Heparin Unfractionated: 0.7 [IU]/mL (ref 0.30–0.70)

## 2023-06-30 NOTE — Progress Notes (Signed)
06/30/2023     Creatinine Clearance: Estimated Creatinine Clearance: 49 mL/min (by C-G formula based on SCr of 0.81 mg/dL).  No results found for this or any previous visit (from the past 240 hour(s)).    Radiology Studies: ECHOCARDIOGRAM COMPLETE  Result  Date: 06/29/2023    ECHOCARDIOGRAM REPORT   Patient Name:   Julie [name] Date of Exam: 06/29/2023 Medical Rec #:  [id number]    Height:       65.0 in Accession #:    [id number]   Weight:       150.0 lb Date of Birth:  28-Jul-1941    BSA:          1.750 m Patient Age:    82 years     BP:           129/88 mmHg Patient Gender: F            HR:           79 bpm. Exam Location:  Inpatient Procedure: 2D Echo, Cardiac Doppler and Color Doppler STAT ECHO Indications:    pulmonary embolus  History:        Patient has prior history of Echocardiogram examinations, most                 recent 10/06/2015. CAD; Risk Factors:Dyslipidemia.  Sonographer:    Delcie Roch RDCS Referring Phys: 80 SCOTT ZACKOWSKI IMPRESSIONS  1. Based on LVOT VTI cardiac output is severely reduced (2.65L/min) due to right sided failure. Left ventricular ejection fraction, by estimation, is 45 to 50%. The left ventricle has mildly decreased function. The left ventricle demonstrates global hypokinesis. Left ventricular diastolic parameters are consistent with Grade I diastolic dysfunction (impaired relaxation). There is the interventricular septum is flattened in systole, consistent with right ventricular pressure overload.  2. Free wall akinesis with tethering of the apex (McConnell's sign). Right ventricular systolic function is severely reduced. The right ventricular size is mildly enlarged. There is normal pulmonary artery systolic pressure.  3. The mitral valve is normal in structure. Trivial mitral valve regurgitation.  4. The aortic valve is normal in structure. Aortic valve regurgitation is not visualized. Conclusion(s)/Recommendation(s): Findings consistent with with significant right heart strain. FINDINGS  Left Ventricle: Based on LVOT VTI cardiac output is severely reduced (2.65L/min) due to right sided failure. Left ventricular ejection fraction, by estimation, is 45 to 50%. The left ventricle has mildly decreased function. The left  ventricle demonstrates global hypokinesis. The left ventricular internal cavity size was small. There is no left ventricular hypertrophy. The interventricular septum is flattened in systole, consistent with right ventricular pressure overload. Left ventricular diastolic parameters are consistent with Grade I diastolic dysfunction (impaired relaxation). Right Ventricle: Free wall akinesis with tethering of the apex (McConnell's sign). The right ventricular size is mildly enlarged. No increase in right ventricular wall thickness. Right ventricular systolic function is severely reduced. There is normal pulmonary artery systolic pressure. The tricuspid regurgitant velocity is 2.47 m/s, and with an assumed right atrial pressure of 3 mmHg, the estimated right ventricular systolic pressure is 27.4 mmHg. Left Atrium: Left atrial size was normal in size. Right Atrium: Right atrial size was normal in size. Pericardium: There is no evidence of pericardial effusion. Mitral Valve: The mitral valve is normal in structure. Trivial mitral valve regurgitation. Tricuspid Valve: The tricuspid valve is normal in structure. Tricuspid valve regurgitation is mild. Aortic Valve: The aortic valve is normal in structure. Aortic valve regurgitation is not visualized. Pulmonic Valve:  PROGRESS NOTE    Julie [name]  ZOX:[id number] DOB: 02/03/1941 DOA: 06/29/2023 PCP: Alysia Penna, MD   Brief Narrative: Julie [name] is a 82 y.o. female with a history of hyperlipidemia, TIA, memory loss, CAD, chronic pain, statin myopathy, hypothyroidism, polycythemia, celiac disease, macular degeneration.  Patient presented secondary to shortness of breath with exertion was found to have evidence of acute bilateral pulmonary emboli with associated right ventricular heart strain.  Patient started on heparin IV.  PCCM was consulted but recommended against ICU admission for targeted thrombolytics as patient is hemodynamically stable.   Assessment/Plan:  Acute pulmonary embolism with cor pulmonale Possibly provoked episode with recent long car ride 8 weeks prior.  CTA chest confirms evidence of acute bilateral pulmonary emboli with saddle embolus with CT evidence of right heart strain with severely reduced RV function seen on Transthoracic Echocardiogram. PCCM was consulted and recommended no ICU admission for directed thrombolytics as patient is hemodynamically stable. -Continue Heparin IV with plan to continue for 48-72 hours with transition to Eliquis -LE venous duplex  Hyponatremia Mild. Resolved.  Demand ischemia Secondary to heart strain from acute PE.   Thrombocytopenia Mild. Stable.  Hypocalcemia Secondary to albumin. Corrected calcium of 9.2 mg/dL  Hypothyroidism -Continue Synthroid 125 mcg  Hyperlipidemia History of TIA CAD Aortic atherosclerosis Patient is on Repatha as an outpatient.  Macular degeneration Patient receives ocular injections with hemorrhage after recent treatment.  Celiac disease -Continue gluten free diet   DVT prophylaxis: Heparin IV Code Status:   Code Status: Full Code Family Communication: None at bedside Disposition Plan: Discharge home likely in 2-3 days   Consultants:  PCCM  Procedures:  Transthoracic  Echocardiogram  Antimicrobials: None    Subjective: Patient reports no significant pain or dyspnea at rest but does feel chest pressure at times. Some dyspnea with exertion.  Objective: BP (!) 143/107   Pulse 85   Temp 97.8 F (36.6 C)   Resp 14   Ht 5\' 5"  (1.651 m)   Wt 68 kg   SpO2 99%   BMI 24.96 kg/m   Examination:  General exam: Appears calm and comfortable Respiratory system: Clear to auscultation. Respiratory effort normal. Cardiovascular system: S1 & S2 heard, RRR. No murmurs, rubs, gallops or clicks. 2+ pedal pulses Gastrointestinal system: Abdomen is nondistended, soft and nontender. Normal bowel sounds heard. Central nervous system: Alert and oriented. No focal neurological deficits. Musculoskeletal: No edema. No calf tenderness Skin: No cyanosis. No rashes Psychiatry: Judgement and insight appear normal. Mood & affect appropriate.    Data Reviewed: I have personally reviewed following labs and imaging studies   Last CBC Lab Results  Component Value Date   WBC 6.7 06/30/2023   HGB 14.1 06/30/2023   HCT 41.6 06/30/2023   MCV 98.6 06/30/2023   MCH 33.4 06/30/2023   RDW 12.9 06/30/2023   PLT 129 (L) 06/30/2023     Last metabolic panel Lab Results  Component Value Date   GLUCOSE 113 (H) 06/30/2023   NA 137 06/30/2023   K 4.2 06/30/2023   CL 102 06/30/2023   CO2 25 06/30/2023   BUN 9 06/30/2023   CREATININE 0.81 06/30/2023   GFRNONAA >60 06/30/2023   CALCIUM 8.2 (L) 06/30/2023   PROT 5.5 (L) 06/30/2023   ALBUMIN 2.8 (L) 06/30/2023   LABGLOB 1.8 12/29/2021   AGRATIO 2.2 12/29/2021   BILITOT 1.0 06/30/2023   ALKPHOS 67 06/30/2023   AST 22 06/30/2023   ALT 26 06/30/2023   ANIONGAP 10  06/30/2023     Creatinine Clearance: Estimated Creatinine Clearance: 49 mL/min (by C-G formula based on SCr of 0.81 mg/dL).  No results found for this or any previous visit (from the past 240 hour(s)).    Radiology Studies: ECHOCARDIOGRAM COMPLETE  Result  Date: 06/29/2023    ECHOCARDIOGRAM REPORT   Patient Name:   Julie [name] Date of Exam: 06/29/2023 Medical Rec #:  [id number]    Height:       65.0 in Accession #:    [id number]   Weight:       150.0 lb Date of Birth:  28-Jul-1941    BSA:          1.750 m Patient Age:    82 years     BP:           129/88 mmHg Patient Gender: F            HR:           79 bpm. Exam Location:  Inpatient Procedure: 2D Echo, Cardiac Doppler and Color Doppler STAT ECHO Indications:    pulmonary embolus  History:        Patient has prior history of Echocardiogram examinations, most                 recent 10/06/2015. CAD; Risk Factors:Dyslipidemia.  Sonographer:    Delcie Roch RDCS Referring Phys: 80 SCOTT ZACKOWSKI IMPRESSIONS  1. Based on LVOT VTI cardiac output is severely reduced (2.65L/min) due to right sided failure. Left ventricular ejection fraction, by estimation, is 45 to 50%. The left ventricle has mildly decreased function. The left ventricle demonstrates global hypokinesis. Left ventricular diastolic parameters are consistent with Grade I diastolic dysfunction (impaired relaxation). There is the interventricular septum is flattened in systole, consistent with right ventricular pressure overload.  2. Free wall akinesis with tethering of the apex (McConnell's sign). Right ventricular systolic function is severely reduced. The right ventricular size is mildly enlarged. There is normal pulmonary artery systolic pressure.  3. The mitral valve is normal in structure. Trivial mitral valve regurgitation.  4. The aortic valve is normal in structure. Aortic valve regurgitation is not visualized. Conclusion(s)/Recommendation(s): Findings consistent with with significant right heart strain. FINDINGS  Left Ventricle: Based on LVOT VTI cardiac output is severely reduced (2.65L/min) due to right sided failure. Left ventricular ejection fraction, by estimation, is 45 to 50%. The left ventricle has mildly decreased function. The left  ventricle demonstrates global hypokinesis. The left ventricular internal cavity size was small. There is no left ventricular hypertrophy. The interventricular septum is flattened in systole, consistent with right ventricular pressure overload. Left ventricular diastolic parameters are consistent with Grade I diastolic dysfunction (impaired relaxation). Right Ventricle: Free wall akinesis with tethering of the apex (McConnell's sign). The right ventricular size is mildly enlarged. No increase in right ventricular wall thickness. Right ventricular systolic function is severely reduced. There is normal pulmonary artery systolic pressure. The tricuspid regurgitant velocity is 2.47 m/s, and with an assumed right atrial pressure of 3 mmHg, the estimated right ventricular systolic pressure is 27.4 mmHg. Left Atrium: Left atrial size was normal in size. Right Atrium: Right atrial size was normal in size. Pericardium: There is no evidence of pericardial effusion. Mitral Valve: The mitral valve is normal in structure. Trivial mitral valve regurgitation. Tricuspid Valve: The tricuspid valve is normal in structure. Tricuspid valve regurgitation is mild. Aortic Valve: The aortic valve is normal in structure. Aortic valve regurgitation is not visualized. Pulmonic Valve:  06/30/2023     Creatinine Clearance: Estimated Creatinine Clearance: 49 mL/min (by C-G formula based on SCr of 0.81 mg/dL).  No results found for this or any previous visit (from the past 240 hour(s)).    Radiology Studies: ECHOCARDIOGRAM COMPLETE  Result  Date: 06/29/2023    ECHOCARDIOGRAM REPORT   Patient Name:   Julie [name] Date of Exam: 06/29/2023 Medical Rec #:  [id number]    Height:       65.0 in Accession #:    [id number]   Weight:       150.0 lb Date of Birth:  28-Jul-1941    BSA:          1.750 m Patient Age:    82 years     BP:           129/88 mmHg Patient Gender: F            HR:           79 bpm. Exam Location:  Inpatient Procedure: 2D Echo, Cardiac Doppler and Color Doppler STAT ECHO Indications:    pulmonary embolus  History:        Patient has prior history of Echocardiogram examinations, most                 recent 10/06/2015. CAD; Risk Factors:Dyslipidemia.  Sonographer:    Delcie Roch RDCS Referring Phys: 80 SCOTT ZACKOWSKI IMPRESSIONS  1. Based on LVOT VTI cardiac output is severely reduced (2.65L/min) due to right sided failure. Left ventricular ejection fraction, by estimation, is 45 to 50%. The left ventricle has mildly decreased function. The left ventricle demonstrates global hypokinesis. Left ventricular diastolic parameters are consistent with Grade I diastolic dysfunction (impaired relaxation). There is the interventricular septum is flattened in systole, consistent with right ventricular pressure overload.  2. Free wall akinesis with tethering of the apex (McConnell's sign). Right ventricular systolic function is severely reduced. The right ventricular size is mildly enlarged. There is normal pulmonary artery systolic pressure.  3. The mitral valve is normal in structure. Trivial mitral valve regurgitation.  4. The aortic valve is normal in structure. Aortic valve regurgitation is not visualized. Conclusion(s)/Recommendation(s): Findings consistent with with significant right heart strain. FINDINGS  Left Ventricle: Based on LVOT VTI cardiac output is severely reduced (2.65L/min) due to right sided failure. Left ventricular ejection fraction, by estimation, is 45 to 50%. The left ventricle has mildly decreased function. The left  ventricle demonstrates global hypokinesis. The left ventricular internal cavity size was small. There is no left ventricular hypertrophy. The interventricular septum is flattened in systole, consistent with right ventricular pressure overload. Left ventricular diastolic parameters are consistent with Grade I diastolic dysfunction (impaired relaxation). Right Ventricle: Free wall akinesis with tethering of the apex (McConnell's sign). The right ventricular size is mildly enlarged. No increase in right ventricular wall thickness. Right ventricular systolic function is severely reduced. There is normal pulmonary artery systolic pressure. The tricuspid regurgitant velocity is 2.47 m/s, and with an assumed right atrial pressure of 3 mmHg, the estimated right ventricular systolic pressure is 27.4 mmHg. Left Atrium: Left atrial size was normal in size. Right Atrium: Right atrial size was normal in size. Pericardium: There is no evidence of pericardial effusion. Mitral Valve: The mitral valve is normal in structure. Trivial mitral valve regurgitation. Tricuspid Valve: The tricuspid valve is normal in structure. Tricuspid valve regurgitation is mild. Aortic Valve: The aortic valve is normal in structure. Aortic valve regurgitation is not visualized. Pulmonic Valve:

## 2023-06-30 NOTE — Progress Notes (Signed)
NAME:  Julie [name], MRN:  [id number], DOB:  10-05-1940, LOS: 0 ADMISSION DATE:  06/29/2023, CONSULTATION DATE:  06/29/23 REFERRING MD:  EDP, CHIEF COMPLAINT:  SOB   History of Present Illness:  82 year old woman w/ hx of CAD, TIA's, polycythemia, asthma, celiac disease, HLD p/w 1 day of worsening SOB and DOE.  No hemoptysis, long car rides etc.  No history of blood clots.  Found to have saddle PE with some RV strain so PCCM consulted for additional management recommendations.  Patient is saturating well on RA with normal Bps at [facility].  On 2 L nasal cannula satting high 90s at time of evaluation.  Feeling okay.  Heart rate in the 80s.  Denies any recent surgeries.  Drove to [place] and back August 2024.  No other identifiable risk factors for provoked PE.  No history of pulmonary embolism or VTE in the past.  Pertinent  Medical History   Past Medical History:  Diagnosis Date   Arthritis    Asthma    CAD in native artery 06/06/2017   Mild LAD disease on coronary CT-A 05/2017.   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   Thyroid disease    TIA (transient ischemic attack)    2017   Transaminitis 09/15/2020     Significant Hospital Events: Including procedures, antibiotic start and stop dates in addition to other pertinent events   10/24 admit 10/24 2d  1. Based on LVOT VTI cardiac output is severely reduced (2.65L/min) due  to right sided failure. Left ventricular ejection fraction, by estimation,  is 45 to 50%. The left ventricle has mildly decreased function. The left  ventricle demonstrates global  hypokinesis. Left ventricular diastolic parameters are consistent with  Grade I diastolic dysfunction  (impaired relaxation). There is the  interventricular septum is flattened in systole, consistent with right  ventricular pressure overload.   2. Free wall akinesis with tethering of the apex (McConnell's sign).  Right ventricular systolic function is severely reduced. The right  ventricular size is mildly enlarged. There is normal pulmonary artery  systolic pressure.   3. The mitral valve is normal in structure. Trivial mitral valve  regurgitation.   4. The aortic valve is normal in structure. Aortic valve regurgitation is  not visualized.   Conclusion(s)/Recommendation(s): Findings consistent with with significant  right heart stra   Interim History / Subjective:  Reports feeling great.  Objective   Blood pressure (!) 122/93, pulse 78, temperature 97.8 F (36.6 C), resp. rate 17, height 5\' 5"  (1.651 m), weight 68 kg, SpO2 97%.        Intake/Output Summary (Last 24 hours) at 06/30/2023 0834 Last data filed at 06/30/2023 0009 Gross per 24 hour  Intake 142.99 ml  Output --  Net 142.99 ml   Filed Weights   06/29/23 1400  Weight: 68 kg    Examination: Elderly female sitting up eating awake alert No JVD or lymphadenopathy is appreciated Decreased breath sounds in the bases Heart sounds are regular Abdomen soft nontender positive bowel sounds Right lower extremity mild edema  Resolved Hospital Problem list   N/A  Assessment & Plan:  Submassive  PE: class II (low risk).  Does have some markers both imaging and lab c/w RV strain.  Would further risk stratify.  Heparin for 8 to 72 hours he had transition to p.o. anticoagulants To do echo has been performed and read by physician.  There is no changes that would require invasive intervention at this time. Follow-up with pulmonary as outpatient     Best Practice (right click and "Reselect all SmartList Selections" daily)   Per primary  Labs   CBC: Recent Labs  Lab 06/29/23 1110 06/30/23 0438  WBC 6.7 6.7   NEUTROABS 4.4  --   HGB 15.7* 14.1  HCT 45.5 41.6  MCV 98.5 98.6  PLT 127* 129*    Basic Metabolic Panel: Recent Labs  Lab 06/29/23 1110 06/30/23 0640  NA 134* 137  K 4.4 4.2  CL 101 102  CO2 25 25  GLUCOSE 172* 113*  BUN 12 9  CREATININE 0.85 0.81  CALCIUM 8.5* 8.2*   GFR: Estimated Creatinine Clearance: 49 mL/min (by C-G formula based on SCr of 0.81 mg/dL). Recent Labs  Lab 06/29/23 1110 06/29/23 1601 06/30/23 0438  WBC 6.7  --  6.7  LATICACIDVEN  --  1.1  --     Liver Function Tests: Recent Labs  Lab 06/29/23 1110 06/30/23 0640  AST 31 22  ALT 28 26  ALKPHOS 76 67  BILITOT 1.0 1.0  PROT 6.6 5.5*  ALBUMIN 3.9 2.8*   No results for input(s): "LIPASE", "AMYLASE" in the last 168 hours. No results for input(s): "AMMONIA" in the last 168 hours.  ABG    Component Value Date/Time   TCO2 27 10/05/2015 1437     Coagulation Profile: No results for input(s): "INR", "PROTIME" in the last 168 hours.  Cardiac Enzymes: No results for input(s): "CKTOTAL", "CKMB", "CKMBINDEX", "TROPONINI" in the last 168 hours.  HbA1C: Hgb A1c MFr Bld  Date/Time Value Ref Range Status  10/05/2015 01:49 PM 5.7 (H) 4.8 - 5.6 % Final    Comment:    (NOTE)         Pre-diabetes: 5.7 - 6.4         Diabetes: >6.4         Glycemic control for adults with diabetes: <7.0     CBG: No results for input(s): "GLUCAP" in the last 168 hours.  Brett Canales Rafik Koppel ACNP Acute Care Nurse Practitioner [facility] Please consult Amion 06/30/2023, 8:35 AM

## 2023-06-30 NOTE — Progress Notes (Signed)
PHARMACY - ANTICOAGULATION CONSULT NOTE  Pharmacy Consult for heparin Indication: pulmonary embolus  Allergies  Allergen Reactions   Losartan Anaphylaxis    Diffuse hives and tongue swelling   Propantheline Rash    Cannot remember other symptoms    Atorvastatin     Myalgia    Gluten Meal Diarrhea    bloating   Rosuvastatin    Sulfa Antibiotics Swelling   Zetia [Ezetimibe]     Myalgias    Demerol [Meperidine] Rash   Morphine And Codeine Rash    Patient Measurements: Height: 5\' 5"  (165.1 cm) Weight: 68 kg (150 lb) IBW/kg (Calculated) : 57 Heparin Dosing Weight: 68kg  Vital Signs: Temp: 97.9 F (36.6 C) (10/24 2157) Temp Source: Oral (10/24 2157) BP: 144/85 (10/24 2157) Pulse Rate: 76 (10/25 0006)  Labs: Recent Labs    06/29/23 1110 06/29/23 1147 06/29/23 1405 06/29/23 2349  HGB 15.7*  --   --   --   HCT 45.5  --   --   --   PLT 127*  --   --   --   HEPARINUNFRC  --   --   --  0.70  CREATININE 0.85  --   --   --   TROPONINIHS  --  241* 295*  --     Estimated Creatinine Clearance: 46.7 mL/min (by C-G formula based on SCr of 0.85 mg/dL).   Medical History: Past Medical History:  Diagnosis Date   Arthritis    Asthma    CAD in native artery 06/06/2017   Mild LAD disease on coronary CT-A 05/2017.   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   Thyroid disease    TIA (transient ischemic attack)    2017   Transaminitis 09/15/2020    Medications:  Infusions:   heparin 1,200 Units/hr (06/30/23 0009)   Assessment: 82 yof presented to the ED with SOB. Found to have a bilateral saddle PE with right heart strain. No anticoagulation prior to admission. Baseline Hgb is WNL but platelets are low.  Pharmacy consulted to start IV heparin.   Heparin level: 0.7, therapeutic  Heparin infusion running appropriately at bedside. No overt s/sx bleeding.   Goal of Therapy:  Heparin level 0.3-0.7 units/ml Monitor platelets by anticoagulation protocol: Yes   Plan:  Continue Heparin gtt 1200 units/hr Check an 8 hr heparin level Daily heparin level and CBC F/u plans for anticoagulation   [place] Sarinana S Taji Sather 06/30/2023,12:56 AM

## 2023-06-30 NOTE — ED Notes (Signed)
Per provider approval, Pt ambulated around the nurses station independently and without the need for 02.

## 2023-06-30 NOTE — Progress Notes (Signed)
PHARMACY - ANTICOAGULATION CONSULT NOTE  Pharmacy Consult for heparin Indication: pulmonary embolus  Allergies  Allergen Reactions   Losartan Anaphylaxis    Diffuse hives and tongue swelling   Propantheline Rash    Cannot remember other symptoms    Atorvastatin     Myalgia    Gluten Meal Diarrhea    bloating   Rosuvastatin    Sulfa Antibiotics Swelling   Zetia [Ezetimibe]     Myalgias    Demerol [Meperidine] Rash   Morphine And Codeine Rash    Patient Measurements: Height: 5\' 5"  (165.1 cm) Weight: 68 kg (150 lb) IBW/kg (Calculated) : 57 Heparin Dosing Weight: 68kg  Vital Signs: Temp: 97.7 F (36.5 C) (10/25 1024) Temp Source: Oral (10/25 1024) BP: 138/85 (10/25 1010) Pulse Rate: 69 (10/25 1010)  Labs: Recent Labs    06/29/23 1110 06/29/23 1147 06/29/23 1405 06/29/23 2349 06/30/23 0438 06/30/23 0640 06/30/23 1009  HGB 15.7*  --   --   --  14.1  --   --   HCT 45.5  --   --   --  41.6  --   --   PLT 127*  --   --   --  129*  --   --   HEPARINUNFRC  --   --   --  0.70  --   --  0.69  CREATININE 0.85  --   --   --   --  0.81  --   TROPONINIHS  --  241* 295*  --   --   --   --     Estimated Creatinine Clearance: 49 mL/min (by C-G formula based on SCr of 0.81 mg/dL).   Medical History: Past Medical History:  Diagnosis Date   Arthritis    Asthma    CAD in native artery 06/06/2017   Mild LAD disease on coronary CT-A 05/2017.   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 Found to  have a bilateral saddle PE with right heart strain. No anticoagulation prior to admission. Baseline Hgb is WNL but platelets are low. Pharmacy consulted to start IV heparin.   Heparin level remains upper end therapeutic on 1200 units/hr, no invasive intervention planned  Goal of Therapy:  Heparin level 0.3-0.7 units/ml Monitor platelets by anticoagulation protocol: Yes   Plan:  Decrease heparin gtt slightly to 1150 units/hr Daily heparin level, CBC, s/s bleeding F/u long term [facility] plan  Daylene Posey, PharmD, [facility] Clinical Pharmacist ED Pharmacist Phone # [phone] 06/30/2023 11:04 AM

## 2023-06-30 NOTE — Evaluation (Signed)
Physical Therapy Evaluation Patient Details Name: Julie [name] MRN: [id number] DOB: 09-Nov-1940 Today's Date: 06/30/2023  History of Present Illness  Pt is and 82 y/o female presenting with SOB and found by CTA to have bil PE's.  PMHx:  tia, memory loss, CAD, chronic pain, statin myopathy, celiac ds, macular degeneration.  Clinical Impression  Pt is at or close to baseline functioning and should be safe at home with PRN assist from her partner. There are no further acute PT needs.  Will sign off at this time.         If plan is discharge home, recommend the following: Assistance with cooking/housework   Can travel by private vehicle        Equipment Recommendations None recommended by PT  Recommendations for Other Services       Functional Status Assessment Patient has had a recent decline in their functional status and demonstrates the ability to make significant improvements in function in a reasonable and predictable amount of time.     Precautions / Restrictions Precautions Precautions: None      Mobility  Bed Mobility Overal bed mobility: Modified Independent                  Transfers Overall transfer level: Modified independent                      Ambulation/Gait Ambulation/Gait assistance: Modified independent (Device/Increase time) Gait Distance (Feet): 240 Feet Assistive device: None Gait Pattern/deviations: Step-through pattern   Gait velocity interpretation: 1.31 - 2.62 ft/sec, indicative of limited community ambulator   General Gait Details: pt's gait generally steady with moderate speed, able to abruptly change direction.  Stairs Stairs: Yes Stairs assistance: Modified independent (Device/Increase time) Stair Management: One rail Right, Alternating pattern, Forwards Number of Stairs: 3 General stair comments: safe with the rails  Wheelchair Mobility     Tilt Bed    Modified Rankin (Stroke Patients Only)       Balance  Overall balance assessment: No apparent balance deficits (not formally assessed)                                           Pertinent Vitals/Pain Pain Assessment Pain Assessment: No/denies pain    Home Living Family/patient expects to be discharged to:: Private residence Living Arrangements: Spouse/significant other Available Help at Discharge: Family;Available 24 hours/day;Available PRN/intermittently Type of Home: House Home Access: Level entry       Home Layout: One level Home Equipment: Agricultural consultant (2 wheels);Rollator (4 wheels);Cane - single point      Prior Function Prior Level of Function : Independent/Modified Independent                     Extremity/Trunk Assessment   Upper Extremity Assessment Upper Extremity Assessment: Overall WFL for tasks assessed    Lower Extremity Assessment Lower Extremity Assessment: Overall WFL for tasks assessed    Cervical / Trunk Assessment Cervical / Trunk Assessment: Normal  Communication   Communication Communication: No apparent difficulties  Cognition Arousal: Alert Behavior During Therapy: WFL for tasks assessed/performed Overall Cognitive Status: Within Functional Limits for tasks assessed  General Comments General comments (skin integrity, edema, etc.): pt experienced some tiredness during gait, did not feel SOB, good color and response to activity, but sats from finger nail bed ran low at 85-88% on RA until pt came to rest and then jumped up to >90% fairly quickly.    Exercises     Assessment/Plan    PT Assessment Patient does not need any further PT services  PT Problem List         PT Treatment Interventions      PT Goals (Current goals can be found in the Care Plan section)  Acute Rehab PT Goals Patient Stated Goal: home independent PT Goal Formulation: All assessment and education complete, DC therapy    Frequency        Co-evaluation               AM-PAC PT "6 Clicks" Mobility  Outcome Measure Help needed turning from your back to your side while in a flat bed without using bedrails?: None Help needed moving from lying on your back to sitting on the side of a flat bed without using bedrails?: None Help needed moving to and from a bed to a chair (including a wheelchair)?: None Help needed standing up from a chair using your arms (e.g., wheelchair or bedside chair)?: None Help needed to walk in hospital room?: A Little Help needed climbing 3-5 steps with a railing? : A Little 6 Click Score: 22    End of Session   Activity Tolerance: Patient tolerated treatment well Patient left: in bed;with call bell/phone within reach (eating at EOB) Nurse Communication: Mobility status PT Visit Diagnosis: Difficulty in walking, not elsewhere classified (R26.2)    Time: 1840-1900 PT Time Calculation (min) (ACUTE ONLY): 20 min   Charges:   PT Evaluation $PT Eval Low Complexity: 1 Low   PT General Charges $$ ACUTE PT VISIT: 1 Visit         06/30/2023  Jacinto Halim., PT Acute Rehabilitation Services [phone]  (office)  Eliseo Gum Ardyth Kelso 06/30/2023, 7:11 PM

## 2023-06-30 NOTE — Progress Notes (Signed)
Bilateral venous duplex has been completed. Positive results under CV Proc.  [employer], RVT.

## 2023-06-30 NOTE — Discharge Instructions (Signed)
You will follow-up with [facility] for the pulmonary embolism.  Dr. Judeth Horn saw you in the hospital.  We are working on arranging appointments.  Please contact the front desk at [phone] with any questions or concerns.

## 2023-06-30 NOTE — ED Notes (Signed)
ED TO INPATIENT HANDOFF REPORT  ED Nurse Name and Phone #: Lenell Antu Name/Age/Gender Julie [name] 82 y.o. female Room/Bed: 010C/010C  Code Status   Code Status: Full Code  Home/SNF/Other Home Patient oriented to: self, place, time, and situation Is this baseline? Yes   Triage Complete: Triage complete  Chief Complaint Acute pulmonary embolism (HCC) [I26.99]  Triage Note Pt is here for evaluation of sob which began this am ans was worse with activity.  Pt also states that she "does not feel right".  No CP with this.   Patient bib [facility] as a [facility] with Saddle bag pulmonary embolism with other scattered pulmonary emobolis with right heart strain.  On arrival to ed heparin going at 1200 units a hour, had a 4000 unit bolus at 1424. VSS with [facility], A&Ox4.    Allergies Allergies  Allergen Reactions   Losartan Anaphylaxis    Diffuse hives and tongue swelling   Propantheline Rash    Cannot remember other symptoms    Atorvastatin     Myalgia    Gluten Meal Diarrhea    bloating   Rosuvastatin    Sulfa Antibiotics Swelling   Zetia [Ezetimibe]     Myalgias    Demerol [Meperidine] Rash   Morphine And Codeine Rash    Level of Care/Admitting Diagnosis ED Disposition     ED Disposition  Admit   Condition  --   Comment  Hospital Area: [facility] [[id number]]  Level of Care: Progressive [102]  Admit to Progressive based on following criteria: CARDIOVASCULAR & THORACIC of moderate stability with acute coronary syndrome symptoms/low risk myocardial infarction/hypertensive urgency/arrhythmias/heart failure potentially compromising stability and stable post cardiovascular intervention patients.  Admit to Progressive based on following criteria: Other see comments  Comments: Saddle PE, PCCM rec progressive as patient is stable  May place patient in observation at [facility] or [facility] if equivalent level of care is available:: No  Covid  Evaluation: Asymptomatic - no recent exposure (last 10 days) testing not required  Diagnosis: Acute pulmonary embolism [facility]) [[id number]]  Admitting Physician: Synetta Fail [[id number]]  Attending Physician: Synetta Fail [[id number]]          B Medical/Surgery History Past Medical History:  Diagnosis Date   Arthritis    Asthma    CAD in native artery 06/06/2017   Mild LAD disease on coronary CT-A 05/2017.   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   Procedure: RIGHT TOTAL HIP ARTHROPLASTY ANTERIOR APPROACH;  Surgeon: Marcene Corning, MD;  Location: [facility];  Service: Orthopedics;  Laterality: Right;   TOTAL THYROIDECTOMY       A IV Location/Drains/Wounds Patient Lines/Drains/Airways Status     Active  Line/Drains/Airways     Name Placement date Placement time Site Days   Peripheral IV 06/29/23 20 G Right Antecubital 06/29/23  1109  Antecubital  1   Peripheral IV 06/30/23 20 G 1" Anterior;Left Forearm 06/30/23  0000  Forearm  less than 1   Incision (Closed) 08/06/19 Hip Right 08/06/19  1131  -- 1424            Intake/Output Last 24 hours  Intake/Output Summary (Last 24 hours) at 06/30/2023 1057 Last data filed at 06/30/2023 0009 Gross per 24 hour  Intake 142.99 ml  Output --  Net 142.99 ml    Labs/Imaging Results for orders placed or performed during  the hospital encounter of 06/29/23 (from the past 48 hour(s))  CBC with Differential/Platelet     Status: Abnormal   Collection Time: 06/29/23 11:10 AM  Result Value Ref Range   WBC 6.7 4.0 - 10.5 K/uL   RBC 4.62 3.87 - 5.11 MIL/uL   Hemoglobin 15.7 (H) 12.0 - 15.0 g/dL   HCT 21.3 08.6 - 57.8 %   MCV 98.5 80.0 - 100.0 fL   MCH 34.0 26.0 - 34.0 pg   MCHC 34.5 30.0 - 36.0 g/dL   RDW 46.9 62.9 - 52.8 %   Platelets 127 (L) 150 - 400 K/uL   nRBC 0.0 0.0 - 0.2 %   Neutrophils Relative % 66 %   Neutro Abs 4.4 1.7 - 7.7 K/uL   Lymphocytes Relative 25 %   Lymphs Abs 1.6 0.7 - 4.0 K/uL   Monocytes Relative 7 %   Monocytes Absolute 0.5 0.1 - 1.0 K/uL   Eosinophils Relative 2 %   Eosinophils Absolute 0.1 0.0 - 0.5 K/uL   Basophils Relative 0 %   Basophils Absolute 0.0 0.0 - 0.1 K/uL   Immature Granulocytes 0 %   Abs Immature Granulocytes 0.02 0.00 - 0.07 K/uL    Comment: Performed at [facility], [address], [place], [place] [address]  Comprehensive metabolic panel     Status: Abnormal   Collection Time: 06/29/23 11:10 AM  Result Value Ref Range   Sodium 134 (L) 135 - 145 mmol/L   Potassium 4.4 3.5 - 5.1 mmol/L   Chloride 101 98 - 111 mmol/L   CO2 25 22 - 32 mmol/L   Glucose, Bld 172 (H) 70 - 99 mg/dL    Comment: Glucose reference range applies only to samples taken after fasting for at least 8 hours.   BUN 12 8 - 23 mg/dL   Creatinine, Ser 4.01 0.44 - 1.00 mg/dL   Calcium 8.5 (L) 8.9 - 10.3 mg/dL   Total Protein 6.6 6.5 - 8.1 g/dL   Albumin 3.9 3.5 - 5.0 g/dL   AST 31 15 - 41 U/L   ALT 28 0 - 44 U/L   Alkaline Phosphatase 76 38 - 126 U/L   Total Bilirubin 1.0 0.3 - 1.2 mg/dL   GFR, Estimated >02 >72 mL/min    Comment: (NOTE) Calculated using the CKD-EPI Creatinine Equation (2021)    Anion gap 8 5 - 15    Comment: Performed at [facility], [address], [place], [place] [address]  D-dimer, quantitative     Status: Abnormal    Collection Time: 06/29/23 11:10 AM  Result Value Ref Range   D-Dimer, Quant 16.19 (H) 0.00 - 0.50 ug/mL-FEU    Comment: (NOTE) At the manufacturer cut-off value of 0.5 g/mL FEU, this assay has a negative predictive value of 95-100%.This assay  is intended for use in conjunction with a clinical pretest probability (PTP) assessment model to exclude pulmonary embolism (PE) and deep venous thrombosis (DVT) in outpatients suspected of PE or DVT. Results should be correlated with clinical presentation. Performed at [facility], [address] [place], [place] [address]   Troponin I (High Sensitivity)     Status: Abnormal   Collection Time: 06/29/23 11:47 AM  Result Value Ref Range   Troponin I (High Sensitivity) 241 (HH) <18 ng/L    Comment: CRITICAL RESULT CALLED TO, READ BACK BY AND VERIFIED WITH: Vladimir Faster, RN 1243 06/29/2023 DBRADLEY (NOTE) Elevated high sensitivity troponin I (hsTnI) values and significant  changes across serial measurements may suggest ACS but many other  chronic and acute conditions are known to elevate hsTnI results.  Refer to the Links section for chest pain algorithms and additional  guidance. Performed at [facility], [address] [place] [address], [place], [place] [address]   Brain natriuretic peptide     Status: None   Collection Time: 06/29/23 11:48 AM  Result Value Ref Range   B Natriuretic Peptide 81.9 0.0 - 100.0 pg/mL    Comment: Performed at [facility], [address] [place], [place] [address]  Troponin I (High Sensitivity)     Status: Abnormal   Collection Time: 06/29/23  2:05 PM  Result Value Ref Range   Troponin I (High Sensitivity) 295 (HH) <18 ng/L    Comment: DELTA CHECK NOTED CRITICAL VALUE NOTED.  VALUE IS CONSISTENT WITH PREVIOUSLY REPORTED AND CALLED VALUE. (NOTE) Elevated high sensitivity troponin I (hsTnI) values and significant  changes across serial measurements may suggest ACS but  many other  chronic and acute conditions are known to elevate hsTnI results.  Refer to the Links section for chest pain algorithms and additional  guidance. Performed at [facility], [address] [place], [place] [address]   Lactic acid, plasma     Status: None   Collection Time: 06/29/23  4:01 PM  Result Value Ref Range   Lactic Acid, Venous 1.1 0.5 - 1.9 mmol/L    Comment: Performed at [facility], [address], [place], [place] [address]  Heparin level (unfractionated)     Status: None   Collection Time: 06/29/23 11:49 PM  Result Value Ref Range   Heparin Unfractionated 0.70 0.30 - 0.70 IU/mL    Comment: (NOTE) The clinical reportable range upper limit is being lowered to >1.10 to align with the FDA approved guidance for the current laboratory assay.  If heparin results are below expected values, and patient dosage has  been confirmed, suggest follow up testing of antithrombin III levels. Performed at [facility], [address], [place], [place] [address]   CBC     Status: Abnormal   Collection Time: 06/30/23  4:38 AM  Result Value Ref Range   WBC 6.7 4.0 - 10.5 K/uL   RBC 4.22 3.87 - 5.11 MIL/uL   Hemoglobin 14.1 12.0 - 15.0 g/dL   HCT 74.2 59.5 - 63.8 %   MCV 98.6 80.0 - 100.0 fL   MCH 33.4 26.0 - 34.0 pg   MCHC 33.9 30.0 - 36.0 g/dL   RDW 75.6 43.3 - 29.5 %   Platelets 129 (L) 150 - 400 K/uL    Comment: REPEATED TO VERIFY   nRBC 0.0 0.0 - 0.2 %    Comment: Performed at [facility], [address] [place], [place] [address]  Comprehensive metabolic  panel     Status: Abnormal   Collection Time: 06/30/23  6:40 AM  Result Value Ref Range   Sodium 137 135 - 145 mmol/L   Potassium 4.2 3.5 - 5.1 mmol/L   Chloride 102 98 - 111 mmol/L   CO2 25 22 - 32 mmol/L   Glucose, Bld 113 (H) 70 - 99 mg/dL    Comment: Glucose reference range applies only to samples taken after fasting for at least 8 hours.   BUN 9 8 - 23 mg/dL    Creatinine, Ser 0.86 0.44 - 1.00 mg/dL   Calcium 8.2 (L) 8.9 - 10.3 mg/dL   Total Protein 5.5 (L) 6.5 - 8.1 g/dL   Albumin 2.8 (L) 3.5 - 5.0 g/dL   AST 22 15 - 41 U/L   ALT 26 0 - 44 U/L   Alkaline Phosphatase 67 38 - 126 U/L   Total Bilirubin 1.0 0.3 - 1.2 mg/dL   GFR, Estimated >57 >84 mL/min    Comment: (NOTE) Calculated using the CKD-EPI Creatinine Equation (2021)    Anion gap 10 5 - 15    Comment: Performed at [facility], [address] [place], [place] [address]  Heparin level (unfractionated)     Status: None   Collection Time: 06/30/23 10:09 AM  Result Value Ref Range   Heparin Unfractionated 0.69 0.30 - 0.70 IU/mL    Comment: (NOTE) The clinical reportable range upper limit is being lowered to >1.10 to align with the FDA approved guidance for the current laboratory assay.  If heparin results are below expected values, and patient dosage has  been confirmed, suggest follow up testing of antithrombin III levels. Performed at [facility], [address], [place], [place] [address]    ECHOCARDIOGRAM COMPLETE  Result Date: 06/29/2023    ECHOCARDIOGRAM REPORT   Patient Name:   Julie [name] Date of Exam: 06/29/2023 Medical Rec #:  [id number]    Height:       65.0 in Accession #:    [id number]   Weight:       150.0 lb Date of Birth:  1941/08/14    BSA:          1.750 m Patient Age:    82 years     BP:           129/88 mmHg Patient Gender: F            HR:           79 bpm. Exam Location:  Inpatient Procedure: 2D Echo, Cardiac Doppler and Color Doppler STAT ECHO Indications:    pulmonary embolus  History:        Patient has prior history of Echocardiogram examinations, most                 recent 10/06/2015. CAD; Risk Factors:Dyslipidemia.  Sonographer:    Delcie Roch RDCS Referring Phys: [id number] SCOTT ZACKOWSKI IMPRESSIONS  1. Based on LVOT VTI cardiac output is severely reduced (2.65L/min) due to right sided failure. Left ventricular ejection fraction, by  estimation, is 45 to 50%. The left ventricle has mildly decreased function. The left ventricle demonstrates global hypokinesis. Left ventricular diastolic parameters are consistent with Grade I diastolic dysfunction (impaired relaxation). There is the interventricular septum is flattened in systole, consistent with right ventricular pressure overload.  2. Free wall akinesis with tethering of the apex (McConnell's sign). Right ventricular systolic function is severely reduced. The right ventricular size is mildly enlarged.  There is normal pulmonary artery systolic pressure.  3. The mitral valve is normal in structure. Trivial mitral valve regurgitation.  4. The aortic valve is normal in structure. Aortic valve regurgitation is not visualized. Conclusion(s)/Recommendation(s): Findings consistent with with significant right heart strain. FINDINGS  Left Ventricle: Based on LVOT VTI cardiac output is severely reduced (2.65L/min) due to right sided failure. Left ventricular ejection fraction, by estimation, is 45 to 50%. The left ventricle has mildly decreased function. The left ventricle demonstrates global hypokinesis. The left ventricular internal cavity size was small. There is no left ventricular hypertrophy. The interventricular septum is flattened in systole, consistent with right ventricular pressure overload. Left ventricular diastolic parameters are consistent with Grade I diastolic dysfunction (impaired relaxation). Right Ventricle: Free wall akinesis with tethering of the apex (McConnell's sign). The right ventricular size is mildly enlarged. No increase in right ventricular wall thickness. Right ventricular systolic function is severely reduced. There is normal pulmonary artery systolic pressure. The tricuspid regurgitant velocity is 2.47 m/s, and with an assumed right atrial pressure of 3 mmHg, the estimated right ventricular systolic pressure is 27.4 mmHg. Left Atrium: Left atrial size was normal in size.  Right Atrium: Right atrial size was normal in size. Pericardium: There is no evidence of pericardial effusion. Mitral Valve: The mitral valve is normal in structure. Trivial mitral valve regurgitation. Tricuspid Valve: The tricuspid valve is normal in structure. Tricuspid valve regurgitation is mild. Aortic Valve: The aortic valve is normal in structure. Aortic valve regurgitation is not visualized. Pulmonic Valve: The pulmonic valve was normal in structure. Pulmonic valve regurgitation is trivial. Aorta: The aortic root and ascending aorta are structurally normal, with no evidence of dilitation. Venous: The inferior vena cava was not well visualized. IAS/Shunts: The interatrial septum was not well visualized.  LEFT VENTRICLE PLAX 2D LVIDd:         3.90 cm     Diastology LVIDs:         2.70 cm     LV e' medial:    3.81 cm/s LV PW:         0.90 cm     LV E/e' medial:  8.2 LV IVS:        1.00 cm     LV e' lateral:   5.87 cm/s LVOT diam:     1.90 cm     LV E/e' lateral: 5.3 LV SV:         34 LV SV Index:   20 LVOT Area:     2.84 cm  LV Volumes (MOD) LV vol d, MOD A2C: 52.1 ml LV vol d, MOD A4C: 51.1 ml LV vol s, MOD A2C: 31.5 ml LV vol s, MOD A4C: 26.3 ml LV SV MOD A2C:     20.6 ml LV SV MOD A4C:     51.1 ml LV SV MOD BP:      24.1 ml RIGHT VENTRICLE            IVC RV Basal diam:  2.20 cm    IVC diam: 1.30 cm RV S prime:     7.40 cm/s TAPSE (M-mode): 0.7 cm LEFT ATRIUM             Index        RIGHT ATRIUM           Index LA diam:        3.50 cm 2.00 cm/m   RA Area:     11.40 cm LA Vol (A2C):  31.3 ml 17.88 ml/m  RA Volume:   22.20 ml  12.68 ml/m LA Vol (A4C):   45.0 ml 25.71 ml/m LA Biplane Vol: 40.3 ml 23.02 ml/m  AORTIC VALVE LVOT Vmax:   73.10 cm/s LVOT Vmean:  45.300 cm/s LVOT VTI:    0.121 m  AORTA Ao Root diam: 3.10 cm Ao Asc diam:  3.60 cm MV E velocity: 31.10 cm/s  TRICUSPID VALVE MV A velocity: 74.40 cm/s  TR Peak grad:   24.4 mmHg MV E/A ratio:  0.42        TR Vmax:        247.00 cm/s                              SHUNTS                            Systemic VTI:  0.12 m                            Systemic Diam: 1.90 cm Clearnce Hasten Electronically signed by Clearnce Hasten Signature Date/Time: 06/29/2023/5:37:03 PM    Final    CT Angio Chest PE W/Cm &/Or Wo Cm  Result Date: 06/29/2023 CLINICAL DATA:  Acute pulmonary embolism suspected. Positive D-dimer. Shortness of breath. EXAM: CT ANGIOGRAPHY CHEST WITH CONTRAST TECHNIQUE: Multidetector CT imaging of the chest was performed using the standard protocol during bolus administration of intravenous contrast. Multiplanar CT image reconstructions and MIPs were obtained to evaluate the vascular anatomy. RADIATION DOSE REDUCTION: This exam was performed according to the departmental dose-optimization program which includes automated exposure control, adjustment of the mA and/or kV according to patient size and/or use of iterative reconstruction technique. CONTRAST:  75mL OMNIPAQUE IOHEXOL 350 MG/ML SOLN COMPARISON:  05/29/2017. FINDINGS: Cardiovascular: Satisfactory opacification of the pulmonary arteries to the segmental level. Examination is positive for acute bilateral pulmonary emboli with saddle embolus involving the right and left main pulmonary arteries. Emboli extend into the segmental pulmonary arteries to the left upper lobe, left lower lobe, right lower lobe and right upper lobe. Right middle lobe pulmonary emboli also noted. The RV to LV ratio is equal to 1.2 compatible with right heart strain. Reflux of contrast material into the IVC and proximal hepatic veins noted. Heart size is normal. Coronary artery calcifications. Aortic atherosclerosis. No pericardial effusion. Mediastinum/Nodes: No enlarged mediastinal, hilar, or axillary lymph nodes. Thyroid gland, trachea, and esophagus demonstrate no significant findings. Lungs/Pleura: There is no pleural effusion, airspace consolidation, or pneumothorax. Peripheral areas of subsegmental atelectasis noted in  the lower lung zones. No consolidative change. Calcified granuloma identified in the left lower lobe. Upper Abdomen: No acute abnormality. Musculoskeletal: Thoracolumbar scoliosis and multilevel degenerative disc disease. No acute or suspicious osseous abnormality. No chest wall mass. Review of the MIP images confirms the above findings. IMPRESSION: 1. Examination is positive for acute bilateral pulmonary emboli with saddle embolus involving the right and left main pulmonary arteries. Emboli extend into the segmental pulmonary arteries to the bilateral upper and lower lobes and right middle lobe pulmonary arteries. 2. Positive for right heart strain (RV/LV Ratio = 1.2) the presence of right heart strain has been associated with an increased risk of morbidity and mortality. 3. Coronary artery calcifications. 4.  Aortic Atherosclerosis (ICD10-I70.0). Critical Value/emergent results were called by telephone at the time of interpretation on 06/29/2023 at 1:50  pm to provider Vanetta Mulders , who verbally acknowledged these results. Electronically Signed   By: Signa Kell M.D.   On: 06/29/2023 13:50   DG Chest 2 View  Result Date: 06/29/2023 CLINICAL DATA:  82 year old female with shortness of breath. Malaise. EXAM: CHEST - 2 VIEW COMPARISON:  Chest radiographs 08/05/2019 and earlier. FINDINGS: PA and lateral views 1149 hours. Stable tortuosity of the thoracic aorta. Other mediastinal contours are within normal limits. Stable lung volumes. Both lungs appear clear. No pneumothorax or pleural effusion. Visualized tracheal air column is within normal limits. No acute osseous abnormality identified. Exaggerated thoracic kyphosis. Negative visible bowel gas. IMPRESSION: No acute cardiopulmonary abnormality. Electronically Signed   By: Odessa Fleming M.D.   On: 06/29/2023 13:21    Pending Labs Unresulted Labs (From admission, onward)     Start     Ordered   07/01/23 0500  Heparin level (unfractionated)  Daily,   R       06/30/23 0103   06/30/23 0500  CBC  Daily,   R      06/29/23 1649            Vitals/Pain Today's Vitals   06/30/23 0557 06/30/23 0626 06/30/23 0800 06/30/23 1024  BP:   (!) 143/107   Pulse:   85   Resp:   14   Temp:    97.7 F (36.5 C)  TempSrc:    Oral  SpO2:   99%   Weight:      Height:      PainSc: 0-No pain 0-No pain      Isolation Precautions No active isolations  Medications Medications  heparin ADULT infusion 100 units/mL (25000 units/233mL) (1,200 Units/hr Intravenous New Bag/Given 06/30/23 0910)  levothyroxine (SYNTHROID) tablet 125 mcg (125 mcg Oral Given 06/30/23 0547)  sodium chloride flush (NS) 0.9 % injection 3 mL (3 mLs Intravenous Given 06/30/23 0000)  acetaminophen (TYLENOL) tablet 650 mg (has no administration in time range)    Or  acetaminophen (TYLENOL) suppository 650 mg (has no administration in time range)  polyethylene glycol (MIRALAX / GLYCOLAX) packet 17 g (has no administration in time range)  iohexol (OMNIPAQUE) 350 MG/ML injection 100 mL (75 mLs Intravenous Contrast Given 06/29/23 1324)  heparin bolus via infusion 4,000 Units (4,000 Units Intravenous Bolus from Bag 06/29/23 1424)    Mobility walks     Focused Assessments Pulmonary Assessment Handoff:  Lung sounds: Bilateral Breath Sounds: Clear L Breath Sounds: Clear R Breath Sounds: Clear O2 Device: Room Air      R Recommendations: See Admitting Provider Note  Report given to:   Additional Notes:

## 2023-06-30 NOTE — Progress Notes (Signed)
Pt arrived from ED, alert and pleasant with no compaints.  Hep gtt running at 12, changed to 11.5 per order.  Difficulty getting BP as Pt experiences a lot of discomfort with this.  Came with lunch tray however it had to be reordered as Pt has celiac and the fish was breaded.  Pt oriented to unit and equipment, plan of care reviewed.

## 2023-06-30 NOTE — Telephone Encounter (Signed)
PCCM staff called today requesting follow-up.  He was told an appointment be made with [name], NP for submassive PE.  Short duration follow-up to ensure medication availability.  In addition, please schedule follow-up visit with me in January 2025 after echocardiogram (ordered in this encounter) for follow-up of RV dysfunction. Thanks!

## 2023-06-30 NOTE — ED Notes (Signed)
Patient transported to Ultrasound 

## 2023-07-01 DIAGNOSIS — I2510 Atherosclerotic heart disease of native coronary artery without angina pectoris: Secondary | ICD-10-CM | POA: Diagnosis not present

## 2023-07-01 DIAGNOSIS — I2602 Saddle embolus of pulmonary artery with acute cor pulmonale: Secondary | ICD-10-CM | POA: Diagnosis not present

## 2023-07-01 DIAGNOSIS — E079 Disorder of thyroid, unspecified: Secondary | ICD-10-CM | POA: Diagnosis not present

## 2023-07-01 DIAGNOSIS — Z8673 Personal history of transient ischemic attack (TIA), and cerebral infarction without residual deficits: Secondary | ICD-10-CM | POA: Diagnosis not present

## 2023-07-01 LAB — CBC
HCT: 40.2 % (ref 36.0–46.0)
Hemoglobin: 13.9 g/dL (ref 12.0–15.0)
MCH: 33.2 pg (ref 26.0–34.0)
MCHC: 34.6 g/dL (ref 30.0–36.0)
MCV: 95.9 fL (ref 80.0–100.0)
Platelets: 137 10*3/uL — ABNORMAL LOW (ref 150–400)
RBC: 4.19 MIL/uL (ref 3.87–5.11)
RDW: 12.7 % (ref 11.5–15.5)
WBC: 7 10*3/uL (ref 4.0–10.5)
nRBC: 0 % (ref 0.0–0.2)

## 2023-07-01 LAB — HEPARIN LEVEL (UNFRACTIONATED): Heparin Unfractionated: 0.65 [IU]/mL (ref 0.30–0.70)

## 2023-07-01 NOTE — Plan of Care (Signed)

## 2023-07-01 NOTE — Progress Notes (Signed)
PHARMACY - ANTICOAGULATION CONSULT NOTE  Pharmacy Consult for heparin Indication: pulmonary embolus  Allergies  Allergen Reactions   Losartan Anaphylaxis    Diffuse hives and tongue swelling   Propantheline Rash    Cannot remember other symptoms    Atorvastatin     Myalgia    Gluten Meal Diarrhea    bloating   Rosuvastatin    Sulfa Antibiotics Swelling   Zetia [Ezetimibe]     Myalgias    Demerol [Meperidine] Rash   Morphine And Codeine Rash    Patient Measurements: Height: 5\' 5"  (165.1 cm) Weight: 68 kg (150 lb) IBW/kg (Calculated) : 57 Heparin Dosing Weight: 68kg  Vital Signs: Temp: 97.8 F (36.6 C) (10/26 0903) Temp Source: Oral (10/26 0903) BP: 129/84 (10/26 0903) Pulse Rate: 74 (10/26 0903)  Labs: Recent Labs    06/29/23 1110 06/29/23 1147 06/29/23 1405 06/29/23 2349 06/30/23 0438 06/30/23 0640 06/30/23 1009 07/01/23 0303  HGB 15.7*  --   --   --  14.1  --   --  13.9  HCT 45.5  --   --   --  41.6  --   --  40.2  PLT 127*  --   --   --  129*  --   --  137*  HEPARINUNFRC  --   --   --  0.70  --   --  0.69 0.65  CREATININE 0.85  --   --   --   --  0.81  --   --   TROPONINIHS  --  241* 295*  --   --   --   --   --     Estimated Creatinine Clearance: 49 mL/min (by C-G formula based on SCr of 0.81 mg/dL).   Medical History: Past Medical History:  Diagnosis Date   Arthritis    Asthma    CAD in native artery 06/06/2017   Mild LAD disease on coronary CT-A 05/2017.   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   Thyroid disease    TIA (transient ischemic attack)    2017   Transaminitis 09/15/2020    Medications:  Infusions:   heparin 1,150 Units/hr (07/01/23 0826)    Assessment: 82 yof presented to the ED with SOB. Found to have a bilateral saddle PE with right heart strain. No anticoagulation prior to admission. Baseline Hgb is WNL but platelets are low. Pharmacy consulted to start IV heparin.   Heparin level is therapeutic (0.65) on 1150 units/hr with stable CBC. No issues with infusion or s/sx of bleeding per RN.  Goal of Therapy:  Heparin level 0.3-0.7 units/ml Monitor platelets by anticoagulation protocol: Yes   Plan:  Continue heparin gtt at 1150 units/hr Daily heparin level, CBC, s/s bleeding F/u long term AC plan, will reach out to MD tomorrow if has not been transitioned to oral [facility]  Nicole Kindred, PharmD PGY1 Pharmacy Resident 07/01/2023 11:39 AM

## 2023-07-01 NOTE — Progress Notes (Signed)
PROGRESS NOTE    Julie [name]  ZOX:[id number] DOB: 28-May-1941 DOA: 06/29/2023 PCP: Alysia Penna, MD   Brief Narrative: Julie [name] is a 82 y.o. female with a history of hyperlipidemia, TIA, memory loss, CAD, chronic pain, statin myopathy, hypothyroidism, polycythemia, celiac disease, macular degeneration.  Patient presented secondary to shortness of breath with exertion was found to have evidence of acute bilateral pulmonary emboli with associated right ventricular heart strain.  Patient started on heparin IV.  PCCM was consulted but recommended against ICU admission for targeted thrombolytics as patient is hemodynamically stable.   Assessment/Plan:  Acute pulmonary embolism with cor pulmonale Possibly provoked episode with recent long car ride 8 weeks prior.  CTA chest confirms evidence of acute bilateral pulmonary emboli with saddle embolus with CT evidence of right heart strain with severely reduced RV function seen on Transthoracic Echocardiogram. PCCM was consulted and recommended no ICU admission for directed thrombolytics as patient is hemodynamically stable. -Continue Heparin IV with plan to continue for 24-48 hours with transition to Eliquis; anticipate transition to Eliquis tomorrow  Right DVT DVT involving right femoral vein, right popliteal vein, right posterior tibial veins and right peroneal veins.  Hyponatremia Mild. Resolved.  Demand ischemia Secondary to heart strain from acute PE.   Thrombocytopenia Mild. Stable.  Hypocalcemia Secondary to albumin. Corrected calcium of 9.2 mg/dL  Hypothyroidism -Continue Synthroid 125 mcg  Hyperlipidemia History of TIA CAD Aortic atherosclerosis Patient is on Repatha as an outpatient.  Macular degeneration Patient receives ocular injections with hemorrhage after recent treatment.  Celiac disease -Continue gluten free diet   DVT prophylaxis: Heparin IV Code Status:   Code Status: Full Code Family  Communication: Partner at bedside Disposition Plan: Discharge home likely in 1-2 days once transitioned to oral anticoagulation   Consultants:  PCCM  Procedures:  Transthoracic Echocardiogram  Antimicrobials: None    Subjective: Dyspnea on exertion and chest pressure are improved today.  Objective: BP 129/84 (BP Location: Right Arm)   Pulse 74   Temp 97.8 F (36.6 C) (Oral)   Resp 19   Ht 5\' 5"  (1.651 m)   Wt 68 kg   SpO2 93%   BMI 24.96 kg/m   Examination:  General exam: Appears calm and comfortable Respiratory system: Clear to auscultation. Respiratory effort normal. Cardiovascular system: S1 & S2 heard, RRR. No murmurs. Gastrointestinal system: Abdomen is nondistended, soft and nontender. Normal bowel sounds heard. Central nervous system: Alert and oriented. No focal neurological deficits. Musculoskeletal: No edema. No calf tenderness Skin: No cyanosis. No rashes Psychiatry: Judgement and insight appear normal. Mood & affect appropriate.    Data Reviewed: I have personally reviewed following labs and imaging studies   Last CBC Lab Results  Component Value Date   WBC 7.0 07/01/2023   HGB 13.9 07/01/2023   HCT 40.2 07/01/2023   MCV 95.9 07/01/2023   MCH 33.2 07/01/2023   RDW 12.7 07/01/2023   PLT 137 (L) 07/01/2023     Last metabolic panel Lab Results  Component Value Date   GLUCOSE 113 (H) 06/30/2023   NA 137 06/30/2023   K 4.2 06/30/2023   CL 102 06/30/2023   CO2 25 06/30/2023   BUN 9 06/30/2023   CREATININE 0.81 06/30/2023   GFRNONAA >60 06/30/2023   CALCIUM 8.2 (L) 06/30/2023   PROT 5.5 (L) 06/30/2023   ALBUMIN 2.8 (L) 06/30/2023   LABGLOB 1.8 12/29/2021   AGRATIO 2.2 12/29/2021   BILITOT 1.0 06/30/2023   ALKPHOS 67 06/30/2023  AST 22 06/30/2023   ALT 26 06/30/2023   ANIONGAP 10 06/30/2023     Creatinine Clearance: Estimated Creatinine Clearance: 49 mL/min (by C-G formula based on SCr of 0.81 mg/dL).  No results found for this or  any previous visit (from the past 240 hour(s)).    Radiology Studies: VAS [place] LOWER EXTREMITY VENOUS (DVT)  Result Date: 06/30/2023  Lower Venous DVT Study Patient Name:  Julie [name]  Date of Exam:   06/30/2023 Medical Rec #: [id number]     Accession #:    [id number] Date of Birth: 22-Feb-1941     Patient Gender: F Patient Age:   19 years Exam Location:  [facility] Procedure:      VAS [place] LOWER EXTREMITY VENOUS (DVT) Referring Phys: Lyn Hollingshead MELVIN --------------------------------------------------------------------------------  Indications: Pain, Swelling, and pulmonary embolism.  Risk Factors: Confirmed PE. Comparison Study: No prior Performing Technologist: Marilynne Halsted RDMS, RVT  Examination Guidelines: A complete evaluation includes B-mode imaging, spectral Doppler, color Doppler, and power Doppler as needed of all accessible portions of each vessel. Bilateral testing is considered an integral part of a complete examination. Limited examinations for reoccurring indications may be performed as noted. The reflux portion of the exam is performed with the patient in reverse Trendelenburg.  +---------+---------------+---------+-----------+----------+-----------------+ RIGHT    CompressibilityPhasicitySpontaneityPropertiesThrombus Aging    +---------+---------------+---------+-----------+----------+-----------------+ CFV      Full           Yes      Yes                                    +---------+---------------+---------+-----------+----------+-----------------+ SFJ      Full                                                           +---------+---------------+---------+-----------+----------+-----------------+ FV Prox  Full                                                           +---------+---------------+---------+-----------+----------+-----------------+ FV Mid   Full                                                            +---------+---------------+---------+-----------+----------+-----------------+ FV DistalNone           No       No                   Age Indeterminate +---------+---------------+---------+-----------+----------+-----------------+ PFV      Full                                                           +---------+---------------+---------+-----------+----------+-----------------+ POP      None  POP      Full           Yes      Yes                                 +---------+---------------+---------+-----------+----------+--------------+ PTV      Full                                                        +---------+---------------+---------+-----------+----------+--------------+ PERO     Full                                                        +---------+---------------+---------+-----------+----------+--------------+     Summary: RIGHT: - Findings consistent with age indeterminate deep vein thrombosis involving the right femoral vein, right popliteal vein, right posterior tibial veins, and right peroneal veins. - Findings consistent with age indeterminate superficial vein thrombosis involving the right great saphenous vein. Findings consistent with age indeterminate intramuscular thrombus involving the right gastrocnemius veins. - No cystic structure found in the popliteal fossa.  LEFT: - There is no evidence of deep vein thrombosis in the lower extremity.  - No cystic structure found in the popliteal fossa.  *See table(s) above for measurements and observations. Electronically signed by Gerarda Fraction on 06/30/2023 at 5:01:00 PM.    Final    ECHOCARDIOGRAM COMPLETE  Result Date: 06/29/2023    ECHOCARDIOGRAM REPORT   Patient Name:   Julie [name] Date of Exam: 06/29/2023 Medical Rec #:  [id number]    Height:       65.0 in Accession #:    [id number]   Weight:       150.0 lb Date of Birth:  [birth year]    BSA:          1.750 m Patient Age:    82 years     BP:           129/88  mmHg Patient Gender: F            HR:           79 bpm. Exam Location:  Inpatient Procedure: 2D Echo, Cardiac Doppler and Color Doppler STAT ECHO Indications:    pulmonary embolus  History:        Patient has prior history of Echocardiogram examinations, most                 recent 10/06/2015. CAD; Risk Factors:Dyslipidemia.  Sonographer:    Delcie Roch RDCS Referring Phys: 76 SCOTT ZACKOWSKI IMPRESSIONS  1. Based on LVOT VTI cardiac output is severely reduced (2.65L/min) due to right sided failure. Left ventricular ejection fraction, by estimation, is 45 to 50%. The left ventricle has mildly decreased function. The left ventricle demonstrates global hypokinesis. Left ventricular diastolic parameters are consistent with Grade I diastolic dysfunction (impaired relaxation). There is the interventricular septum is flattened in systole, consistent with right ventricular pressure overload.  2. Free wall akinesis with tethering of the apex (McConnell's sign). Right ventricular systolic function is severely reduced. The right ventricular size is mildly enlarged. There is normal pulmonary artery  POP      Full           Yes      Yes                                 +---------+---------------+---------+-----------+----------+--------------+ PTV      Full                                                        +---------+---------------+---------+-----------+----------+--------------+ PERO     Full                                                        +---------+---------------+---------+-----------+----------+--------------+     Summary: RIGHT: - Findings consistent with age indeterminate deep vein thrombosis involving the right femoral vein, right popliteal vein, right posterior tibial veins, and right peroneal veins. - Findings consistent with age indeterminate superficial vein thrombosis involving the right great saphenous vein. Findings consistent with age indeterminate intramuscular thrombus involving the right gastrocnemius veins. - No cystic structure found in the popliteal fossa.  LEFT: - There is no evidence of deep vein thrombosis in the lower extremity.  - No cystic structure found in the popliteal fossa.  *See table(s) above for measurements and observations. Electronically signed by Gerarda Fraction on 06/30/2023 at 5:01:00 PM.    Final    ECHOCARDIOGRAM COMPLETE  Result Date: 06/29/2023    ECHOCARDIOGRAM REPORT   Patient Name:   Julie [name] Date of Exam: 06/29/2023 Medical Rec #:  [id number]    Height:       65.0 in Accession #:    [id number]   Weight:       150.0 lb Date of Birth:  [birth year]    BSA:          1.750 m Patient Age:    82 years     BP:           129/88  mmHg Patient Gender: F            HR:           79 bpm. Exam Location:  Inpatient Procedure: 2D Echo, Cardiac Doppler and Color Doppler STAT ECHO Indications:    pulmonary embolus  History:        Patient has prior history of Echocardiogram examinations, most                 recent 10/06/2015. CAD; Risk Factors:Dyslipidemia.  Sonographer:    Delcie Roch RDCS Referring Phys: 76 SCOTT ZACKOWSKI IMPRESSIONS  1. Based on LVOT VTI cardiac output is severely reduced (2.65L/min) due to right sided failure. Left ventricular ejection fraction, by estimation, is 45 to 50%. The left ventricle has mildly decreased function. The left ventricle demonstrates global hypokinesis. Left ventricular diastolic parameters are consistent with Grade I diastolic dysfunction (impaired relaxation). There is the interventricular septum is flattened in systole, consistent with right ventricular pressure overload.  2. Free wall akinesis with tethering of the apex (McConnell's sign). Right ventricular systolic function is severely reduced. The right ventricular size is mildly enlarged. There is normal pulmonary artery  PROGRESS NOTE    Julie [name]  ZOX:[id number] DOB: 28-May-1941 DOA: 06/29/2023 PCP: Alysia Penna, MD   Brief Narrative: Julie [name] is a 82 y.o. female with a history of hyperlipidemia, TIA, memory loss, CAD, chronic pain, statin myopathy, hypothyroidism, polycythemia, celiac disease, macular degeneration.  Patient presented secondary to shortness of breath with exertion was found to have evidence of acute bilateral pulmonary emboli with associated right ventricular heart strain.  Patient started on heparin IV.  PCCM was consulted but recommended against ICU admission for targeted thrombolytics as patient is hemodynamically stable.   Assessment/Plan:  Acute pulmonary embolism with cor pulmonale Possibly provoked episode with recent long car ride 8 weeks prior.  CTA chest confirms evidence of acute bilateral pulmonary emboli with saddle embolus with CT evidence of right heart strain with severely reduced RV function seen on Transthoracic Echocardiogram. PCCM was consulted and recommended no ICU admission for directed thrombolytics as patient is hemodynamically stable. -Continue Heparin IV with plan to continue for 24-48 hours with transition to Eliquis; anticipate transition to Eliquis tomorrow  Right DVT DVT involving right femoral vein, right popliteal vein, right posterior tibial veins and right peroneal veins.  Hyponatremia Mild. Resolved.  Demand ischemia Secondary to heart strain from acute PE.   Thrombocytopenia Mild. Stable.  Hypocalcemia Secondary to albumin. Corrected calcium of 9.2 mg/dL  Hypothyroidism -Continue Synthroid 125 mcg  Hyperlipidemia History of TIA CAD Aortic atherosclerosis Patient is on Repatha as an outpatient.  Macular degeneration Patient receives ocular injections with hemorrhage after recent treatment.  Celiac disease -Continue gluten free diet   DVT prophylaxis: Heparin IV Code Status:   Code Status: Full Code Family  Communication: Partner at bedside Disposition Plan: Discharge home likely in 1-2 days once transitioned to oral anticoagulation   Consultants:  PCCM  Procedures:  Transthoracic Echocardiogram  Antimicrobials: None    Subjective: Dyspnea on exertion and chest pressure are improved today.  Objective: BP 129/84 (BP Location: Right Arm)   Pulse 74   Temp 97.8 F (36.6 C) (Oral)   Resp 19   Ht 5\' 5"  (1.651 m)   Wt 68 kg   SpO2 93%   BMI 24.96 kg/m   Examination:  General exam: Appears calm and comfortable Respiratory system: Clear to auscultation. Respiratory effort normal. Cardiovascular system: S1 & S2 heard, RRR. No murmurs. Gastrointestinal system: Abdomen is nondistended, soft and nontender. Normal bowel sounds heard. Central nervous system: Alert and oriented. No focal neurological deficits. Musculoskeletal: No edema. No calf tenderness Skin: No cyanosis. No rashes Psychiatry: Judgement and insight appear normal. Mood & affect appropriate.    Data Reviewed: I have personally reviewed following labs and imaging studies   Last CBC Lab Results  Component Value Date   WBC 7.0 07/01/2023   HGB 13.9 07/01/2023   HCT 40.2 07/01/2023   MCV 95.9 07/01/2023   MCH 33.2 07/01/2023   RDW 12.7 07/01/2023   PLT 137 (L) 07/01/2023     Last metabolic panel Lab Results  Component Value Date   GLUCOSE 113 (H) 06/30/2023   NA 137 06/30/2023   K 4.2 06/30/2023   CL 102 06/30/2023   CO2 25 06/30/2023   BUN 9 06/30/2023   CREATININE 0.81 06/30/2023   GFRNONAA >60 06/30/2023   CALCIUM 8.2 (L) 06/30/2023   PROT 5.5 (L) 06/30/2023   ALBUMIN 2.8 (L) 06/30/2023   LABGLOB 1.8 12/29/2021   AGRATIO 2.2 12/29/2021   BILITOT 1.0 06/30/2023   ALKPHOS 67 06/30/2023  POP      Full           Yes      Yes                                 +---------+---------------+---------+-----------+----------+--------------+ PTV      Full                                                        +---------+---------------+---------+-----------+----------+--------------+ PERO     Full                                                        +---------+---------------+---------+-----------+----------+--------------+     Summary: RIGHT: - Findings consistent with age indeterminate deep vein thrombosis involving the right femoral vein, right popliteal vein, right posterior tibial veins, and right peroneal veins. - Findings consistent with age indeterminate superficial vein thrombosis involving the right great saphenous vein. Findings consistent with age indeterminate intramuscular thrombus involving the right gastrocnemius veins. - No cystic structure found in the popliteal fossa.  LEFT: - There is no evidence of deep vein thrombosis in the lower extremity.  - No cystic structure found in the popliteal fossa.  *See table(s) above for measurements and observations. Electronically signed by Gerarda Fraction on 06/30/2023 at 5:01:00 PM.    Final    ECHOCARDIOGRAM COMPLETE  Result Date: 06/29/2023    ECHOCARDIOGRAM REPORT   Patient Name:   Julie [name] Date of Exam: 06/29/2023 Medical Rec #:  [id number]    Height:       65.0 in Accession #:    [id number]   Weight:       150.0 lb Date of Birth:  [birth year]    BSA:          1.750 m Patient Age:    82 years     BP:           129/88  mmHg Patient Gender: F            HR:           79 bpm. Exam Location:  Inpatient Procedure: 2D Echo, Cardiac Doppler and Color Doppler STAT ECHO Indications:    pulmonary embolus  History:        Patient has prior history of Echocardiogram examinations, most                 recent 10/06/2015. CAD; Risk Factors:Dyslipidemia.  Sonographer:    Delcie Roch RDCS Referring Phys: 76 SCOTT ZACKOWSKI IMPRESSIONS  1. Based on LVOT VTI cardiac output is severely reduced (2.65L/min) due to right sided failure. Left ventricular ejection fraction, by estimation, is 45 to 50%. The left ventricle has mildly decreased function. The left ventricle demonstrates global hypokinesis. Left ventricular diastolic parameters are consistent with Grade I diastolic dysfunction (impaired relaxation). There is the interventricular septum is flattened in systole, consistent with right ventricular pressure overload.  2. Free wall akinesis with tethering of the apex (McConnell's sign). Right ventricular systolic function is severely reduced. The right ventricular size is mildly enlarged. There is normal pulmonary artery  PROGRESS NOTE    Julie [name]  ZOX:[id number] DOB: 28-May-1941 DOA: 06/29/2023 PCP: Alysia Penna, MD   Brief Narrative: Julie [name] is a 82 y.o. female with a history of hyperlipidemia, TIA, memory loss, CAD, chronic pain, statin myopathy, hypothyroidism, polycythemia, celiac disease, macular degeneration.  Patient presented secondary to shortness of breath with exertion was found to have evidence of acute bilateral pulmonary emboli with associated right ventricular heart strain.  Patient started on heparin IV.  PCCM was consulted but recommended against ICU admission for targeted thrombolytics as patient is hemodynamically stable.   Assessment/Plan:  Acute pulmonary embolism with cor pulmonale Possibly provoked episode with recent long car ride 8 weeks prior.  CTA chest confirms evidence of acute bilateral pulmonary emboli with saddle embolus with CT evidence of right heart strain with severely reduced RV function seen on Transthoracic Echocardiogram. PCCM was consulted and recommended no ICU admission for directed thrombolytics as patient is hemodynamically stable. -Continue Heparin IV with plan to continue for 24-48 hours with transition to Eliquis; anticipate transition to Eliquis tomorrow  Right DVT DVT involving right femoral vein, right popliteal vein, right posterior tibial veins and right peroneal veins.  Hyponatremia Mild. Resolved.  Demand ischemia Secondary to heart strain from acute PE.   Thrombocytopenia Mild. Stable.  Hypocalcemia Secondary to albumin. Corrected calcium of 9.2 mg/dL  Hypothyroidism -Continue Synthroid 125 mcg  Hyperlipidemia History of TIA CAD Aortic atherosclerosis Patient is on Repatha as an outpatient.  Macular degeneration Patient receives ocular injections with hemorrhage after recent treatment.  Celiac disease -Continue gluten free diet   DVT prophylaxis: Heparin IV Code Status:   Code Status: Full Code Family  Communication: Partner at bedside Disposition Plan: Discharge home likely in 1-2 days once transitioned to oral anticoagulation   Consultants:  PCCM  Procedures:  Transthoracic Echocardiogram  Antimicrobials: None    Subjective: Dyspnea on exertion and chest pressure are improved today.  Objective: BP 129/84 (BP Location: Right Arm)   Pulse 74   Temp 97.8 F (36.6 C) (Oral)   Resp 19   Ht 5\' 5"  (1.651 m)   Wt 68 kg   SpO2 93%   BMI 24.96 kg/m   Examination:  General exam: Appears calm and comfortable Respiratory system: Clear to auscultation. Respiratory effort normal. Cardiovascular system: S1 & S2 heard, RRR. No murmurs. Gastrointestinal system: Abdomen is nondistended, soft and nontender. Normal bowel sounds heard. Central nervous system: Alert and oriented. No focal neurological deficits. Musculoskeletal: No edema. No calf tenderness Skin: No cyanosis. No rashes Psychiatry: Judgement and insight appear normal. Mood & affect appropriate.    Data Reviewed: I have personally reviewed following labs and imaging studies   Last CBC Lab Results  Component Value Date   WBC 7.0 07/01/2023   HGB 13.9 07/01/2023   HCT 40.2 07/01/2023   MCV 95.9 07/01/2023   MCH 33.2 07/01/2023   RDW 12.7 07/01/2023   PLT 137 (L) 07/01/2023     Last metabolic panel Lab Results  Component Value Date   GLUCOSE 113 (H) 06/30/2023   NA 137 06/30/2023   K 4.2 06/30/2023   CL 102 06/30/2023   CO2 25 06/30/2023   BUN 9 06/30/2023   CREATININE 0.81 06/30/2023   GFRNONAA >60 06/30/2023   CALCIUM 8.2 (L) 06/30/2023   PROT 5.5 (L) 06/30/2023   ALBUMIN 2.8 (L) 06/30/2023   LABGLOB 1.8 12/29/2021   AGRATIO 2.2 12/29/2021   BILITOT 1.0 06/30/2023   ALKPHOS 67 06/30/2023

## 2023-07-02 DIAGNOSIS — I2602 Saddle embolus of pulmonary artery with acute cor pulmonale: Secondary | ICD-10-CM | POA: Diagnosis not present

## 2023-07-02 LAB — CBC
HCT: 38.9 % (ref 36.0–46.0)
Hemoglobin: 13.4 g/dL (ref 12.0–15.0)
MCH: 33.3 pg (ref 26.0–34.0)
MCHC: 34.4 g/dL (ref 30.0–36.0)
MCV: 96.5 fL (ref 80.0–100.0)
Platelets: 149 10*3/uL — ABNORMAL LOW (ref 150–400)
RBC: 4.03 MIL/uL (ref 3.87–5.11)
RDW: 12.5 % (ref 11.5–15.5)
WBC: 5.6 10*3/uL (ref 4.0–10.5)
nRBC: 0 % (ref 0.0–0.2)

## 2023-07-02 LAB — HEPARIN LEVEL (UNFRACTIONATED): Heparin Unfractionated: 0.52 [IU]/mL (ref 0.30–0.70)

## 2023-07-02 MED ORDER — APIXABAN 5 MG PO TABS
10.0000 mg | ORAL_TABLET | Freq: Two times a day (BID) | ORAL | Status: DC
Start: 2023-07-02 — End: 2023-07-02
  Administered 2023-07-02: 10 mg via ORAL
  Filled 2023-07-02: qty 2

## 2023-07-02 MED ORDER — APIXABAN 5 MG PO TABS: 5.0000 mg | ORAL_TABLET | Freq: Two times a day (BID) | ORAL | Status: DC

## 2023-07-02 MED ORDER — APIXABAN 5 MG PO TABS
ORAL_TABLET | ORAL | 0 refills | Status: DC
  Filled 2023-07-02: qty 194, 90d supply, fill #0

## 2023-07-02 MED ORDER — APIXABAN 5 MG PO TABS: ORAL_TABLET | ORAL | 0 refills | Status: DC

## 2023-07-02 NOTE — Progress Notes (Signed)
Pt discharged home with SO.  All instructions given and reviewed, all questions answered.

## 2023-07-02 NOTE — Discharge Summary (Signed)
FV Mid   Full                                                        +---------+---------------+---------+-----------+----------+--------------+ FV DistalFull                                                        +---------+---------------+---------+-----------+----------+--------------+ PFV      Full                                                        +---------+---------------+---------+-----------+----------+--------------+ POP      Full           Yes      Yes                                 +---------+---------------+---------+-----------+----------+--------------+ PTV      Full                                                        +---------+---------------+---------+-----------+----------+--------------+ PERO     Full                                                        +---------+---------------+---------+-----------+----------+--------------+     Summary: RIGHT: - Findings consistent with age indeterminate deep vein  thrombosis involving the right femoral vein, right popliteal vein, right posterior tibial veins, and right peroneal veins. - Findings consistent with age indeterminate superficial vein thrombosis involving the right great saphenous vein. Findings consistent with age indeterminate intramuscular thrombus involving the right gastrocnemius veins. - No cystic structure found in the popliteal fossa.  LEFT: - There is no evidence of deep vein thrombosis in the lower extremity.  - No cystic structure found in the popliteal fossa.  *See table(s) above for measurements and observations. Electronically signed by Gerarda Fraction on 06/30/2023 at 5:01:00 PM.    Final    ECHOCARDIOGRAM COMPLETE  Result Date: 06/29/2023    ECHOCARDIOGRAM REPORT   Patient Name:   Julie [name] Date of Exam: 06/29/2023 Medical Rec #:  [id number]    Height:       65.0 in Accession #:    [id number]   Weight:       150.0 lb Date of Birth:  12/29/1940    BSA:          1.750 m Patient Age:    82 years     BP:           129/88 mmHg Patient Gender: F            HR:  with minerals tablet Take 1 tablet by mouth daily.   PRESERVISION AREDS 2 PO Take 1 tablet by mouth daily.   Repatha SureClick 140 MG/ML Soaj Generic drug: Evolocumab INJECT 140 MG INTO THE SKIN EVERY 14 (FOURTEEN) DAYS.        Follow-up Information     Alysia Penna, MD.  Schedule an appointment as soon as possible for a visit in 1 week(s).   Specialty: Internal Medicine Why: For hospital follow-up Contact information: [address] [place] [address] [phone]                Discharge Exam: BP (!) 157/87 (BP Location: Right Arm)   Pulse 73   Temp (!) 97.4 F (36.3 C) (Oral)   Resp 18   Ht 5\' 5"  (1.651 m)   Wt 68 kg   SpO2 93%   BMI 24.96 kg/m   General exam: Appears calm and comfortable Respiratory system: Clear to auscultation. Respiratory effort normal. Cardiovascular system: S1 & S2 heard, RRR. No murmurs. Gastrointestinal system: Abdomen is nondistended, soft and nontender. Normal bowel sounds heard. Central nervous system: Alert and oriented. Musculoskeletal: No edema. No calf tenderness Psychiatry: Judgement and insight appear normal. Mood & affect appropriate.   Condition at discharge: stable  The results of significant diagnostics from this hospitalization (including imaging, microbiology, ancillary and laboratory) are listed below for reference.   Imaging Studies: VAS [place] LOWER EXTREMITY VENOUS (DVT)  Result Date: 06/30/2023  Lower Venous DVT Study Patient Name:  Julie [name]  Date of Exam:   06/30/2023 Medical Rec #: [id number]     Accession #:    [id number] Date of Birth: [birth year]     Patient Gender: F Patient Age:   76 years Exam Location:  [facility] Procedure:      VAS [place] LOWER EXTREMITY VENOUS (DVT) Referring Phys: Lyn Hollingshead MELVIN --------------------------------------------------------------------------------  Indications: Pain, Swelling, and pulmonary embolism.  Risk Factors: Confirmed PE. Comparison Study: No prior Performing Technologist: Marilynne Halsted RDMS, RVT  Examination Guidelines: A complete evaluation includes B-mode imaging, spectral Doppler, color Doppler, and power Doppler as needed of all accessible portions of each vessel. Bilateral testing is considered an integral part of a complete  examination. Limited examinations for reoccurring indications may be performed as noted. The reflux portion of the exam is performed with the patient in reverse Trendelenburg.  +---------+---------------+---------+-----------+----------+-----------------+ RIGHT    CompressibilityPhasicitySpontaneityPropertiesThrombus Aging    +---------+---------------+---------+-----------+----------+-----------------+ CFV      Full           Yes      Yes                                    +---------+---------------+---------+-----------+----------+-----------------+ SFJ      Full                                                           +---------+---------------+---------+-----------+----------+-----------------+ FV Prox  Full                                                           +---------+---------------+---------+-----------+----------+-----------------+  Physician Discharge Summary   Patient: Julie [name] MRN: [id number] DOB: 1941-05-07  Admit date:     06/29/2023  Discharge date: 07/02/23  Discharge Physician: Jacquelin Hawking, MD   PCP: Alysia Penna, MD   Recommendations at discharge:  PCP follow-up Pulmonology follow-up Continue anticoagulation use for at least 3 months  Discharge Diagnoses: Principal Problem:   Acute pulmonary embolism (HCC) Active Problems:   History of TIA (transient ischemic attack)   Thyroid disease   Dyslipidemia, goal LDL below 70   CAD in native artery   Hospital Course: Julie [name] is a 82 y.o. female with a history of hyperlipidemia, TIA, memory loss, CAD, chronic pain, statin myopathy, hypothyroidism, polycythemia, celiac disease, macular degeneration.  Patient presented secondary to shortness of breath with exertion was found to have evidence of acute bilateral pulmonary emboli with associated right ventricular heart strain.  Patient started on heparin IV.  PCCM was consulted but recommended against ICU admission for targeted thrombolytics as patient is hemodynamically stable.  Assessment and Plan: Acute pulmonary embolism with cor pulmonale Possibly provoked episode with recent long car ride 8 weeks prior.  CTA chest confirms evidence of acute bilateral pulmonary emboli with saddle embolus with CT evidence of right heart strain with severely reduced RV function seen on Transthoracic Echocardiogram. PCCM was consulted and recommended no ICU admission for directed thrombolytics as patient is hemodynamically stable. Patient managed on Heparin IV for at least 60 hours before transition to Eliquis. Plan to continue Eliquis for at least 3 months. Patient to follow-up with cardiology for follow-up of RV failure.   Right DVT DVT involving right femoral vein, right popliteal vein, right posterior tibial veins and right peroneal veins. Eliquis as mentioned above.   Hyponatremia Mild. Resolved.   Demand  ischemia Secondary to heart strain from acute PE.    Thrombocytopenia Mild. Stable.   Hypocalcemia Secondary to albumin. Corrected calcium of 9.2 mg/dL   Hypothyroidism Continue Synthroid 125 mcg   Hyperlipidemia History of TIA CAD Aortic atherosclerosis Patient is on Repatha as an outpatient.   Macular degeneration Patient receives ocular injections with hemorrhage after recent treatment.   Celiac disease Continue gluten free diet   Consultants: PCCM Procedures performed: Transthoracic Echocardiogram  Disposition: Home Diet recommendation: Regular diet   DISCHARGE MEDICATION: Allergies as of 07/02/2023       Reactions   Losartan Anaphylaxis   Diffuse hives and tongue swelling   Propantheline Rash   Cannot remember other symptoms   Atorvastatin    Myalgia    Gluten Meal Diarrhea   bloating   Rosuvastatin    Sulfa Antibiotics Swelling   Zetia [ezetimibe]    Myalgias    Demerol [meperidine] Rash   Morphine And Codeine Rash        Medication List     TAKE these medications    apixaban 5 MG Tabs tablet Commonly known as: ELIQUIS Take 2 tablets (10 mg total) by mouth 2 (two) times daily for 7 days, THEN 1 tablet (5 mg total) 2 (two) times daily. Start taking on: July 02, 2023   cholecalciferol 1000 units tablet Commonly known as: VITAMIN D Take 1,000 Units by mouth daily.   diphenhydrAMINE 25 MG tablet Commonly known as: BENADRYL Take 1 tablet (25 mg total) by mouth every 8 (eight) hours as needed for up to 21 doses for allergies or sleep.   levothyroxine 125 MCG tablet Commonly known as: SYNTHROID Take 125 mcg by mouth daily before breakfast.   multivitamin  with minerals tablet Take 1 tablet by mouth daily.   PRESERVISION AREDS 2 PO Take 1 tablet by mouth daily.   Repatha SureClick 140 MG/ML Soaj Generic drug: Evolocumab INJECT 140 MG INTO THE SKIN EVERY 14 (FOURTEEN) DAYS.        Follow-up Information     Alysia Penna, MD.  Schedule an appointment as soon as possible for a visit in 1 week(s).   Specialty: Internal Medicine Why: For hospital follow-up Contact information: [address] [place] [address] [phone]                Discharge Exam: BP (!) 157/87 (BP Location: Right Arm)   Pulse 73   Temp (!) 97.4 F (36.3 C) (Oral)   Resp 18   Ht 5\' 5"  (1.651 m)   Wt 68 kg   SpO2 93%   BMI 24.96 kg/m   General exam: Appears calm and comfortable Respiratory system: Clear to auscultation. Respiratory effort normal. Cardiovascular system: S1 & S2 heard, RRR. No murmurs. Gastrointestinal system: Abdomen is nondistended, soft and nontender. Normal bowel sounds heard. Central nervous system: Alert and oriented. Musculoskeletal: No edema. No calf tenderness Psychiatry: Judgement and insight appear normal. Mood & affect appropriate.   Condition at discharge: stable  The results of significant diagnostics from this hospitalization (including imaging, microbiology, ancillary and laboratory) are listed below for reference.   Imaging Studies: VAS [place] LOWER EXTREMITY VENOUS (DVT)  Result Date: 06/30/2023  Lower Venous DVT Study Patient Name:  Julie [name]  Date of Exam:   06/30/2023 Medical Rec #: [id number]     Accession #:    [id number] Date of Birth: [birth year]     Patient Gender: F Patient Age:   76 years Exam Location:  [facility] Procedure:      VAS [place] LOWER EXTREMITY VENOUS (DVT) Referring Phys: Lyn Hollingshead MELVIN --------------------------------------------------------------------------------  Indications: Pain, Swelling, and pulmonary embolism.  Risk Factors: Confirmed PE. Comparison Study: No prior Performing Technologist: Marilynne Halsted RDMS, RVT  Examination Guidelines: A complete evaluation includes B-mode imaging, spectral Doppler, color Doppler, and power Doppler as needed of all accessible portions of each vessel. Bilateral testing is considered an integral part of a complete  examination. Limited examinations for reoccurring indications may be performed as noted. The reflux portion of the exam is performed with the patient in reverse Trendelenburg.  +---------+---------------+---------+-----------+----------+-----------------+ RIGHT    CompressibilityPhasicitySpontaneityPropertiesThrombus Aging    +---------+---------------+---------+-----------+----------+-----------------+ CFV      Full           Yes      Yes                                    +---------+---------------+---------+-----------+----------+-----------------+ SFJ      Full                                                           +---------+---------------+---------+-----------+----------+-----------------+ FV Prox  Full                                                           +---------+---------------+---------+-----------+----------+-----------------+  Physician Discharge Summary   Patient: Julie [name] MRN: [id number] DOB: 1941-05-07  Admit date:     06/29/2023  Discharge date: 07/02/23  Discharge Physician: Jacquelin Hawking, MD   PCP: Alysia Penna, MD   Recommendations at discharge:  PCP follow-up Pulmonology follow-up Continue anticoagulation use for at least 3 months  Discharge Diagnoses: Principal Problem:   Acute pulmonary embolism (HCC) Active Problems:   History of TIA (transient ischemic attack)   Thyroid disease   Dyslipidemia, goal LDL below 70   CAD in native artery   Hospital Course: Julie [name] is a 82 y.o. female with a history of hyperlipidemia, TIA, memory loss, CAD, chronic pain, statin myopathy, hypothyroidism, polycythemia, celiac disease, macular degeneration.  Patient presented secondary to shortness of breath with exertion was found to have evidence of acute bilateral pulmonary emboli with associated right ventricular heart strain.  Patient started on heparin IV.  PCCM was consulted but recommended against ICU admission for targeted thrombolytics as patient is hemodynamically stable.  Assessment and Plan: Acute pulmonary embolism with cor pulmonale Possibly provoked episode with recent long car ride 8 weeks prior.  CTA chest confirms evidence of acute bilateral pulmonary emboli with saddle embolus with CT evidence of right heart strain with severely reduced RV function seen on Transthoracic Echocardiogram. PCCM was consulted and recommended no ICU admission for directed thrombolytics as patient is hemodynamically stable. Patient managed on Heparin IV for at least 60 hours before transition to Eliquis. Plan to continue Eliquis for at least 3 months. Patient to follow-up with cardiology for follow-up of RV failure.   Right DVT DVT involving right femoral vein, right popliteal vein, right posterior tibial veins and right peroneal veins. Eliquis as mentioned above.   Hyponatremia Mild. Resolved.   Demand  ischemia Secondary to heart strain from acute PE.    Thrombocytopenia Mild. Stable.   Hypocalcemia Secondary to albumin. Corrected calcium of 9.2 mg/dL   Hypothyroidism Continue Synthroid 125 mcg   Hyperlipidemia History of TIA CAD Aortic atherosclerosis Patient is on Repatha as an outpatient.   Macular degeneration Patient receives ocular injections with hemorrhage after recent treatment.   Celiac disease Continue gluten free diet   Consultants: PCCM Procedures performed: Transthoracic Echocardiogram  Disposition: Home Diet recommendation: Regular diet   DISCHARGE MEDICATION: Allergies as of 07/02/2023       Reactions   Losartan Anaphylaxis   Diffuse hives and tongue swelling   Propantheline Rash   Cannot remember other symptoms   Atorvastatin    Myalgia    Gluten Meal Diarrhea   bloating   Rosuvastatin    Sulfa Antibiotics Swelling   Zetia [ezetimibe]    Myalgias    Demerol [meperidine] Rash   Morphine And Codeine Rash        Medication List     TAKE these medications    apixaban 5 MG Tabs tablet Commonly known as: ELIQUIS Take 2 tablets (10 mg total) by mouth 2 (two) times daily for 7 days, THEN 1 tablet (5 mg total) 2 (two) times daily. Start taking on: July 02, 2023   cholecalciferol 1000 units tablet Commonly known as: VITAMIN D Take 1,000 Units by mouth daily.   diphenhydrAMINE 25 MG tablet Commonly known as: BENADRYL Take 1 tablet (25 mg total) by mouth every 8 (eight) hours as needed for up to 21 doses for allergies or sleep.   levothyroxine 125 MCG tablet Commonly known as: SYNTHROID Take 125 mcg by mouth daily before breakfast.   multivitamin  with minerals tablet Take 1 tablet by mouth daily.   PRESERVISION AREDS 2 PO Take 1 tablet by mouth daily.   Repatha SureClick 140 MG/ML Soaj Generic drug: Evolocumab INJECT 140 MG INTO THE SKIN EVERY 14 (FOURTEEN) DAYS.        Follow-up Information     Alysia Penna, MD.  Schedule an appointment as soon as possible for a visit in 1 week(s).   Specialty: Internal Medicine Why: For hospital follow-up Contact information: [address] [place] [address] [phone]                Discharge Exam: BP (!) 157/87 (BP Location: Right Arm)   Pulse 73   Temp (!) 97.4 F (36.3 C) (Oral)   Resp 18   Ht 5\' 5"  (1.651 m)   Wt 68 kg   SpO2 93%   BMI 24.96 kg/m   General exam: Appears calm and comfortable Respiratory system: Clear to auscultation. Respiratory effort normal. Cardiovascular system: S1 & S2 heard, RRR. No murmurs. Gastrointestinal system: Abdomen is nondistended, soft and nontender. Normal bowel sounds heard. Central nervous system: Alert and oriented. Musculoskeletal: No edema. No calf tenderness Psychiatry: Judgement and insight appear normal. Mood & affect appropriate.   Condition at discharge: stable  The results of significant diagnostics from this hospitalization (including imaging, microbiology, ancillary and laboratory) are listed below for reference.   Imaging Studies: VAS [place] LOWER EXTREMITY VENOUS (DVT)  Result Date: 06/30/2023  Lower Venous DVT Study Patient Name:  Julie [name]  Date of Exam:   06/30/2023 Medical Rec #: [id number]     Accession #:    [id number] Date of Birth: [birth year]     Patient Gender: F Patient Age:   76 years Exam Location:  [facility] Procedure:      VAS [place] LOWER EXTREMITY VENOUS (DVT) Referring Phys: Lyn Hollingshead MELVIN --------------------------------------------------------------------------------  Indications: Pain, Swelling, and pulmonary embolism.  Risk Factors: Confirmed PE. Comparison Study: No prior Performing Technologist: Marilynne Halsted RDMS, RVT  Examination Guidelines: A complete evaluation includes B-mode imaging, spectral Doppler, color Doppler, and power Doppler as needed of all accessible portions of each vessel. Bilateral testing is considered an integral part of a complete  examination. Limited examinations for reoccurring indications may be performed as noted. The reflux portion of the exam is performed with the patient in reverse Trendelenburg.  +---------+---------------+---------+-----------+----------+-----------------+ RIGHT    CompressibilityPhasicitySpontaneityPropertiesThrombus Aging    +---------+---------------+---------+-----------+----------+-----------------+ CFV      Full           Yes      Yes                                    +---------+---------------+---------+-----------+----------+-----------------+ SFJ      Full                                                           +---------+---------------+---------+-----------+----------+-----------------+ FV Prox  Full                                                           +---------+---------------+---------+-----------+----------+-----------------+  FV Mid   Full                                                        +---------+---------------+---------+-----------+----------+--------------+ FV DistalFull                                                        +---------+---------------+---------+-----------+----------+--------------+ PFV      Full                                                        +---------+---------------+---------+-----------+----------+--------------+ POP      Full           Yes      Yes                                 +---------+---------------+---------+-----------+----------+--------------+ PTV      Full                                                        +---------+---------------+---------+-----------+----------+--------------+ PERO     Full                                                        +---------+---------------+---------+-----------+----------+--------------+     Summary: RIGHT: - Findings consistent with age indeterminate deep vein  thrombosis involving the right femoral vein, right popliteal vein, right posterior tibial veins, and right peroneal veins. - Findings consistent with age indeterminate superficial vein thrombosis involving the right great saphenous vein. Findings consistent with age indeterminate intramuscular thrombus involving the right gastrocnemius veins. - No cystic structure found in the popliteal fossa.  LEFT: - There is no evidence of deep vein thrombosis in the lower extremity.  - No cystic structure found in the popliteal fossa.  *See table(s) above for measurements and observations. Electronically signed by Gerarda Fraction on 06/30/2023 at 5:01:00 PM.    Final    ECHOCARDIOGRAM COMPLETE  Result Date: 06/29/2023    ECHOCARDIOGRAM REPORT   Patient Name:   Julie [name] Date of Exam: 06/29/2023 Medical Rec #:  [id number]    Height:       65.0 in Accession #:    [id number]   Weight:       150.0 lb Date of Birth:  12/29/1940    BSA:          1.750 m Patient Age:    82 years     BP:           129/88 mmHg Patient Gender: F            HR:  with minerals tablet Take 1 tablet by mouth daily.   PRESERVISION AREDS 2 PO Take 1 tablet by mouth daily.   Repatha SureClick 140 MG/ML Soaj Generic drug: Evolocumab INJECT 140 MG INTO THE SKIN EVERY 14 (FOURTEEN) DAYS.        Follow-up Information     Alysia Penna, MD.  Schedule an appointment as soon as possible for a visit in 1 week(s).   Specialty: Internal Medicine Why: For hospital follow-up Contact information: [address] [place] [address] [phone]                Discharge Exam: BP (!) 157/87 (BP Location: Right Arm)   Pulse 73   Temp (!) 97.4 F (36.3 C) (Oral)   Resp 18   Ht 5\' 5"  (1.651 m)   Wt 68 kg   SpO2 93%   BMI 24.96 kg/m   General exam: Appears calm and comfortable Respiratory system: Clear to auscultation. Respiratory effort normal. Cardiovascular system: S1 & S2 heard, RRR. No murmurs. Gastrointestinal system: Abdomen is nondistended, soft and nontender. Normal bowel sounds heard. Central nervous system: Alert and oriented. Musculoskeletal: No edema. No calf tenderness Psychiatry: Judgement and insight appear normal. Mood & affect appropriate.   Condition at discharge: stable  The results of significant diagnostics from this hospitalization (including imaging, microbiology, ancillary and laboratory) are listed below for reference.   Imaging Studies: VAS [place] LOWER EXTREMITY VENOUS (DVT)  Result Date: 06/30/2023  Lower Venous DVT Study Patient Name:  Julie [name]  Date of Exam:   06/30/2023 Medical Rec #: [id number]     Accession #:    [id number] Date of Birth: [birth year]     Patient Gender: F Patient Age:   76 years Exam Location:  [facility] Procedure:      VAS [place] LOWER EXTREMITY VENOUS (DVT) Referring Phys: Lyn Hollingshead MELVIN --------------------------------------------------------------------------------  Indications: Pain, Swelling, and pulmonary embolism.  Risk Factors: Confirmed PE. Comparison Study: No prior Performing Technologist: Marilynne Halsted RDMS, RVT  Examination Guidelines: A complete evaluation includes B-mode imaging, spectral Doppler, color Doppler, and power Doppler as needed of all accessible portions of each vessel. Bilateral testing is considered an integral part of a complete  examination. Limited examinations for reoccurring indications may be performed as noted. The reflux portion of the exam is performed with the patient in reverse Trendelenburg.  +---------+---------------+---------+-----------+----------+-----------------+ RIGHT    CompressibilityPhasicitySpontaneityPropertiesThrombus Aging    +---------+---------------+---------+-----------+----------+-----------------+ CFV      Full           Yes      Yes                                    +---------+---------------+---------+-----------+----------+-----------------+ SFJ      Full                                                           +---------+---------------+---------+-----------+----------+-----------------+ FV Prox  Full                                                           +---------+---------------+---------+-----------+----------+-----------------+

## 2023-07-02 NOTE — Progress Notes (Addendum)
PHARMACY - ANTICOAGULATION CONSULT NOTE  Pharmacy Consult for heparin to Eliquis Indication: pulmonary embolus  Allergies  Allergen Reactions   Losartan Anaphylaxis    Diffuse hives and tongue swelling   Propantheline Rash    Cannot remember other symptoms    Atorvastatin     Myalgia    Gluten Meal Diarrhea    bloating   Rosuvastatin    Sulfa Antibiotics Swelling   Zetia [Ezetimibe]     Myalgias    Demerol [Meperidine] Rash   Morphine And Codeine Rash    Patient Measurements: Height: 5\' 5"  (165.1 cm) Weight: 68 kg (150 lb) IBW/kg (Calculated) : 57 Heparin Dosing Weight: 68kg  Vital Signs: Temp: 97.4 F (36.3 C) (10/27 0800) Temp Source: Oral (10/27 0800) BP: 127/86 (10/27 0800) Pulse Rate: 78 (10/27 0345)  Labs: Recent Labs    06/29/23 1110 06/29/23 1147 06/29/23 1405 06/29/23 2349 06/30/23 0438 06/30/23 0640 06/30/23 1009 07/01/23 0303 07/02/23 0323  HGB 15.7*  --   --   --  14.1  --   --  13.9 13.4  HCT 45.5  --   --   --  41.6  --   --  40.2 38.9  PLT 127*  --   --   --  129*  --   --  137* 149*  HEPARINUNFRC  --   --   --    < >  --   --  0.69 0.65 0.52  CREATININE 0.85  --   --   --   --  0.81  --   --   --   TROPONINIHS  --  241* 295*  --   --   --   --   --   --    < > = values in this interval not displayed.    Estimated Creatinine Clearance: 49 mL/min (by C-G formula based on SCr of 0.81 mg/dL).   Medical History: Past Medical History:  Diagnosis Date   Arthritis    Asthma    CAD in native artery 06/06/2017   Mild LAD disease on coronary CT-A 05/2017.   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   Thyroid disease    TIA (transient ischemic attack)    2017    Transaminitis 09/15/2020   Assessment: 82 yof presented to the ED with SOB. Found to have a bilateral saddle PE with right heart strain. No anticoagulation prior to admission. Baseline Hgb is WNL but platelets are low. Patient initiated on heparin gtt per pharmacy, now being transitioned to Eliquis  CBC stable. Patient has been therapeutic on IV heparin for at least 48 hours, will reduce Eliquis loading dose duration from 7 to 5 days.  Goal of Therapy:  Monitor platelets by anticoagulation protocol: Yes   Plan:  Discontinue heparin gtt Start Eliquis 10mg  BID PO x 5 days, then 5mg  PO BID Monitor daily CBC Pharmacy will continue to monitor peripherally  Nicole Kindred, PharmD PGY1 Pharmacy Resident 07/02/2023 8:26 AM  ADDENDUM: MD would like for patient to take 10mg  BID dose x 7 days, rather than the shortened duration mentioned above. Discharge med rec reflects this duration and patient  is aware.

## 2023-07-02 NOTE — Progress Notes (Signed)
SATURATION QUALIFICATIONS: (This note is used to comply with regulatory documentation for home oxygen)  Patient Saturations on Room Air at Rest = 92%  Patient Saturations on Room Air while Ambulating = 94%   Please briefly explain why patient needs home oxygen:  No indication

## 2023-07-03 ENCOUNTER — Other Ambulatory Visit: Payer: Self-pay | Admitting: Family Medicine

## 2023-07-03 ENCOUNTER — Other Ambulatory Visit (HOSPITAL_BASED_OUTPATIENT_CLINIC_OR_DEPARTMENT_OTHER): Payer: Self-pay

## 2023-07-03 MED ORDER — APIXABAN (ELIQUIS) VTE STARTER PACK (10MG AND 5MG): ORAL_TABLET | ORAL | 0 refills | Status: AC

## 2023-07-03 NOTE — Progress Notes (Signed)
Insurance would not pay for Eliquis BID. Starter pack prescribed and confirmed pharmacy will cover prescription.

## 2023-07-10 ENCOUNTER — Encounter (HOSPITAL_BASED_OUTPATIENT_CLINIC_OR_DEPARTMENT_OTHER): Payer: Self-pay

## 2023-07-14 ENCOUNTER — Ambulatory Visit (INDEPENDENT_AMBULATORY_CARE_PROVIDER_SITE_OTHER): Payer: Medicare Other | Admitting: Primary Care

## 2023-07-14 ENCOUNTER — Encounter: Payer: Self-pay | Admitting: Primary Care

## 2023-07-14 VITALS — BP 126/74 | HR 63 | Temp 97.8°F | Ht 65.0 in | Wt 146.8 lb

## 2023-07-14 DIAGNOSIS — I2510 Atherosclerotic heart disease of native coronary artery without angina pectoris: Secondary | ICD-10-CM

## 2023-07-14 DIAGNOSIS — I2602 Saddle embolus of pulmonary artery with acute cor pulmonale: Secondary | ICD-10-CM | POA: Diagnosis not present

## 2023-07-14 NOTE — Patient Instructions (Addendum)
Plan anticoagulation for minimum 3 months - more likely 6 months due to heart strain Follow up with hematology they will determine time frame of anticoagulation and review your lab work  We will get an echocardiogram in January   Orders: Ambulator walk on room air   Follow-up: Dr. Judeth Horn in January after echocardiogram    Pulmonary Embolism  A pulmonary embolism (PE) is a sudden blockage or decrease of blood flow in one or both lungs that happens when a clot travels into the arteries of the lung (pulmonary arteries). Most blockages come from a blood clot that forms in the vein of a leg or arm (deep vein thrombosis, DVT) and travels to the lungs. A clot is blood that has thickened into a gel or solid. PE is a dangerous and life-threatening condition that needs to be treated right away. What are the causes? This condition is usually caused by a blood clot that forms in a vein and moves to the lungs. In rare cases, it may be caused by air, fat, part of a tumor, or other tissue that moves through the veins and into the lungs. What increases the risk? The following factors may make you more likely to develop this condition: Experiencing a traumatic injury, such as breaking a hip or leg. Having: A spinal cord injury. Major surgery, especially hip or knee replacement, or surgery on parts of the nervous system or on the abdomen. A stroke. A blood-clotting disease. Long-term (chronic) lung or heart disease. Cancer, especially if you are being treated with chemotherapy. A central venous catheter. Taking medicines that contain estrogen. These include birth control pills and hormone replacement therapy. Being: Pregnant. In the period of time after your baby is delivered (postpartum). Older than age 26. Overweight. A smoker, especially if you have other risks. Not very active (sedentary), not being able to move at all, or spending long periods sitting, such as travel over 6 hours. You are  also at a greater risk if you have a leg in a cast or splint. What are the signs or symptoms? Symptoms of this condition usually start suddenly and include: Shortness of breath during activity or at rest. Coughing, coughing up blood, or coughing up bloody mucus. Chest pain, back pain, or shoulder blade pain that gets worse with deep breaths. Rapid or irregular heartbeat. Feeling light-headed or dizzy, or fainting. Feeling anxious. Pain and swelling in a leg. This is a symptom of DVT, which can lead to PE. How is this diagnosed? This condition may be diagnosed based on your medical history, a physical exam, and tests. Tests may include: Blood tests. An ECG (electrocardiogram) of the heart. A CT pulmonary angiogram. This test checks blood flow in and around your lungs. A ventilation-perfusion scan, also called a lung VQ scan. This test measures air flow and blood flow to the lungs. An ultrasound to check for a DVT. How is this treated? Treatment for this condition depends on many factors, such as the cause of your PE, your risk for bleeding or developing more clots, and other medical conditions you may have. Treatment aims to stop blood clots from forming or growing larger. In some cases, treatment may be aimed at breaking apart or removing the blood clot. Treatment may include: Medicines, such as: Blood thinning medicines, also called anticoagulants, to stop clots from forming and growing. Medicines that break apart clots (fibrinolytics). Procedures, such as: Using a flexible tube to remove a blood clot (embolectomy) or to deliver medicine to destroy  it (catheter-directed thrombolysis). Surgery to remove the clot (surgical embolectomy). This is rare. You may need a combination of immediate, long-term, and extended treatments. Your treatment may continue for several months (maintenance therapy) or longer depending on your medical conditions. You and your health care provider will work together  to choose the treatment program that is best for you. Follow these instructions at home: Medicines Take over-the-counter and prescription medicines only as told by your health care provider. If you are taking blood thinners: Talk with your health care provider before you take any medicines that contain aspirin or NSAIDs, such as ibuprofen. These medicines increase your risk for dangerous bleeding. Take your medicine exactly as told, at the same time every day. Avoid activities that could cause injury or bruising, and follow instructions about how to prevent falls. Wear a medical alert bracelet or carry a card that lists what medicines you take. Understand what foods and drugs interact with any medicines that you are taking. General instructions Ask your health care provider when you may return to your normal activities. Avoid sitting or lying for a long time without moving. Maintain a healthy weight. Ask your health care provider what weight is healthy for you. Do not use any products that contain nicotine or tobacco. These products include cigarettes, chewing tobacco, and vaping devices, such as e-cigarettes. If you need help quitting, ask your health care provider. Talk with your health care provider about any travel plans. It is important to make sure that you are still able to take your medicine while traveling. Keep all follow-up visits. This is important. Where to find more information American Lung Association: www.lung.org Centers for Disease Control and Prevention: [employer] Contact a health care provider if: You missed a dose of your blood thinner medicine. You have a fever. Get help right away if: You have: New or increased pain, swelling, warmth, or redness in an arm or leg. Shortness of breath that gets worse during activity or at rest. Worsening chest pain. A rapid or irregular heartbeat. A severe headache. Vision changes. A serious fall or accident, or you hit your  head. Blood in your vomit, stool, or urine. A cut that will not stop bleeding. You cough up blood. You feel light-headed or dizzy, and that feeling does not go away. You cannot move your arms or legs. You are confused or have memory loss. These symptoms may represent a serious problem that is an emergency. Do not wait to see if the symptoms will go away. Get medical help right away. Call your local emergency services (911 in the [place] Do not drive yourself to the hospital. Summary A pulmonary embolism (PE) is a serious and potentially life-threatening condition. It happens when a blood clot from one part of the body travels to the arteries of the lung, causing a sudden blockage or decrease of blood flow to the lungs. This may result in shortness of breath, chest pain, dizziness, and fainting. Treatments for this condition usually include medicines to thin your blood (anticoagulants) or medicines to break apart blood clots. If you are given blood thinners, take your medicine exactly as told by your health care provider, at the same time every day. This is important. Understand what foods and drugs interact with any medicines that you are taking. If you have signs of PE or DVT, call your local emergency services (911 in the [place] This information is not intended to replace advice given to you by your health care provider. Make sure you  discuss any questions you have with your health care provider. Document Revised: 07/24/2020 Document Reviewed: 07/24/2020 Elsevier Patient Education  2024 [employer].

## 2023-07-14 NOTE — Progress Notes (Signed)
@Patient  ID: Julie [name], female    DOB: 21-Dec-1940, 82 y.o.   MRN: [id number]  Chief Complaint  Patient presents with   Follow-up    Referring provider: Alysia Penna, MD  HPI: 82 year old female, never smoked. PMH significant for CAD, acute pulmonary embolism, celiac disease, osteopenia, polycythemia, dyslipidemia, hx TIA, memory loss.   07/14/2023 Hospitalized on 10/24- 10/27 for acute bilateral PE with saddle embolus and CT evidence of right heart strain with severely reduced RV function. She did not require ICU admission for direct thrombolytics. DVT involving right femoral vein, right popliteal popliteal vein, right posterior tibial vein and right peroneal veins. Plan continue anticoagulation for 3 months. Needs echocardiogram in January to follow up on RV dysfunction following submassive PE  She is doing well today, accompanied by her partner. She has no respiratory complaints or oxygen requirements. She is compliant with Eliquis and tolerating blood thinner. Denies shortness of breath symptoms. She was able to start going to aquatic center again, she tolerated about 15 mins of exercise.   She reports an 8 hour long car ride I may, she stopped every hour on the way down. She gets an injection in her right eye called allea and read that there is a small chance for blood clot with medication use. Next injection with ophthalmology isn't until February. Primary care did blood work and referred to hematology. She has always bruised easily.   Allergies  Allergen Reactions   Losartan Anaphylaxis    Diffuse hives and tongue swelling   Propantheline Rash    Cannot remember other symptoms    Atorvastatin     Myalgia    Gluten Meal Diarrhea    bloating   Rosuvastatin    Sulfa Antibiotics Swelling   Zetia [Ezetimibe]     Myalgias    Demerol [Meperidine] Rash   Morphine And Codeine Rash    Immunization History  Administered Date(s) Administered   Fluad Trivalent(High Dose 65+)  06/13/2023   Tdap 02/01/2016    Past Medical History:  Diagnosis Date   Arthritis    Asthma    CAD in native artery 06/06/2017   Mild LAD disease on coronary CT-A 05/2017.   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 On day 8, switch to one-5mg  tablet twice daily. 74 each 0   cholecalciferol (VITAMIN D) 1000 units tablet Take 1,000 Units by mouth daily.     diphenhydrAMINE (BENADRYL) 25 MG tablet Take 1 tablet (25 mg total) by mouth every 8 (eight) hours as needed for up to 21 doses for allergies or sleep. 21 tablet 0   levothyroxine (SYNTHROID) 125 MCG tablet Take 125 mcg by mouth daily before breakfast.  Multiple Vitamins-Minerals (MULTIVITAMIN WITH MINERALS) tablet Take 1 tablet by mouth daily.     Multiple Vitamins-Minerals (PRESERVISION AREDS 2 PO) Take 1 tablet by mouth daily.     Evolocumab (REPATHA SURECLICK) 140 MG/ML SOAJ INJECT 140 MG INTO THE SKIN EVERY 14 (FOURTEEN) DAYS. (Patient not taking: Reported on 07/14/2023) 2 mL 11   No facility-administered medications prior to visit.   Review of Systems  Review of Systems  Constitutional: Negative.   Respiratory: Negative.    Cardiovascular: Negative.     Physical Exam  BP 126/74 (BP Location: Left Arm, Patient Position:  Sitting, Cuff Size: Normal)   Pulse 63   Temp 97.8 F (36.6 C) (Oral)   Ht 5\' 5"  (1.651 m)   Wt 146 lb 12.8 oz (66.6 kg)   SpO2 98%   BMI 24.43 kg/m  Physical Exam Constitutional:      Appearance: Normal appearance.  HENT:     Head: Normocephalic and atraumatic.  Cardiovascular:     Rate and Rhythm: Normal rate and regular rhythm.  Pulmonary:     Effort: Pulmonary effort is normal.     Breath sounds: Normal breath sounds.  Musculoskeletal:     Right lower leg: Edema present.     Left lower leg: Edema present.  Skin:    Findings: Bruising present.  Neurological:     Mental Status: She is alert.      Lab Results:  CBC    Component Value Date/Time   WBC 5.6 07/02/2023 0323   RBC 4.03 07/02/2023 0323   HGB 13.4 07/02/2023 0323   HCT 38.9 07/02/2023 0323   PLT 149 (L) 07/02/2023 0323   MCV 96.5 07/02/2023 0323   MCH 33.3 07/02/2023 0323   MCHC 34.4 07/02/2023 0323   RDW 12.5 07/02/2023 0323   LYMPHSABS 1.6 06/29/2023 1110   MONOABS 0.5 06/29/2023 1110   EOSABS 0.1 06/29/2023 1110   BASOSABS 0.0 06/29/2023 1110    BMET    Component Value Date/Time   NA 137 06/30/2023 0640   NA 142 12/29/2021 0802   K 4.2 06/30/2023 0640   CL 102 06/30/2023 0640   CO2 25 06/30/2023 0640   GLUCOSE 113 (H) 06/30/2023 0640   BUN 9 06/30/2023 0640   BUN 12 12/29/2021 0802   CREATININE 0.81 06/30/2023 0640   CALCIUM 8.2 (L) 06/30/2023 0640   GFRNONAA >60 06/30/2023 0640   GFRAA >60 08/05/2019 0953    BNP    Component Value Date/Time   BNP 81.9 06/29/2023 1148    ProBNP No results found for: "PROBNP"  Imaging: VAS [place] LOWER EXTREMITY VENOUS (DVT)  Result Date: 06/30/2023  Lower Venous DVT Study Patient Name:  Julie [name]  Date of Exam:   06/30/2023 Medical Rec #: [id number]     Accession #:    [id number] Date of Birth: [birth year]     Patient Gender: F Patient Age:   62 years Exam Location:  [facility] Procedure:      VAS [place] LOWER EXTREMITY VENOUS (DVT)  Referring Phys: Lyn Hollingshead MELVIN --------------------------------------------------------------------------------  Indications: Pain, Swelling, and pulmonary embolism.  Risk Factors: Confirmed PE. Comparison Study: No prior Performing Technologist: Marilynne Halsted RDMS, RVT  Examination Guidelines: A complete evaluation includes B-mode imaging, spectral Doppler, color Doppler, and power Doppler as needed of all accessible portions of each vessel. Bilateral testing is considered an integral part of a complete examination. Limited examinations for reoccurring indications may be performed as noted. The reflux portion of the exam  is performed with the patient in reverse Trendelenburg.  +---------+---------------+---------+-----------+----------+-----------------+ RIGHT    CompressibilityPhasicitySpontaneityPropertiesThrombus Aging    +---------+---------------+---------+-----------+----------+-----------------+ CFV      Full           Yes      Yes                                    +---------+---------------+---------+-----------+----------+-----------------+ SFJ      Full                                                           +---------+---------------+---------+-----------+----------+-----------------+ FV Prox  Full                                                           +---------+---------------+---------+-----------+----------+-----------------+ FV Mid   Full                                                           +---------+---------------+---------+-----------+----------+-----------------+ FV DistalNone           No       No                   Age Indeterminate +---------+---------------+---------+-----------+----------+-----------------+ PFV      Full                                                           +---------+---------------+---------+-----------+----------+-----------------+ POP      None           No       No                                      +---------+---------------+---------+-----------+----------+-----------------+ PTV      Partial                                      Age Indeterminate +---------+---------------+---------+-----------+----------+-----------------+ PERO     Partial                                      Age Indeterminate +---------+---------------+---------+-----------+----------+-----------------+ Gastroc  None                                         Age Indeterminate +---------+---------------+---------+-----------+----------+-----------------+ GSV      None  Age Indeterminate +---------+---------------+---------+-----------+----------+-----------------+ GSV thrombus seen in proximal calf.  +---------+---------------+---------+-----------+----------+--------------+ LEFT     CompressibilityPhasicitySpontaneityPropertiesThrombus Aging +---------+---------------+---------+-----------+----------+--------------+ CFV      Full           Yes      Yes                                 +---------+---------------+---------+-----------+----------+--------------+ SFJ      Full                                                        +---------+---------------+---------+-----------+----------+--------------+ FV Prox  Full                                                        +---------+---------------+---------+-----------+----------+--------------+ FV Mid   Full                                                        +---------+---------------+---------+-----------+----------+--------------+ FV DistalFull                                                        +---------+---------------+---------+-----------+----------+--------------+ PFV      Full                                                        +---------+---------------+---------+-----------+----------+--------------+ POP      Full           Yes      Yes                                  +---------+---------------+---------+-----------+----------+--------------+ PTV      Full                                                        +---------+---------------+---------+-----------+----------+--------------+ PERO     Full                                                        +---------+---------------+---------+-----------+----------+--------------+     Summary: RIGHT: - Findings consistent with age indeterminate deep vein thrombosis involving the right femoral vein, right popliteal vein, right posterior tibial veins, and right peroneal veins. - Findings consistent with age  indeterminate superficial vein thrombosis involving the right great saphenous vein. Findings consistent with age indeterminate intramuscular thrombus involving the right gastrocnemius veins. - No cystic structure found in the popliteal fossa.  LEFT: - There is no evidence of deep vein thrombosis in the lower extremity.  - No cystic structure found in the popliteal fossa.  *See table(s) above for measurements and observations. Electronically signed by Gerarda Fraction on 06/30/2023 at 5:01:00 PM.    Final    ECHOCARDIOGRAM COMPLETE  Result Date: 06/29/2023    ECHOCARDIOGRAM REPORT   Patient Name:   Julie [name] Date of Exam: 06/29/2023 Medical Rec #:  [id number]    Height:       65.0 in Accession #:    [id number]   Weight:       150.0 lb Date of Birth:  1941/02/20    BSA:          1.750 m Patient Age:    82 years     BP:           129/88 mmHg Patient Gender: F            HR:           79 bpm. Exam Location:  Inpatient Procedure: 2D Echo, Cardiac Doppler and Color Doppler STAT ECHO Indications:    pulmonary embolus  History:        Patient has prior history of Echocardiogram examinations, most                 recent 10/06/2015. CAD; Risk Factors:Dyslipidemia.  Sonographer:    Delcie Roch RDCS Referring Phys: 19 SCOTT ZACKOWSKI IMPRESSIONS  1. Based on LVOT VTI cardiac output is severely reduced  (2.65L/min) due to right sided failure. Left ventricular ejection fraction, by estimation, is 45 to 50%. The left ventricle has mildly decreased function. The left ventricle demonstrates global hypokinesis. Left ventricular diastolic parameters are consistent with Grade I diastolic dysfunction (impaired relaxation). There is the interventricular septum is flattened in systole, consistent with right ventricular pressure overload.  2. Free wall akinesis with tethering of the apex (McConnell's sign). Right ventricular systolic function is severely reduced. The right ventricular size is mildly enlarged. There is normal pulmonary artery systolic pressure.  3. The mitral valve is normal in structure. Trivial mitral valve regurgitation.  4. The aortic valve is normal in structure. Aortic valve regurgitation is not visualized. Conclusion(s)/Recommendation(s): Findings consistent with with significant right heart strain. FINDINGS  Left Ventricle: Based on LVOT VTI cardiac output is severely reduced (2.65L/min) due to right sided failure. Left ventricular ejection fraction, by estimation, is 45 to 50%. The left ventricle has mildly decreased function. The left ventricle demonstrates global hypokinesis. The left ventricular internal cavity size was small. There is no left ventricular hypertrophy. The interventricular septum is flattened in systole, consistent with right ventricular pressure overload. Left ventricular diastolic parameters are consistent with Grade I diastolic dysfunction (impaired relaxation). Right Ventricle: Free wall akinesis with tethering of the apex (McConnell's sign). The right ventricular size is mildly enlarged. No increase in right ventricular wall thickness. Right ventricular systolic function is severely reduced. There is normal pulmonary artery systolic pressure. The tricuspid regurgitant velocity is 2.47 m/s, and with an assumed right atrial pressure of 3 mmHg, the estimated right ventricular  systolic pressure is 27.4 mmHg. Left Atrium: Left atrial size was normal in size. Right Atrium: Right atrial size was normal in size. Pericardium: There is no evidence of pericardial effusion. Mitral Valve: The mitral  valve is normal in structure. Trivial mitral valve regurgitation. Tricuspid Valve: The tricuspid valve is normal in structure. Tricuspid valve regurgitation is mild. Aortic Valve: The aortic valve is normal in structure. Aortic valve regurgitation is not visualized. Pulmonic Valve: The pulmonic valve was normal in structure. Pulmonic valve regurgitation is trivial. Aorta: The aortic root and ascending aorta are structurally normal, with no evidence of dilitation. Venous: The inferior vena cava was not well visualized. IAS/Shunts: The interatrial septum was not well visualized.  LEFT VENTRICLE PLAX 2D LVIDd:         3.90 cm     Diastology LVIDs:         2.70 cm     LV e' medial:    3.81 cm/s LV PW:         0.90 cm     LV E/e' medial:  8.2 LV IVS:        1.00 cm     LV e' lateral:   5.87 cm/s LVOT diam:     1.90 cm     LV E/e' lateral: 5.3 LV SV:         34 LV SV Index:   20 LVOT Area:     2.84 cm  LV Volumes (MOD) LV vol d, MOD A2C: 52.1 ml LV vol d, MOD A4C: 51.1 ml LV vol s, MOD A2C: 31.5 ml LV vol s, MOD A4C: 26.3 ml LV SV MOD A2C:     20.6 ml LV SV MOD A4C:     51.1 ml LV SV MOD BP:      24.1 ml RIGHT VENTRICLE            IVC RV Basal diam:  2.20 cm    IVC diam: 1.30 cm RV S prime:     7.40 cm/s TAPSE (M-mode): 0.7 cm LEFT ATRIUM             Index        RIGHT ATRIUM           Index LA diam:        3.50 cm 2.00 cm/m   RA Area:     11.40 cm LA Vol (A2C):   31.3 ml 17.88 ml/m  RA Volume:   22.20 ml  12.68 ml/m LA Vol (A4C):   45.0 ml 25.71 ml/m LA Biplane Vol: 40.3 ml 23.02 ml/m  AORTIC VALVE LVOT Vmax:   73.10 cm/s LVOT Vmean:  45.300 cm/s LVOT VTI:    0.121 m  AORTA Ao Root diam: 3.10 cm Ao Asc diam:  3.60 cm MV E velocity: 31.10 cm/s  TRICUSPID VALVE MV A velocity: 74.40 cm/s  TR Peak grad:    24.4 mmHg MV E/A ratio:  0.42        TR Vmax:        247.00 cm/s                             SHUNTS                            Systemic VTI:  0.12 m                            Systemic Diam: 1.90 cm Clearnce Hasten Electronically signed by Clearnce Hasten Signature Date/Time: 06/29/2023/5:37:03 PM    Final    CT Angio Chest  PE W/Cm &/Or Wo Cm  Result Date: 06/29/2023 CLINICAL DATA:  Acute pulmonary embolism suspected. Positive D-dimer. Shortness of breath. EXAM: CT ANGIOGRAPHY CHEST WITH CONTRAST TECHNIQUE: Multidetector CT imaging of the chest was performed using the standard protocol during bolus administration of intravenous contrast. Multiplanar CT image reconstructions and MIPs were obtained to evaluate the vascular anatomy. RADIATION DOSE REDUCTION: This exam was performed according to the departmental dose-optimization program which includes automated exposure control, adjustment of the mA and/or kV according to patient size and/or use of iterative reconstruction technique. CONTRAST:  75mL OMNIPAQUE IOHEXOL 350 MG/ML SOLN COMPARISON:  05/29/2017. FINDINGS: Cardiovascular: Satisfactory opacification of the pulmonary arteries to the segmental level. Examination is positive for acute bilateral pulmonary emboli with saddle embolus involving the right and left main pulmonary arteries. Emboli extend into the segmental pulmonary arteries to the left upper lobe, left lower lobe, right lower lobe and right upper lobe. Right middle lobe pulmonary emboli also noted. The RV to LV ratio is equal to 1.2 compatible with right heart strain. Reflux of contrast material into the IVC and proximal hepatic veins noted. Heart size is normal. Coronary artery calcifications. Aortic atherosclerosis. No pericardial effusion. Mediastinum/Nodes: No enlarged mediastinal, hilar, or axillary lymph nodes. Thyroid gland, trachea, and esophagus demonstrate no significant findings. Lungs/Pleura: There is no pleural effusion, airspace  consolidation, or pneumothorax. Peripheral areas of subsegmental atelectasis noted in the lower lung zones. No consolidative change. Calcified granuloma identified in the left lower lobe. Upper Abdomen: No acute abnormality. Musculoskeletal: Thoracolumbar scoliosis and multilevel degenerative disc disease. No acute or suspicious osseous abnormality. No chest wall mass. Review of the MIP images confirms the above findings. IMPRESSION: 1. Examination is positive for acute bilateral pulmonary emboli with saddle embolus involving the right and left main pulmonary arteries. Emboli extend into the segmental pulmonary arteries to the bilateral upper and lower lobes and right middle lobe pulmonary arteries. 2. Positive for right heart strain (RV/LV Ratio = 1.2) the presence of right heart strain has been associated with an increased risk of morbidity and mortality. 3. Coronary artery calcifications. 4.  Aortic Atherosclerosis (ICD10-I70.0). Critical Value/emergent results were called by telephone at the time of interpretation on 06/29/2023 at 1:50 pm to provider Vanetta Mulders , who verbally acknowledged these results. Electronically Signed   By: Signa Kell M.D.   On: 06/29/2023 13:50   DG Chest 2 View  Result Date: 06/29/2023 CLINICAL DATA:  82 year old female with shortness of breath. Malaise. EXAM: CHEST - 2 VIEW COMPARISON:  Chest radiographs 08/05/2019 and earlier. FINDINGS: PA and lateral views 1149 hours. Stable tortuosity of the thoracic aorta. Other mediastinal contours are within normal limits. Stable lung volumes. Both lungs appear clear. No pneumothorax or pleural effusion. Visualized tracheal air column is within normal limits. No acute osseous abnormality identified. Exaggerated thoracic kyphosis. Negative visible bowel gas. IMPRESSION: No acute cardiopulmonary abnormality. Electronically Signed   By: Odessa Fleming M.D.   On: 06/29/2023 13:21     Assessment & Plan:   Acute pulmonary embolism (HCC) -  Admitted in October for acute bilateral PE with saddle embolus and CT evidence of right heart strain. Continue anticoagulation for minimum 3-6 months. It's not completely clear if PE was provoked or not, patient had a long car ride back in May but reports frequent breaks along the way. She has been referred to hematology by primary care. Needs echocardiogram in January for follow-up on RV function.   CAD in native artery - Continue Repatha as directed  Glenford Bayley, NP 07/16/2023

## 2023-07-16 NOTE — Assessment & Plan Note (Signed)
-   Admitted in October for acute bilateral PE with saddle embolus and CT evidence of right heart strain. Continue anticoagulation for minimum 3-6 months. It's not completely clear if PE was provoked or not, patient had a long car ride back in May but reports frequent breaks along the way. She has been referred to hematology by primary care. Needs echocardiogram in January for follow-up on RV function.

## 2023-07-16 NOTE — Assessment & Plan Note (Addendum)
-   Continue Repatha as directed

## 2023-07-20 ENCOUNTER — Ambulatory Visit: Payer: Medicare Other | Admitting: Primary Care

## 2023-07-25 ENCOUNTER — Encounter: Payer: Self-pay | Admitting: Internal Medicine

## 2023-08-08 ENCOUNTER — Ambulatory Visit: Payer: Medicare Other | Admitting: Family Medicine

## 2023-08-08 ENCOUNTER — Encounter: Payer: Self-pay | Admitting: Family Medicine

## 2023-08-08 VITALS — BP 124/74 | Ht 65.0 in | Wt 147.0 lb

## 2023-08-08 DIAGNOSIS — M461 Sacroiliitis, not elsewhere classified: Secondary | ICD-10-CM

## 2023-08-08 DIAGNOSIS — M25562 Pain in left knee: Secondary | ICD-10-CM | POA: Diagnosis not present

## 2023-08-08 DIAGNOSIS — M170 Bilateral primary osteoarthritis of knee: Secondary | ICD-10-CM

## 2023-08-08 NOTE — Assessment & Plan Note (Signed)
Chronic problem with another exacerbation.  Due to patient being on a blood thinner decreases the likelihood of different treatment options.  Discussed with patient about icing regimen and home exercises, increase activity slowly otherwise.  Follow-up with me again in 6 to 8 weeks

## 2023-08-08 NOTE — Progress Notes (Signed)
[name] [address] [place] [address] Phone: [phone] Subjective:   [name], am serving as a scribe for Dr. Antoine Primas.  I'm seeing this patient by the request  of:  Alysia Penna, MD  CC: Left knee pain follow-up  [id number]  05/23/2023 Bilateral injections given today.  Tolerated the procedure well.  Hopeful that this will make some improvement.  Discussed with patient's that otherwise I do think that the patient could be a candidate for medial branch blocks with some of the facet arthropathy.  Increase activity slowly otherwise.  Follow-up again in 6 to 8 weeks otherwise.     Chronic, with worsening symptoms.  Discussed icing regimen and home exercises, discussed which activities to do and which ones to avoid.  Increase activity slowly over the course of next several weeks.  Discussed which activities to do and which ones to avoid.  Increase activity slowly.  Follow-up again in 6 to 8 weeks    Update 08/08/2023 [name] is a 82 y.o. female coming in with complaint of B knee pain. Patient states injections from September did well. Starting the wear off. Knee is doing okay.  Giving her some discomfort.  Starting to affect daily activities again.  Only the right side though.  Since we have seen patient did have a saddle embolism and is to continue anticoagulations for a total of 3 months.     Past Medical History:  Diagnosis Date   Arthritis    Asthma    CAD in native artery 06/06/2017   Mild LAD disease on coronary CT-A 05/2017.   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   Thyroid disease    TIA (transient ischemic attack)    2017   Transaminitis  09/15/2020   Past Surgical History:  Procedure Laterality Date   ABDOMINAL HYSTERECTOMY     BLADDER SURGERY     hole in bladder from hysterectomy   BREAST SURGERY     biopsy breast benign   EYE SURGERY     bil cataract   TOTAL HIP ARTHROPLASTY Right 08/06/2019   Procedure: RIGHT TOTAL HIP ARTHROPLASTY ANTERIOR APPROACH;  Surgeon: Marcene Corning, MD;  Location: [facility];  Service: Orthopedics;  Laterality: Right;   TOTAL THYROIDECTOMY     Social History   Socioeconomic History   Marital status: Media planner    Spouse name: Okey Regal   Number of children: Not on file   Years of education: Not on file   Highest education level: Master's degree (e.g., MA, MS, MEng, MEd, MSW, MBA)  Occupational History   Not on file  Tobacco Use   Smoking status: Never   Smokeless tobacco: Never  Vaping Use   Vaping status: Never Used  Substance and Sexual Activity   Alcohol use: Yes    Comment: 12/02/21 3 daily   Drug use: No   Sexual activity: Not Currently  Other Topics Concern   Not on file  Social History Narrative   Lives with partner   Social Determinants of Health   Financial Resource Strain: Not on file  Food Insecurity: No Food Insecurity (06/29/2023)  Hunger Vital Sign    Worried About Running Out of Food in the Last Year: Never true    Ran Out of Food in the Last Year: Never true  Transportation Needs: No Transportation Needs (06/29/2023)   PRAPARE - Administrator, Civil Service (Medical): No    Lack of Transportation (Non-Medical): No  Physical Activity: Not on file  Stress: Not on file  Social Connections: Not on file   Allergies  Allergen Reactions   Losartan Anaphylaxis    Diffuse hives and tongue swelling   Propantheline Rash    Cannot remember other symptoms    Atorvastatin     Myalgia    Gluten Meal Diarrhea    bloating   Rosuvastatin    Sulfa Antibiotics Swelling   Zetia [Ezetimibe]     Myalgias    Demerol [Meperidine] Rash   Morphine And  Codeine Rash   Family History  Problem Relation Age of Onset   Peripheral Artery Disease Mother    Dementia Father     Current Outpatient Medications (Endocrine & Metabolic):    levothyroxine (SYNTHROID) 125 MCG tablet, Take 125 mcg by mouth daily before breakfast.   Current Outpatient Medications (Respiratory):    diphenhydrAMINE (BENADRYL) 25 MG tablet, Take 1 tablet (25 mg total) by mouth every 8 (eight) hours as needed for up to 21 doses for allergies or sleep.   Current Outpatient Medications (Hematological):    APIXABAN (ELIQUIS) VTE STARTER PACK (10MG  AND 5MG ), Take as directed on package: start with two-5mg  tablets twice daily for 7 days. On day 8, switch to one-5mg  tablet twice daily.  Current Outpatient Medications (Other):    cholecalciferol (VITAMIN D) 1000 units tablet, Take 1,000 Units by mouth daily.   Multiple Vitamins-Minerals (MULTIVITAMIN WITH MINERALS) tablet, Take 1 tablet by mouth daily.   Multiple Vitamins-Minerals (PRESERVISION AREDS 2 PO), Take 1 tablet by mouth daily.   Reviewed prior external information including notes and imaging from  primary care provider As well as notes that were available from care everywhere and other healthcare systems.  Past medical history, social, surgical and family history all reviewed in electronic medical record.  No pertanent information unless stated regarding to the chief complaint.   Review of Systems:  No headache, visual changes, nausea, vomiting, diarrhea, constipation, dizziness, abdominal pain, skin rash, fevers, chills, night sweats, weight loss, swollen lymph nodes, body aches, joint swelling, chest pain, shortness of breath, mood changes. POSITIVE muscle aches  Objective  Blood pressure 124/74, height 5\' 5"  (1.651 m), weight 147 lb (66.7 kg), SpO2 96%.   General: No apparent distress alert and oriented x3 mood and affect normal, dressed appropriately.  HEENT: Pupils equal, extraocular movements intact   Respiratory: Patient's speak in full sentences and does not appear short of breath  Cardiovascular: No significant swelling noted at this time. Left knee exam shows  After informed written and verbal consent, patient was seated on exam table. Left knee was prepped with alcohol swab and utilizing anterolateral approach, patient's left knee space was injected with 4:1  marcaine 0.5%: Kenalog 40mg /dL. Patient tolerated the procedure well without immediate complications.  After verbal consent patient was prepped with alcohol swab and with a 21-gauge 2 inch needle injected into the right sacroiliac joint with a total of 0.5 cc of 0.5% Marcaine and 1 cc of Kenalog 40 mg/mL.  No blood loss.  Band-Aid placed.  Postinjection instructions given.    Impression and Recommendations:     The above  documentation has been reviewed and is accurate and complete Judi Saa, DO

## 2023-08-08 NOTE — Assessment & Plan Note (Signed)
Right second injection today.  Tolerated the procedure well, discussed with patient about monitoring for any swelling I think patient is doing fine given that she is on Eliquis.  I did warn patient that if this does get diagnosed as an unprovoked blood clot there is a possibility Eliquis will be needed lifelong.  Otherwise there is a chance that it will be only 3 to 6 months.  Patient is going to talk to her ophthalmologist on a more regular basis as well and we will see if we can find any other information so she can make a educated decision if she is going to continue the injections.

## 2023-08-08 NOTE — Patient Instructions (Signed)
Talk to hematologist on Friday Injections today See you again in 2-3 months

## 2023-08-11 ENCOUNTER — Inpatient Hospital Stay: Payer: Medicare Other | Attending: Hematology | Admitting: Hematology

## 2023-08-11 ENCOUNTER — Other Ambulatory Visit: Payer: Self-pay

## 2023-08-11 ENCOUNTER — Inpatient Hospital Stay: Payer: Medicare Other

## 2023-08-11 VITALS — BP 138/80 | HR 63 | Temp 97.2°F | Resp 18 | Wt 146.6 lb

## 2023-08-11 DIAGNOSIS — I700 Atherosclerosis of aorta: Secondary | ICD-10-CM | POA: Insufficient documentation

## 2023-08-11 DIAGNOSIS — M25461 Effusion, right knee: Secondary | ICD-10-CM | POA: Diagnosis not present

## 2023-08-11 DIAGNOSIS — D696 Thrombocytopenia, unspecified: Secondary | ICD-10-CM | POA: Insufficient documentation

## 2023-08-11 DIAGNOSIS — I82411 Acute embolism and thrombosis of right femoral vein: Secondary | ICD-10-CM | POA: Insufficient documentation

## 2023-08-11 DIAGNOSIS — I2510 Atherosclerotic heart disease of native coronary artery without angina pectoris: Secondary | ICD-10-CM | POA: Diagnosis not present

## 2023-08-11 DIAGNOSIS — D751 Secondary polycythemia: Secondary | ICD-10-CM | POA: Insufficient documentation

## 2023-08-11 DIAGNOSIS — Z90710 Acquired absence of both cervix and uterus: Secondary | ICD-10-CM | POA: Diagnosis not present

## 2023-08-11 DIAGNOSIS — Z7901 Long term (current) use of anticoagulants: Secondary | ICD-10-CM | POA: Insufficient documentation

## 2023-08-11 DIAGNOSIS — I2602 Saddle embolus of pulmonary artery with acute cor pulmonale: Secondary | ICD-10-CM

## 2023-08-11 DIAGNOSIS — Z85828 Personal history of other malignant neoplasm of skin: Secondary | ICD-10-CM | POA: Diagnosis not present

## 2023-08-11 LAB — CBC WITH DIFFERENTIAL (CANCER CENTER ONLY)
Abs Immature Granulocytes: 0.02 10*3/uL (ref 0.00–0.07)
Basophils Absolute: 0 10*3/uL (ref 0.0–0.1)
Basophils Relative: 0 %
Eosinophils Absolute: 0 10*3/uL (ref 0.0–0.5)
Eosinophils Relative: 1 %
HCT: 46.5 % — ABNORMAL HIGH (ref 36.0–46.0)
Hemoglobin: 15.7 g/dL — ABNORMAL HIGH (ref 12.0–15.0)
Immature Granulocytes: 0 %
Lymphocytes Relative: 32 %
Lymphs Abs: 1.9 10*3/uL (ref 0.7–4.0)
MCH: 32.6 pg (ref 26.0–34.0)
MCHC: 33.8 g/dL (ref 30.0–36.0)
MCV: 96.7 fL (ref 80.0–100.0)
Monocytes Absolute: 0.4 10*3/uL (ref 0.1–1.0)
Monocytes Relative: 7 %
Neutro Abs: 3.6 10*3/uL (ref 1.7–7.7)
Neutrophils Relative %: 60 %
Platelet Count: 328 10*3/uL (ref 150–400)
RBC: 4.81 MIL/uL (ref 3.87–5.11)
RDW: 12 % (ref 11.5–15.5)
WBC Count: 6.1 10*3/uL (ref 4.0–10.5)
nRBC: 0 % (ref 0.0–0.2)

## 2023-08-11 LAB — CMP (CANCER CENTER ONLY)
ALT: 18 U/L (ref 0–44)
AST: 16 U/L (ref 15–41)
Albumin: 4.4 g/dL (ref 3.5–5.0)
Alkaline Phosphatase: 59 U/L (ref 38–126)
Anion gap: 7 (ref 5–15)
BUN: 12 mg/dL (ref 8–23)
CO2: 29 mmol/L (ref 22–32)
Calcium: 9.8 mg/dL (ref 8.9–10.3)
Chloride: 103 mmol/L (ref 98–111)
Creatinine: 0.73 mg/dL (ref 0.44–1.00)
GFR, Estimated: >60 mL/min (ref >60)
Glucose, Bld: 82 mg/dL (ref 70–99)
Potassium: 4.1 mmol/L (ref 3.5–5.1)
Sodium: 139 mmol/L (ref 135–145)
Total Bilirubin: 0.6 mg/dL (ref <1.2)
Total Protein: 7 g/dL (ref 6.5–8.1)

## 2023-08-11 LAB — LACTATE DEHYDROGENASE: LDH: 141 U/L (ref 98–192)

## 2023-08-11 NOTE — Progress Notes (Signed)
HEMATOLOGY/ONCOLOGY CONSULTATION NOTE  Date of Service: 08/11/2023  Patient Care Team: Alysia Penna, MD as PCP - General (Internal Medicine) Chilton Si, MD as PCP - Cardiology (Cardiology) Duke, Roe Rutherford, PA as Physician Assistant (Cardiology)  CHIEF COMPLAINTS/PURPOSE OF CONSULTATION:  Evaluation and management of acute saddle pulmonary embolism with acute cor pulmonale, acute deep vein thrombosis of femoral vein of right lower extremity, and thrombocytopenia.   HISTORY OF PRESENTING ILLNESS:  Julie [name] is a wonderful 82 y.o. female who has been referred to [place] by Jarome Lamas, AG-NP for evaluation and management of acute saddle pulmonary embolism with acute cor pulmonale, acute deep vein thrombosis of femoral vein of right lower extremity, and thrombocytopenia.  Today, she is accompanied by her significant other. Patient reports no history of blood clots in past prior to recent findings. She denies any fhx of blood clots in the past. She reports that her hgb has not been high prior to recently.   Patient reports that she endorsed SOB for the first time when she presented to the ER. She reports experiencing sudden SOB when walking up a hill which she had climbed previously for years with no issues. Patient reports that she is quite active in general and does go camping frequently.   She reports engaging in a long car ride of 4.5 hours prior to the event and notes that she took frequent rest stops every 2 hours.   She is currently Eliquis 5MG  twice a day at this time. Patient reports that she recently started using compression socks.   Patient reports that she received hip replacement surgery 4 years ago which did not trigger a blood clot. She has never been pregnant or had children. Patient has never used any hormonal birth control pills.   Patient reports previous travel arrangements in May of this year involving a train ride for 30 hours and [place], and notes no issues. She reports that she moved around frequently during the train ride and her bus did make frequent stops.   Patient reports that she did have a COVID-19 infection in May after her bus ride. Her covid infection was fairly mild and she had no new leg symptoms or SOB at that time.   Patient reports that her right knee cap is out of position by about 30%, and notes that this issue has been present for a while. She complains of constant swelling in her right knee. She wears a knee brace with certain activities such as playing golf. Patient reports one event in September, in which her knee was more swollen than usual while playing golf, about 6 weeks prior to her blood clot. She did not engage in any new/different activity and the area was not painful. She had no SOB or chest pain at that time. Patient iced the area to manage symptoms.   Patient denies any sudden weight loss, fever, chills, drenching night sweats, new lumps/bumps, change in bowel habits, or change in urinary habits. She denies any issues of varicose veins.   She reports that there have been no concerns on her previous  cancer screenings and generally stays UTD with her age-appropriate cancer screenings.   Patient does have a history of TIA in 2017. She does endorse celiac disease and reports that she regularly avoids gluten-containing foods. Patient does endorse hypothyroidism and notes that her thyroid was removed in the 1980s due to having a nodule, which was not noted to be cancerous. She  has since been started on Synthroid.   Patient is a never smoker. She reports that she generally does not drink adequate levels of water. She reports that she is not on a diuretic at this time.   Patient reports endorsing diarrhea periodically. 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   Patient notes some abdominal tenderness on palpation, but denies any abdominal pain otherwise.   She reports that her mother passed away  from hardening of the arteries.   Patient does have a history of skin cancer and denies any other cancers or other fhx of cancer.   Patient reports that she endorses macular degeneration and received Eylea injections on four occasions. She reports that she did have bleeding inside the eye on two occasions from [facility] injections. Patient reports that she is currently due for shots in both of her eyes in February.   She reports that she has an echocardiogram scheduled for January 9th, 2025, ordered by her pulmonologist, Dr. Judeth Horn.   She denies an upcoming long-distance travel plans for the holidays. Patient is planning to travel to [place] in February.   MEDICAL HISTORY:  Past Medical History:  Diagnosis Date   Arthritis    Asthma    CAD in native artery 06/06/2017   Mild LAD disease on coronary CT-A 05/2017.   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   Thyroid disease    TIA (transient ischemic attack)    2017   Transaminitis 09/15/2020    SURGICAL HISTORY: Past Surgical History:  Procedure Laterality Date   ABDOMINAL HYSTERECTOMY     BLADDER SURGERY     hole in bladder from hysterectomy   BREAST SURGERY     biopsy breast benign   EYE SURGERY     bil cataract   TOTAL HIP ARTHROPLASTY Right 08/06/2019   Procedure: RIGHT TOTAL HIP ARTHROPLASTY ANTERIOR APPROACH;  Surgeon: Marcene Corning, MD;  Location: [facility];  Service: Orthopedics;  Laterality: Right;   TOTAL THYROIDECTOMY      SOCIAL HISTORY: Social History   Socioeconomic History   Marital status: Media planner    Spouse name: Okey Regal   Number of children: Not on file   Years of education: Not on file   Highest education level: Master's degree  (e.g., MA, MS, MEng, MEd, MSW, MBA)  Occupational History   Not on file  Tobacco Use   Smoking status: Never   Smokeless tobacco: Never  Vaping Use   Vaping status: Never Used  Substance and Sexual Activity   Alcohol use: Yes    Comment: 12/02/21 3 daily   Drug use: No   Sexual activity: Not Currently  Other Topics Concern   Not on file  Social History Narrative   Lives with partner   Social Determinants of Health   Financial Resource Strain: Not on file  Food Insecurity: No Food Insecurity (06/29/2023)   Hunger Vital Sign    Worried About Running Out of Food in the Last Year: Never true    Ran Out of Food in the Last Year: Never true  Transportation Needs: No Transportation Needs (06/29/2023)   PRAPARE - Transportation    Lack of Transportation (  Medical): No    Lack of Transportation (Non-Medical): No  Physical Activity: Not on file  Stress: Not on file  Social Connections: Not on file  Intimate Partner Violence: Not At Risk (06/29/2023)   Humiliation, Afraid, Rape, and Kick questionnaire    Fear of Current or Ex-Partner: No    Emotionally Abused: No    Physically Abused: No    Sexually Abused: No    FAMILY HISTORY: Family History  Problem Relation Age of Onset   Peripheral Artery Disease Mother    Dementia Father     ALLERGIES:  is allergic to losartan, propantheline, atorvastatin, gluten meal, rosuvastatin, sulfa antibiotics, zetia [ezetimibe], demerol [meperidine], and morphine and codeine.  MEDICATIONS:  Current Outpatient Medications  Medication Sig Dispense Refill   APIXABAN (ELIQUIS) VTE STARTER PACK (10MG  AND 5MG ) Take as directed on package: start with two-5mg  tablets twice daily for 7 days. On day 8, switch to one-5mg  tablet twice daily. 74 each 0   cholecalciferol (VITAMIN D) 1000 units tablet Take 1,000 Units by mouth daily.     diphenhydrAMINE (BENADRYL) 25 MG tablet Take 1 tablet (25 mg total) by mouth every 8 (eight) hours as needed for up to 21  doses for allergies or sleep. 21 tablet 0   levothyroxine (SYNTHROID) 125 MCG tablet Take 125 mcg by mouth daily before breakfast.     Multiple Vitamins-Minerals (MULTIVITAMIN WITH MINERALS) tablet Take 1 tablet by mouth daily.     Multiple Vitamins-Minerals (PRESERVISION AREDS 2 PO) Take 1 tablet by mouth daily.     No current facility-administered medications for this visit.    REVIEW OF SYSTEMS:    10 Point review of Systems was done is negative except as noted above.  PHYSICAL EXAMINATION: ECOG PERFORMANCE STATUS: {CHL ONC ECOG ZO:[id number]}  .There were no vitals filed for this visit. There were no vitals filed for this visit. .There is no height or weight on file to calculate BMI.  GENERAL:alert, in no acute distress and comfortable SKIN: no acute rashes, no significant lesions EYES: conjunctiva are pink and non-injected, sclera anicteric OROPHARYNX: MMM, no exudates, no oropharyngeal erythema or ulceration NECK: supple, no JVD LYMPH:  no palpable lymphadenopathy in the cervical, axillary or inguinal regions LUNGS: clear to auscultation b/l with normal respiratory effort HEART: regular rate & rhythm ABDOMEN:  normoactive bowel sounds , non tender, not distended. Extremity: no pedal edema PSYCH: alert & oriented x 3 with fluent speech NEURO: no focal motor/sensory deficits  LABORATORY DATA:  I have reviewed the data as listed  .    Latest Ref Rng & Units 07/02/2023    3:23 AM 07/01/2023    3:03 AM 06/30/2023    4:38 AM  CBC  WBC 4.0 - 10.5 K/uL 5.6  7.0  6.7   Hemoglobin 12.0 - 15.0 g/dL 81.1  91.4  78.2   Hematocrit 36.0 - 46.0 % 38.9  40.2  41.6   Platelets 150 - 400 K/uL 149  137  129     .    Latest Ref Rng & Units 06/30/2023    6:40 AM 06/29/2023   11:10 AM 12/29/2021    8:02 AM  CMP  Glucose 70 - 99 mg/dL 956  213  91   BUN 8 - 23 mg/dL 9  12  12    Creatinine 0.44 - 1.00 mg/dL 0.86  5.78  4.69   Sodium 135 - 145 mmol/L 137  134  142   Potassium  3.5 - 5.1 mmol/L 4.2  4.4  4.6   Chloride 98 - 111 mmol/L 102  101  105   CO2 22 - 32 mmol/L 25  25  24    Calcium 8.9 - 10.3 mg/dL 8.2  8.5  8.9   Total Protein 6.5 - 8.1 g/dL 5.5  6.6  5.7   Total Bilirubin 0.3 - 1.2 mg/dL 1.0  1.0  0.7   Alkaline Phos 38 - 126 U/L 67  76  82   AST 15 - 41 U/L 22  31  19    ALT 0 - 44 U/L 26  28  20       RADIOGRAPHIC STUDIES: I have personally reviewed the radiological images as listed and agreed with the findings in the report. No results found.  ASSESSMENT & PLAN:  82 y.o. female with:  acute saddle pulmonary embolism with acute cor pulmonale acute deep vein thrombosis of femoral vein of right lower extremity thrombocytopenia  PLAN:  -CBC from 07/02/2023 showed WBC of 5.6K, hemoglobin slightly high at 13.4, and platelets of 149K. -discussed that there is a role to monitor her hgb to make sure that her elevated hgb is not persistent -discussed that if her hgb remains high, this can be associated with polycythemic vera which is a risk factor for blood clots. It is possible that patient may have been dehydrated.  -Prostin S normal -antithrombin III levels normal -protein C normal -factor 5 ledin mutation and prothrombin gene mutation testing was also done recently -CT Angio Chest PE scan on 06/29/2023 showed: 1. Examination is positive for acute bilateral pulmonary emboli with saddle embolus involving the right and left main pulmonary arteries. Emboli extend into the segmental pulmonary arteries to the bilateral upper and lower lobes and right middle lobe pulmonary arteries. 2. Positive for right heart strain (RV/LV Ratio = 1.2) the presence of right heart strain has been associated with an increased risk of morbidity and mortality. 3. Coronary artery calcifications. 4.  Aortic Atherosclerosis (ICD10-I70.0). -blood clots in legs did not appear to be a new process -unlikely that her extensive blood clots would be due to her 4.5 hour car ride  with rest stops every 2 hours.  -educated patient that inflammation of superficial vein could also sometimes be a risk factor for blood clots -educated patient on potential causes of blood clots  -discussed that eye infections generally should not cause systemic effects. Unsure if this would be a risk factor for systemic clots -discussed that after 6 months, we shall make a decision regarding whether she should be on full or preventative dose of blood thinners based on findings.  -recommend to re-evaluate her veins after at least 3-4 months of blood thinners -discussed that any localized symptoms or concerns for cancer would be reason to consider scan -Patient has no obvious suggestion of cancer at this time -recommend patient to drink 64 ounces of water a day -recommend patient to regularly use compression socks, move frequently every 1-2 hours, and avoid alcohol during long distance travel -will order blood tests including genetic testing and anticardiolipid antibodies testing -patient has an echocardiogram scheduled for January 9th, 2025, ordered by Dr. Judeth Horn.  -Patient has history of Vitreous hemorrhage on two occasions with Eylea injections -discussed that if her eye doctor feels that the risk of bleeding is too high from a local perspective, there may be a role to hold injections -recommend to delay Eylea injections until patient has been on blood thinners for at least 6 months, then hold blood thinners around the  time of Eylea injections -advised patient to connect with her eye doctor to discuss the risks vs benefits of Eylea. Discussed that risks of bleeding and clotting would need to be considered.  -answered all of patient's and her significant other's questions in detail -will plan for phone visit in a couple of weeks  FOLLOW-UP: Labs today Phone visit with dr Candise Che in 2 weeks  The total time spent in the appointment was *** minutes* .  All of the patient's questions were  answered with apparent satisfaction. The patient knows to call the clinic with any problems, questions or concerns.   Wyvonnia Lora MD MS AAHIVMS [facility] Hematology/Oncology Physician [facility]  .*Total Encounter Time as defined by the Centers for Medicare and Medicaid Services includes, in addition to the face-to-face time of a patient visit (documented in the note above) non-face-to-face time: obtaining and reviewing outside history, ordering and reviewing medications, tests or procedures, care coordination (communications with other health care professionals or caregivers) and documentation in the medical record.    I,Mitra Faeizi,acting as a Neurosurgeon for Wyvonnia Lora, MD.,have documented all relevant documentation on the behalf of Wyvonnia Lora, MD,as directed by  Wyvonnia Lora, MD while in the presence of Wyvonnia Lora, MD.  ***

## 2023-08-12 LAB — DRVVT CONFIRM: dRVVT Confirm: 1 {ratio} (ref 0.8–1.2)

## 2023-08-12 LAB — LUPUS ANTICOAGULANT PANEL
DRVVT: 55.3 s — ABNORMAL HIGH (ref 0.0–47.0)
PTT Lupus Anticoagulant: 38.5 s (ref 0.0–43.5)

## 2023-08-12 LAB — DRVVT MIX: dRVVT Mix: 45.1 s — ABNORMAL HIGH (ref 0.0–40.4)

## 2023-08-13 LAB — BETA-2-GLYCOPROTEIN I ABS, IGG/M/A
Beta-2 Glyco I IgG: <9 GPI IgG units (ref 0–20)
Beta-2-Glycoprotein I IgA: <9 GPI IgA units (ref 0–25)
Beta-2-Glycoprotein I IgM: <9 GPI IgM units (ref 0–32)

## 2023-08-14 ENCOUNTER — Telehealth: Payer: Self-pay | Admitting: Hematology

## 2023-08-14 LAB — CARDIOLIPIN ANTIBODIES, IGG, IGM, IGA
Anticardiolipin IgA: <9 [APL'U]/mL (ref 0–11)
Anticardiolipin IgG: <9 [GPL'U]/mL (ref 0–14)
Anticardiolipin IgM: <9 [MPL'U]/mL (ref 0–12)

## 2023-08-14 NOTE — Telephone Encounter (Signed)
Spoke with patient confirming upcoming appointment  

## 2023-08-20 LAB — MISC LABCORP TEST (SEND OUT): Labcorp test code: 489514

## 2023-08-28 ENCOUNTER — Inpatient Hospital Stay (HOSPITAL_BASED_OUTPATIENT_CLINIC_OR_DEPARTMENT_OTHER): Payer: Medicare Other | Admitting: Hematology

## 2023-08-28 DIAGNOSIS — I2602 Saddle embolus of pulmonary artery with acute cor pulmonale: Secondary | ICD-10-CM | POA: Diagnosis not present

## 2023-08-28 DIAGNOSIS — I82411 Acute embolism and thrombosis of right femoral vein: Secondary | ICD-10-CM

## 2023-08-28 NOTE — Progress Notes (Signed)
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   HISTORY OF PRESENTING ILLNESS:   Julie [name] is a wonderful 82 y.o. female who has been referred to [place] by Jarome Lamas, AG-NP for evaluation and management of acute saddle pulmonary embolism with acute cor pulmonale, acute deep vein thrombosis of femoral vein of right lower extremity, and thrombocytopenia.  Today, she is accompanied by her significant other. Patient reports no history of blood clots in past prior to recent findings. She denies any fhx of blood clots in the past. She reports that her hgb has not been high prior to recently.   Patient reports that she endorsed SOB for the first time when she presented to the ER. She reports experiencing sudden SOB when walking up a hill which she had climbed previously for years with no issues. Patient reports that she is quite active in general and does go camping frequently.   She reports engaging in a long car ride of 4.5 hours prior to the event and notes that she took frequent rest stops every 2 hours.   She is currently Eliquis 5MG  twice a day at this time. Patient reports that she recently started using compression socks.   Patient reports that she received hip replacement surgery 4 years ago which did not trigger a blood clot. She has never been pregnant or had children. Patient has never used any hormonal birth control pills.   Patient reports previous travel arrangements in May of this year involving a train ride for 30 hours and [place], and notes no issues. She reports that she moved around frequently during the train ride and her bus did make frequent stops.   Patient reports that she did have a COVID-19 infection in May after her bus ride. Her covid infection was fairly mild and she had no new leg symptoms or SOB at that time.   Patient reports that her right knee cap is out of position by about 30%, and notes that this issue has been present for a while. She complains of constant swelling in her right knee. She wears a knee brace with certain activities such as playing golf. Patient reports one event in September, in which her knee was more swollen than usual while playing golf, about 6 weeks prior to her blood clot. She did not engage in any new/different activity and the area was not painful. She had no SOB or chest pain at that time. Patient iced the area to manage symptoms.   Patient denies any sudden weight loss, fever, chills, drenching night sweats, new lumps/bumps, change in bowel habits, or change in urinary habits. She denies any issues of varicose veins.   She reports that there have been no concerns on her previous  cancer screenings and generally stays UTD with her age-appropriate cancer screenings.   Patient does have a history of TIA in 2017. She does endorse celiac disease and reports that she regularly avoids gluten-containing foods. Patient does endorse hypothyroidism and notes that her thyroid was removed in the 1980s due to having a nodule, which was not noted to be  cancerous. She has since been started on Synthroid.   Patient is a never smoker. She reports that she generally does not drink adequate levels of water. She reports that she is not on a diuretic at this time.   Patient reports endorsing diarrhea periodically. 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   Patient notes some abdominal tenderness on palpation, but denies any abdominal pain otherwise.   She reports that her mother passed  away from hardening of the arteries.   Patient does have a history of skin cancer and denies any other cancers or other fhx of cancer.   Patient reports that she endorses macular degeneration and received Eylea injections on four occasions. She reports that she did have bleeding inside the eye on two occasions from [facility] injections. Patient reports that she is currently due for shots in both of her eyes in February.   She reports that she has an echocardiogram scheduled for January 9th, 2025, ordered by her pulmonologist, Dr. Judeth Horn.   She denies an upcoming long-distance travel plans for the holidays. Patient is planning to travel to [place] in February.   INTERVAL HISTORY: Julie [name] is a wonderful 82 y.o. 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 Patient was initially seen by me on 08/11/2023.   .I connected with [name] on 08/28/2023 at  8:40 AM EST by telephone visit and verified that I am speaking with the correct person using two identifiers.   Patient notes she has been doing well overall since our last visit. She complains of mild SOB when she is walking uphill. She denies chest pain, new infection issues, fever, chills, night sweats, abdominal pain, or back pain. She still complains of bilateral leg swelling, but notes it has been improving.   She has been tolerating her Eliquis well without any new or severe toxicities.   Discussed lab results from 08/11/2023 in detail with the patient.   I discussed the limitations, risks, security and privacy concerns of performing an evaluation and management service by telemedicine and the availability of in-person appointments. I also discussed with the patient that there may be a patient responsible charge related to this service. The patient expressed understanding and  agreed to proceed.   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     MEDICAL HISTORY:  Past Medical History:  Diagnosis Date   Arthritis    Asthma    CAD in native artery 06/06/2017   Mild LAD disease on coronary CT-A 05/2017.   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   Thyroid disease    TIA (transient ischemic attack)    2017   Transaminitis 09/15/2020    SURGICAL HISTORY: Past Surgical History:  Procedure Laterality Date   ABDOMINAL HYSTERECTOMY     BLADDER SURGERY  hole in bladder from hysterectomy   BREAST SURGERY     biopsy breast benign   EYE SURGERY     bil cataract   TOTAL HIP ARTHROPLASTY Right 08/06/2019   Procedure: RIGHT TOTAL HIP ARTHROPLASTY ANTERIOR APPROACH;  Surgeon: Marcene Corning, MD;  Location: [facility];  Service: Orthopedics;  Laterality: Right;   TOTAL THYROIDECTOMY      SOCIAL HISTORY: Social History   Socioeconomic History   Marital status: Media planner    Spouse name: Okey Regal   Number of children: Not on file   Years of education: Not on file   Highest education level: Master's degree (e.g., MA, MS, MEng, MEd, MSW, MBA)  Occupational History   Not on file  Tobacco Use   Smoking status: Never   Smokeless tobacco: Never  Vaping Use   Vaping status: Never Used  Substance and Sexual Activity   Alcohol use: Yes    Comment: 12/02/21 3 daily   Drug use: No   Sexual activity: Not Currently  Other Topics Concern    Not on file  Social History Narrative   Lives with partner   Social Drivers of Health   Financial Resource Strain: Not on file  Food Insecurity: No Food Insecurity (06/29/2023)   Hunger Vital Sign    Worried About Running Out of Food in the Last Year: Never true    Ran Out of Food in the Last Year: Never true  Transportation Needs: No Transportation Needs (06/29/2023)   PRAPARE - Administrator, Civil Service (Medical): No    Lack of Transportation (Non-Medical): No  Physical Activity: Not on file  Stress: Not on file  Social Connections: Not on file  Intimate Partner Violence: Not At Risk (06/29/2023)   Humiliation, Afraid, Rape, and Kick questionnaire    Fear of Current or Ex-Partner: No    Emotionally Abused: No    Physically Abused: No    Sexually Abused: No    FAMILY HISTORY: Family History  Problem Relation Age of Onset   Peripheral Artery Disease Mother    Dementia Father     ALLERGIES:  is allergic to losartan, propantheline, atorvastatin, gluten meal, rosuvastatin, sulfa antibiotics, zetia [ezetimibe], demerol [meperidine], and morphine and codeine.  MEDICATIONS:  Current Outpatient Medications  Medication Sig Dispense Refill   APIXABAN (ELIQUIS) VTE STARTER PACK (10MG  AND 5MG ) Take as directed on package: start with two-5mg  tablets twice daily for 7 days. On day 8, switch to one-5mg  tablet twice daily. 74 each 0   cholecalciferol (VITAMIN D) 1000 units tablet Take 1,000 Units by mouth daily.     diphenhydrAMINE (BENADRYL) 25 MG tablet Take 1 tablet (25 mg total) by mouth every 8 (eight) hours as needed for up to 21 doses for allergies or sleep. 21 tablet 0   levothyroxine (SYNTHROID) 125 MCG tablet Take 125 mcg by mouth daily before breakfast.     Multiple Vitamins-Minerals (MULTIVITAMIN WITH MINERALS) tablet Take 1 tablet by mouth daily.     Multiple Vitamins-Minerals (PRESERVISION AREDS 2 PO) Take 1 tablet by mouth daily.     No current  facility-administered medications for this visit.    REVIEW OF SYSTEMS:    10 Point review of Systems was done is negative except as noted above.  PHYSICAL EXAMINATION: TELE-MED VISIT  LABORATORY DATA:  I have reviewed the data as listed  .    Latest Ref Rng & Units 08/11/2023   11:58 AM 07/02/2023    3:23 AM 07/01/2023  3:03 AM  CBC  WBC 4.0 - 10.5 K/uL 6.1  5.6  7.0   Hemoglobin 12.0 - 15.0 g/dL 16.1  09.6  04.5   Hematocrit 36.0 - 46.0 % 46.5  38.9  40.2   Platelets 150 - 400 K/uL 328  149  137     .    Latest Ref Rng & Units 08/11/2023   11:58 AM 06/30/2023    6:40 AM 06/29/2023   11:10 AM  CMP  Glucose 70 - 99 mg/dL 82  409  811   BUN 8 - 23 mg/dL 12  9  12    Creatinine 0.44 - 1.00 mg/dL 9.14  7.82  9.56   Sodium 135 - 145 mmol/L 139  137  134   Potassium 3.5 - 5.1 mmol/L 4.1  4.2  4.4   Chloride 98 - 111 mmol/L 103  102  101   CO2 22 - 32 mmol/L 29  25  25    Calcium 8.9 - 10.3 mg/dL 9.8  8.2  8.5   Total Protein 6.5 - 8.1 g/dL 7.0  5.5  6.6   Total Bilirubin <1.2 mg/dL 0.6  1.0  1.0   Alkaline Phos 38 - 126 U/L 59  67  76   AST 15 - 41 U/L 16  22  31    ALT 0 - 44 U/L 18  26  28       RADIOGRAPHIC STUDIES: I have personally reviewed the radiological images as listed and agreed with the findings in the report. No results found.  ASSESSMENT & PLAN:  82 y.o. female with:  Acute saddle pulmonary embolism with acute cor pulmonale Acute deep vein thrombosis of femoral vein of right lower extremity thrombocytopenia  PLAN: -Discussed lab results from 08/11/2023 in detail with the patient. CBC shows elevated hemoglobin of 15.7 g/dL with hematocrit of 21.3%. CMP stable. LDH in the normal range at 141. Beta-2 IgG/IgM/IgA levels are in the normal range. Anticardiolipin IgG/M/A levels in the normal range. dRRVVT elevated at 45.1 with dRVVT confirm of 1.0.  -Discussed Lupus anticoagulant panel results from 08/11/2023. Showed elevated DRVVT of 55.3. No lupus  anticoagulant was detected. -Discussed with the patient that she does not have any genetic risk factor for DVT. -Educated the patient regarding the risk factors for DVT. Patient does not have one risk factor for DVT. -Discussed the next options: 1. Full-dose blood thinner, Eliquis 5mg  po BID, long-term. 2. Or Preventative dose of blood thinner.  -Patient wants to continue full-dose Eliquis.  -Continue to follow-up with ophthalmologist for Eylea injections. -Next step: 1. Continue full-dose Eliquis. 2. Ultrasound of lower extremities in 8 weeks before our next visit in 10 weeks. -Schedule patient for [place] of Lower Extremities in 8 weeks  FOLLOW-UP: [place] lower extremity venous in 8 weeks RTC with Dr Candise Che with labs in 10 weeks  The total time spent in the appointment was 20 minutes* .  All of the patient's questions were answered with apparent satisfaction. The patient knows to call the clinic with any problems, questions or concerns.   Wyvonnia Lora MD MS AAHIVMS [facility] Hematology/Oncology Physician [facility]  .*Total Encounter Time as defined by the Centers for Medicare and Medicaid Services includes, in addition to the face-to-face time of a patient visit (documented in the note above) non-face-to-face time: obtaining and reviewing outside history, ordering and reviewing medications, tests or procedures, care coordination (communications with other health care professionals or caregivers) and documentation in the medical record.  I,Param Shah,acting as a Neurosurgeon for Wyvonnia Lora, MD.,have documented all relevant documentation on the behalf of Wyvonnia Lora, MD,as directed by  Wyvonnia Lora, MD while in the presence of Wyvonnia Lora, MD.  .I have reviewed the above documentation for accuracy and completeness, and I agree with the above. Johney Maine MD

## 2023-08-29 ENCOUNTER — Telehealth: Payer: Self-pay | Admitting: Hematology

## 2023-08-29 ENCOUNTER — Ambulatory Visit: Payer: Medicare Other | Admitting: Nurse Practitioner

## 2023-08-29 NOTE — Telephone Encounter (Signed)
Spoke with patient confirming upcoming appointment  

## 2023-09-07 ENCOUNTER — Ambulatory Visit: Payer: Medicare Other | Admitting: Primary Care

## 2023-09-12 ENCOUNTER — Other Ambulatory Visit: Payer: Self-pay | Admitting: Internal Medicine

## 2023-09-12 DIAGNOSIS — Z1231 Encounter for screening mammogram for malignant neoplasm of breast: Secondary | ICD-10-CM

## 2023-09-14 ENCOUNTER — Ambulatory Visit (INDEPENDENT_AMBULATORY_CARE_PROVIDER_SITE_OTHER): Payer: Medicare Other

## 2023-09-14 DIAGNOSIS — I2602 Saddle embolus of pulmonary artery with acute cor pulmonale: Secondary | ICD-10-CM | POA: Diagnosis not present

## 2023-09-14 LAB — ECHOCARDIOGRAM COMPLETE
Area-P 1/2: 2.48 cm2
S' Lateral: 2.26 cm

## 2023-09-15 NOTE — Progress Notes (Signed)
 Please let patient know echo showed right ventricle function has improved since prior  study 06/29/2023 (ie. less strain on the heart from blood clot). Continue current treatment until she sees Dr. Judeth Horn in February

## 2023-09-18 ENCOUNTER — Ambulatory Visit
Admission: RE | Admit: 2023-09-18 | Discharge: 2023-09-18 | Disposition: A | Discharge status: Home / Self Care | Payer: Medicare Other | Source: Ambulatory Visit | Attending: Internal Medicine | Admitting: Internal Medicine

## 2023-09-18 DIAGNOSIS — Z1231 Encounter for screening mammogram for malignant neoplasm of breast: Secondary | ICD-10-CM

## 2023-09-19 ENCOUNTER — Other Ambulatory Visit: Payer: Self-pay | Admitting: Internal Medicine

## 2023-09-19 DIAGNOSIS — M810 Age-related osteoporosis without current pathological fracture: Secondary | ICD-10-CM

## 2023-10-09 ENCOUNTER — Ambulatory Visit (INDEPENDENT_AMBULATORY_CARE_PROVIDER_SITE_OTHER): Payer: Medicare Other | Admitting: Pulmonary Disease

## 2023-10-09 ENCOUNTER — Encounter (HOSPITAL_BASED_OUTPATIENT_CLINIC_OR_DEPARTMENT_OTHER): Payer: Self-pay | Admitting: Cardiovascular Disease

## 2023-10-09 ENCOUNTER — Encounter: Payer: Self-pay | Admitting: Pulmonary Disease

## 2023-10-09 ENCOUNTER — Ambulatory Visit: Payer: Medicare Other

## 2023-10-09 VITALS — BP 118/66 | HR 66 | Ht 65.0 in | Wt 146.4 lb

## 2023-10-09 DIAGNOSIS — I2602 Saddle embolus of pulmonary artery with acute cor pulmonale: Secondary | ICD-10-CM | POA: Diagnosis not present

## 2023-10-09 NOTE — Patient Instructions (Signed)
Nice to see you  I am glad your exercise tolerance is improving, slowly increase by 5 minutes or so with swimming etc. and build back up to your prior baseline  Continue Eliquis 1 tablet twice a day  I ordered a perfusion scan and a chest x-ray to be performed to evaluate to see if the clot is totally gone.  If so this would be great news unlikely no further follow-up is needed but we will discuss via MyChart or over the phone once we get results.  Return to clinic as needed

## 2023-10-09 NOTE — Progress Notes (Signed)
@Patient  ID: Julie [name], female    DOB: February 12, 1941, 83 y.o., 83 y.o.   MRN: [id number]  Chief Complaint  Patient presents with   Follow-up    Referring provider: Alysia Penna, MD  HPI:  83 y.o. woman who was seen for follow-up of submassive PE.  Most recent pulmonary note Buelah Manis, NP reviewed.  Most recent hematology note reviewed.  Discharge summary reviewed.  Overall dyspnea markedly improved.  At baseline with some an hour at a time.  Now up to 45 minutes.  Walking a mile or more times.  No real limitations.  Gradually building up strength.  Repeat TTE obtained in interim reveals improvement but still some RV dysfunction.  We discussed role and rationale for additional testing to evaluate for possible chronic blood clot.  Versus chronic RV dysfunction that preceded.  Reviewed on indefinite anticoagulation for now pending results of test.  If no evidence of blood clot then could discuss discontinuing in the future.  However it seems like she is most interested in continuing a blood thinner indefinitely unless complications arise.  If chronic blood clot in the lungs is noted, we discussed right heart cath referral to [facility] for evaluation of CTEPH etc.  She expressed understanding.   HPI at initial clinic visit Buelah Manis NP Hospitalized on 10/24- 10/27 for acute bilateral PE with saddle embolus and CT evidence of right heart strain with severely reduced RV function. She did not require ICU admission for direct thrombolytics. DVT involving right femoral vein, right popliteal popliteal vein, right posterior tibial vein and right peroneal veins. Plan continue anticoagulation for 3 months. Needs echocardiogram in January to follow up on RV dysfunction following submassive PE  She is doing well today, accompanied by her partner. She has no respiratory complaints or oxygen requirements. She is compliant with Eliquis and tolerating blood thinner. Denies shortness of breath symptoms. She was able to  start going to aquatic center again, she tolerated about 15 mins of exercise.   She reports an 8 hour long car ride I may, she stopped every hour on the way down. She gets an injection in her right eye called allea and read that there is a small chance for blood clot with medication use. Next injection with ophthalmology isn't until February. Primary care did blood work and referred to hematology. She has always bruised easily.   Allergies  Allergen Reactions   Losartan Anaphylaxis    Diffuse hives and tongue swelling   Propantheline Rash    Cannot remember other symptoms    Atorvastatin     Myalgia    Gluten Meal Diarrhea    bloating   Rosuvastatin    Sulfa Antibiotics Swelling   Zetia [Ezetimibe]     Myalgias    Demerol [Meperidine] Rash   Morphine And Codeine Rash    Immunization History  Administered Date(s) Administered   Fluad Trivalent(High Dose 65+) 06/13/2023   Tdap 02/01/2016    Past Medical History:  Diagnosis Date   Arthritis    Asthma    CAD in native artery 06/06/2017   Mild LAD disease on coronary CT-A 05/2017.   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 On day 8, switch to one-5mg  tablet twice daily. 74 each 0   cholecalciferol (VITAMIN D) 1000 units tablet Take  1,000 Units by mouth daily.     diphenhydrAMINE (BENADRYL) 25 MG tablet Take 1 tablet (25 mg total) by mouth every 8 (eight) hours as needed for up to 21 doses for allergies or sleep. 21 tablet 0   levothyroxine (SYNTHROID) 125 MCG tablet Take 125 mcg by mouth daily before breakfast.     Multiple Vitamins-Minerals (MULTIVITAMIN WITH MINERALS) tablet Take 1 tablet by mouth daily.     Multiple Vitamins-Minerals (PRESERVISION AREDS 2 PO) Take 1 tablet by mouth daily.     No facility-administered medications prior to visit.   Review of Systems    Physical Exam  BP 118/66 (BP Location: Left Arm, Patient Position: Sitting, Cuff Size: Normal)   Pulse 66   Ht 5\' 5"  (1.651 m)   Wt 146 lb 6.4 oz (66.4 kg)   SpO2 96%   BMI 24.36 kg/m   General: Sitting in chair, no acute distress Eyes: EOMI, no icterus Neck: Supple, no JVP appreciated sitting upright Pulmonary: Distant, clear Cardiovascular: Regular rate and rhythm, no murmur Abdomen: Not thickened vessels present MSK: No synovitis, no joint effusion Neuro: Normal gait, no weakness Psych: Normal mood, full affect   Lab Results: Personally reviewed CBC    Component Value Date/Time   WBC 6.1 08/11/2023 1158   WBC 5.6 07/02/2023 0323   RBC 4.81 08/11/2023 1158   HGB 15.7 (H) 08/11/2023 1158   HCT 46.5 (H) 08/11/2023 1158   PLT 328 08/11/2023 1158   MCV 96.7 08/11/2023 1158   MCH 32.6 08/11/2023 1158   MCHC 33.8 08/11/2023 1158   RDW 12.0 08/11/2023 1158   LYMPHSABS 1.9 08/11/2023 1158   MONOABS 0.4 08/11/2023 1158   EOSABS 0.0 08/11/2023 1158   BASOSABS 0.0 08/11/2023 1158    BMET    Component Value Date/Time   NA 139 08/11/2023 1158   NA 142 12/29/2021 0802   K 4.1 08/11/2023 1158   CL 103 08/11/2023 1158   CO2 29 08/11/2023 1158   GLUCOSE 82 08/11/2023 1158   BUN 12 08/11/2023 1158   BUN 12 12/29/2021 0802   CREATININE 0.73 08/11/2023 1158   CALCIUM 9.8 08/11/2023 1158   GFRNONAA >60 08/11/2023 1158   GFRAA >60  08/05/2019 0953    BNP    Component Value Date/Time   BNP 81.9 06/29/2023 1148    ProBNP No results found for: "PROBNP"  Imaging: Personally reviewed MM 3D SCREENING MAMMOGRAM BILATERAL BREAST Result Date: 09/19/2023 CLINICAL DATA:  Screening. EXAM: DIGITAL SCREENING BILATERAL MAMMOGRAM WITH TOMOSYNTHESIS AND CAD TECHNIQUE: Bilateral screening digital craniocaudal and mediolateral oblique mammograms were obtained. Bilateral screening digital breast tomosynthesis was performed. The images were evaluated with computer-aided detection. COMPARISON:  Previous exam(s). ACR Breast Density Category b: There are scattered areas of fibroglandular density. FINDINGS: There are no findings suspicious for malignancy. IMPRESSION: No mammographic evidence of malignancy. A result letter of this screening mammogram will be  mailed directly to the patient. RECOMMENDATION: Screening mammogram in one year. (Code:SM-B-01Y) BI-RADS CATEGORY  1: Negative. Electronically Signed   By: Ted Mcalpine M.D.   On: 09/19/2023 12:33   ECHOCARDIOGRAM COMPLETE Result Date: 09/14/2023    ECHOCARDIOGRAM REPORT   Patient Name:   Julie [name] Date of Exam: 09/14/2023 Medical Rec #:  [id number]    Height:       65.0 in Accession #:    [id number]   Weight:       146.6 lb Date of Birth:  11/23/1940    BSA:          1.733 m Patient Age:    82 years     BP:           185/93 mmHg Patient Gender: F            HR:           61 bpm. Exam Location:  Outpatient Procedure: 2D Echo, 3D Echo, Cardiac Doppler, Color Doppler and Strain Analysis Indications:    Pulmonary Embolus  History:        Patient has prior history of Echocardiogram examinations, most                 recent 06/29/2023. CAD, TIA; Risk Factors:Non-Smoker and                 Dyslipidemia. Acute Pulmonary Embolism.  Sonographer:    Jeryl Columbia RDCS Referring Phys: [id number] Earnstine Regal [facility] IMPRESSIONS  1. Left ventricular ejection fraction, by estimation, is 55 to 60%. The left  ventricle has normal function. The left ventricle has no regional wall motion abnormalities. There is mild concentric left ventricular hypertrophy. Left ventricular diastolic parameters are consistent with Grade I diastolic dysfunction (impaired relaxation).  2. Right ventricular systolic function is mildly reduced. The right ventricular size is normal. There is normal pulmonary artery systolic pressure.  3. Left atrial size was mildly dilated.  4. No evidence of mitral valve regurgitation.  5. The aortic valve is tricuspid. Aortic valve regurgitation is not visualized.  6. The inferior vena cava is normal in size with greater than 50% respiratory variability, suggesting right atrial pressure of 3 mmHg. 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 FINDINGS  Left Ventricle: Left ventricular ejection fraction, by estimation, is 55 to 60%. The left ventricle has normal function. The left ventricle has no regional wall motion abnormalities. The left ventricular internal cavity size was normal in size. There is  mild concentric left ventricular hypertrophy. Left ventricular diastolic parameters are consistent with Grade I diastolic dysfunction (impaired relaxation). Right Ventricle: The right ventricular size is normal. Right ventricular systolic function is mildly reduced. There is normal pulmonary artery systolic pressure. The tricuspid regurgitant velocity is 1.99 m/s, and with an assumed right atrial pressure of  3 mmHg, the estimated right ventricular systolic pressure is 18.8 mmHg. Left Atrium: Left atrial size was mildly dilated. Right Atrium: Right atrial size was normal in size. Pericardium: There is no evidence of pericardial effusion. Mitral Valve: No evidence of mitral valve regurgitation. Tricuspid Valve: Tricuspid valve regurgitation is trivial. Aortic Valve: The aortic valve is tricuspid. Aortic valve regurgitation is  not visualized. Pulmonic Valve: Pulmonic valve regurgitation is not visualized. Aorta: The aortic root and ascending aorta are structurally normal, with no evidence of dilitation. Venous: The inferior vena cava is normal in size with greater than 50% respiratory variability, suggesting right atrial  pressure of 3 mmHg. IAS/Shunts: No atrial level shunt detected by color flow Doppler.  LEFT VENTRICLE PLAX 2D LVIDd:         3.76 cm   Diastology LVIDs:         2.26 cm   LV e' medial:    4.24 cm/s LV PW:         1.30 cm   LV E/e' medial:  8.0 LV IVS:        1.22 cm   LV e' lateral:   5.33 cm/s LVOT diam:     1.90 cm   LV E/e' lateral: 6.3 LV SV:         40 LV SV Index:   23 LVOT Area:     2.84 cm                           3D Volume EF:                          3D EF:        61 %                          LV EDV:       104 ml                          LV ESV:       41 ml                          LV SV:        63 ml RIGHT VENTRICLE RV Basal diam:  3.78 cm RV Mid diam:    3.27 cm RV S prime:     8.49 cm/s TAPSE (M-mode): 2.1 cm LEFT ATRIUM           Index        RIGHT ATRIUM           Index LA diam:      4.40 cm 2.54 cm/m   RA Area:     10.10 cm LA Vol (A2C): 52.1 ml 30.05 ml/m  RA Volume:   22.10 ml  12.75 ml/m  AORTIC VALVE LVOT Vmax:   58.80 cm/s LVOT Vmean:  38.200 cm/s LVOT VTI:    0.142 m  AORTA Ao Root diam: 3.20 cm Ao Asc diam:  3.50 cm MITRAL VALVE               TRICUSPID VALVE MV Area (PHT): 2.48 cm    TR Peak grad:   15.8 mmHg MV Decel Time: 306 msec    TR Vmax:        199.00 cm/s MV E velocity: 33.80 cm/s MV A velocity: 54.40 cm/s  SHUNTS MV E/A ratio:  0.62        Systemic VTI:  0.14 m                            Systemic Diam: 1.90 cm Carolan Clines Electronically signed by Carolan Clines Signature Date/Time: 09/14/2023/11:43:13 AM    Final      Assessment & Plan:   Submassive pulmonary embolism: Possibly provoked given car travel to [place] in preceding weeks.  TTE with RV dysfunction.  After 3 months of  anticoagulation  TTE demonstrates persistent RV dysfunction although improved over time.  Symptomatically markedly improved.  Unclear if RV dysfunction is chronic.  Prior echo in 2017 with normal RV size and function etc.  VQ scan for further evaluation of development of chronic clot.  Continue anticoagulation.  Have discussed continuing anticoagulation indefinitely regardless.  Discussed possible workup for CTEPH in the future if chronic clot, chronic PE were discovered on VQ scan.  Dyspnea on exertion: Related to acute PE.  Markedly improved, encouraging for resolution of clot..  Improved over time.  Encouraged ongoing gradual increase in exertion.  Not quite back to baseline 60 minutes of sweating, at 45 minutes.  Encouraging increasing by 5-minute intervals.  She feels like she cannot and would like to continue to push through given marked improvement in symptoms over time.  If repeat imaging, VQ scan clear, return to clinic as needed, if abnormal can discuss follow-up plan via MyChart or phone   Karren Burly, MD 10/09/2023  I spent 40 minutes in the care of the patient including review of records, face-to-face visit, coordination of care.

## 2023-10-17 ENCOUNTER — Ambulatory Visit (HOSPITAL_COMMUNITY)
Admission: RE | Admit: 2023-10-17 | Discharge: 2023-10-17 | Disposition: A | Discharge status: Home / Self Care | Payer: Medicare Other | Source: Ambulatory Visit | Attending: Pulmonary Disease | Admitting: Pulmonary Disease

## 2023-10-17 ENCOUNTER — Ambulatory Visit (HOSPITAL_COMMUNITY)
Admission: RE | Admit: 2023-10-17 | Discharge: 2023-10-17 | Discharge status: Home / Self Care | Payer: Medicare Other | Source: Ambulatory Visit | Attending: Pulmonary Disease | Admitting: Pulmonary Disease

## 2023-10-17 DIAGNOSIS — I2602 Saddle embolus of pulmonary artery with acute cor pulmonale: Secondary | ICD-10-CM | POA: Diagnosis present

## 2023-10-17 MED ORDER — TECHNETIUM TO 99M ALBUMIN AGGREGATED
4.2000 | Freq: Once | INTRAVENOUS | Status: AC | PRN
  Administered 2023-10-17: 4.2 via INTRAVENOUS

## 2023-10-19 ENCOUNTER — Ambulatory Visit (INDEPENDENT_AMBULATORY_CARE_PROVIDER_SITE_OTHER): Payer: Medicare Other

## 2023-10-19 DIAGNOSIS — I2602 Saddle embolus of pulmonary artery with acute cor pulmonale: Secondary | ICD-10-CM | POA: Diagnosis not present

## 2023-10-19 DIAGNOSIS — I82411 Acute embolism and thrombosis of right femoral vein: Secondary | ICD-10-CM | POA: Diagnosis not present

## 2023-10-24 ENCOUNTER — Encounter: Payer: Self-pay | Admitting: Pulmonary Disease

## 2023-10-27 NOTE — Progress Notes (Signed)
 [name] [address] [place] [address] Phone: [phone] Subjective:   [name], am serving as a scribe for Dr. Antoine Primas.  I'm seeing this patient by the request  of:  Alysia Penna, MD  CC: Bilateral knee and SI joint pain  BJY:[id number]  08/08/2023 Right second injection today.  Tolerated the procedure well, discussed with patient about monitoring for any swelling I think patient is doing fine given that she is on Eliquis.  I did warn patient that if this does get diagnosed as an unprovoked blood clot there is a possibility Eliquis will be needed lifelong.  Otherwise there is a chance that it will be only 3 to 6 months.  Patient is going to talk to her ophthalmologist on a more regular basis as well and we will see if we can find any other information so she can make a educated decision if she is going to continue the injections.     Chronic problem with another exacerbation.  Due to patient being on a blood thinner decreases the likelihood of different treatment options.  Discussed with patient about icing regimen and home exercises, increase activity slowly otherwise.  Follow-up with me again in 6 to 8 weeks     Update 11/07/2023 Julie [name] is a 83 y.o. female coming in with complaint of B knee and SI joint pain. Patient states no big changes with the pain. Long walks R side grabs short walks are okay.   Patient has been seen by pulmonary after her saddle embolism.  Patient was to continue the anticoagulation secondary to TEE TEE still showing some right ventricular strain.  Repeat imaging though did show that there was no sign of a blood clot.  Past Medical History:  Diagnosis Date   Arthritis    Asthma    CAD in native artery 06/06/2017   Mild LAD disease on coronary CT-A 05/2017.   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   Procedure: RIGHT TOTAL HIP ARTHROPLASTY ANTERIOR APPROACH;  Surgeon: Marcene Corning, MD;  Location: [facility];  Service: Orthopedics;  Laterality: Right;   TOTAL THYROIDECTOMY     Social History   Socioeconomic History   Marital status: Media planner    Spouse name: Okey Regal   Number of children: Not on file   Years of education: Not on file   Highest education level: Master's degree (e.g., MA, MS, MEng, MEd, MSW, MBA)  Occupational History   Not on file  Tobacco Use   Smoking status: Never   Smokeless tobacco: Never  Vaping Use   Vaping status: Never Used  Substance and Sexual Activity   Alcohol use: Yes    Comment: 12/02/21 3  daily   Drug use: No   Sexual activity: Not Currently  Other Topics Concern   Not on file  Social History Narrative   Lives with partner   Social Drivers of Health   Financial Resource Strain: Not on file  Food Insecurity: No Food Insecurity (06/29/2023)   Hunger Vital Sign    Worried About Running Out of Food in the Last Year: Never true    Ran Out of Food in the Last Year: Never true  Transportation Needs: No Transportation Needs (06/29/2023)   PRAPARE - Administrator, Civil Service (Medical): No    Lack of Transportation (Non-Medical): No  Physical Activity: Not on file  Stress: Not on file  Social Connections: Not on file   Allergies  Allergen Reactions   Losartan Anaphylaxis    Diffuse hives and tongue swelling    Propantheline Rash    Cannot remember other symptoms    Atorvastatin     Myalgia    Gluten Meal Diarrhea    bloating   Rosuvastatin    Sulfa Antibiotics Swelling   Zetia [Ezetimibe]     Myalgias    Demerol [Meperidine] Rash   Morphine And Codeine Rash   Family History  Problem Relation Age of Onset   Peripheral Artery Disease Mother    Dementia Father     Current Outpatient Medications (Endocrine & Metabolic):    levothyroxine (SYNTHROID) 125 MCG tablet, Take 125 mcg by mouth daily before breakfast.   Current Outpatient Medications (Respiratory):    diphenhydrAMINE (BENADRYL) 25 MG tablet, Take 1 tablet (25 mg total) by mouth every 8 (eight) hours as needed for up to 21 doses for allergies or sleep.   Current Outpatient Medications (Hematological):    APIXABAN (ELIQUIS) VTE STARTER PACK (10MG  AND 5MG ), Take as directed on package: start with two-5mg  tablets twice daily for 7 days. On day 8, switch to one-5mg  tablet twice daily.  Current Outpatient Medications (Other):    cholecalciferol (VITAMIN D) 1000 units tablet, Take 1,000 Units by mouth daily.   Multiple Vitamins-Minerals (MULTIVITAMIN WITH MINERALS) tablet, Take 1 tablet by mouth daily.   Multiple Vitamins-Minerals (PRESERVISION AREDS 2 PO), Take 1 tablet by mouth daily.   Reviewed prior external information including notes and imaging from  primary care provider As well as notes that were available from care everywhere and other healthcare systems.  Past medical history, social, surgical and family history all reviewed in electronic medical record.  No pertanent information unless stated regarding to the chief complaint.   Review of Systems:  No headache, visual changes, nausea, vomiting, diarrhea, constipation, dizziness, abdominal pain, skin rash, fevers, chills, night sweats, weight loss, swollen lymph nodes, body aches, joint swelling, chest pain, shortness of breath, mood changes. POSITIVE muscle  aches  Objective  Blood pressure 122/76, pulse 60, height 5\' 5"  (1.651 m), weight 146 lb (66.2 kg), SpO2 96%.   General: No apparent distress alert and oriented x3 mood and affect normal, dressed appropriately.  HEENT: Pupils equal, extraocular movements intact  Respiratory: Patient's speak in full sentences and does not appear short of breath  Cardiovascular: No lower extremity edema, non tender, no erythema  Low back exam shows shows patient does have some loss of lordosis.  Severe tenderness over the right sacroiliac joint noted.  Knee exam shows crepitus noted of the knees.  Worsening on the right side of the knee.  Instability with valgus and varus force   After informed written and verbal consent,  patient was seated on exam table. Right knee was prepped with alcohol swab and utilizing anterolateral approach, patient's right knee space was injected with 4:1  marcaine 0.5%: Kenalog 40mg /dL. Patient tolerated the procedure well without immediate complications.  After verbal consent patient was prepped with alcohol swab and with a 21-gauge 2 inch needle injected into the right sacroiliac joint with a total of 0.5 cc of 0.5% Marcaine and 0.5 cc of Kenalog 40 mg/mL.  Minimal blood loss.  Band-Aid placed.  Postinjection instructions were given.   Impression and Recommendations:    The above documentation has been reviewed and is accurate and complete Judi Saa, DO

## 2023-11-01 ENCOUNTER — Ambulatory Visit: Payer: Medicare Other | Admitting: Pulmonary Disease

## 2023-11-07 ENCOUNTER — Ambulatory Visit: Payer: Medicare Other | Admitting: Family Medicine

## 2023-11-07 ENCOUNTER — Encounter: Payer: Self-pay | Admitting: Family Medicine

## 2023-11-07 VITALS — BP 122/76 | HR 60 | Ht 65.0 in | Wt 146.0 lb

## 2023-11-07 DIAGNOSIS — M461 Sacroiliitis, not elsewhere classified: Secondary | ICD-10-CM | POA: Diagnosis not present

## 2023-11-07 DIAGNOSIS — M170 Bilateral primary osteoarthritis of knee: Secondary | ICD-10-CM

## 2023-11-07 NOTE — Patient Instructions (Signed)
 Injection in knee and SI See you again in 10-12 weeks

## 2023-11-10 ENCOUNTER — Ambulatory Visit: Payer: Medicare Other | Admitting: Hematology

## 2023-11-10 ENCOUNTER — Other Ambulatory Visit: Payer: Medicare Other

## 2023-11-13 ENCOUNTER — Other Ambulatory Visit: Payer: Self-pay

## 2023-11-13 DIAGNOSIS — I2602 Saddle embolus of pulmonary artery with acute cor pulmonale: Secondary | ICD-10-CM

## 2023-11-14 ENCOUNTER — Inpatient Hospital Stay (HOSPITAL_BASED_OUTPATIENT_CLINIC_OR_DEPARTMENT_OTHER): Payer: Medicare Other | Admitting: Hematology

## 2023-11-14 ENCOUNTER — Inpatient Hospital Stay: Payer: Medicare Other | Attending: Hematology

## 2023-11-14 VITALS — BP 135/59 | HR 55 | Temp 97.7°F | Resp 16 | Ht 65.0 in | Wt 145.4 lb

## 2023-11-14 DIAGNOSIS — Z7901 Long term (current) use of anticoagulants: Secondary | ICD-10-CM | POA: Insufficient documentation

## 2023-11-14 DIAGNOSIS — D751 Secondary polycythemia: Secondary | ICD-10-CM

## 2023-11-14 DIAGNOSIS — D696 Thrombocytopenia, unspecified: Secondary | ICD-10-CM | POA: Diagnosis not present

## 2023-11-14 DIAGNOSIS — I2602 Saddle embolus of pulmonary artery with acute cor pulmonale: Secondary | ICD-10-CM | POA: Diagnosis present

## 2023-11-14 DIAGNOSIS — Z86718 Personal history of other venous thrombosis and embolism: Secondary | ICD-10-CM | POA: Diagnosis not present

## 2023-11-14 DIAGNOSIS — M25461 Effusion, right knee: Secondary | ICD-10-CM | POA: Diagnosis not present

## 2023-11-14 DIAGNOSIS — I82411 Acute embolism and thrombosis of right femoral vein: Secondary | ICD-10-CM

## 2023-11-14 DIAGNOSIS — Z85828 Personal history of other malignant neoplasm of skin: Secondary | ICD-10-CM | POA: Insufficient documentation

## 2023-11-14 DIAGNOSIS — Z8673 Personal history of transient ischemic attack (TIA), and cerebral infarction without residual deficits: Secondary | ICD-10-CM | POA: Insufficient documentation

## 2023-11-14 LAB — CBC WITH DIFFERENTIAL (CANCER CENTER ONLY)
Abs Immature Granulocytes: 0.01 10*3/uL (ref 0.00–0.07)
Basophils Absolute: 0.1 10*3/uL (ref 0.0–0.1)
Basophils Relative: 1 %
Eosinophils Absolute: 0.2 10*3/uL (ref 0.0–0.5)
Eosinophils Relative: 3 %
HCT: 46.9 % — ABNORMAL HIGH (ref 36.0–46.0)
Hemoglobin: 15.8 g/dL — ABNORMAL HIGH (ref 12.0–15.0)
Immature Granulocytes: 0 %
Lymphocytes Relative: 29 %
Lymphs Abs: 2.1 10*3/uL (ref 0.7–4.0)
MCH: 31.3 pg (ref 26.0–34.0)
MCHC: 33.7 g/dL (ref 30.0–36.0)
MCV: 92.9 fL (ref 80.0–100.0)
Monocytes Absolute: 0.5 10*3/uL (ref 0.1–1.0)
Monocytes Relative: 7 %
Neutro Abs: 4.3 10*3/uL (ref 1.7–7.7)
Neutrophils Relative %: 60 %
Platelet Count: 271 10*3/uL (ref 150–400)
RBC: 5.05 MIL/uL (ref 3.87–5.11)
RDW: 12.6 % (ref 11.5–15.5)
WBC Count: 7.1 10*3/uL (ref 4.0–10.5)
nRBC: 0 % (ref 0.0–0.2)

## 2023-11-14 LAB — CMP (CANCER CENTER ONLY)
ALT: 14 U/L (ref 0–44)
AST: 14 U/L — ABNORMAL LOW (ref 15–41)
Albumin: 4 g/dL (ref 3.5–5.0)
Alkaline Phosphatase: 66 U/L (ref 38–126)
Anion gap: 6 (ref 5–15)
BUN: 15 mg/dL (ref 8–23)
CO2: 31 mmol/L (ref 22–32)
Calcium: 9.3 mg/dL (ref 8.9–10.3)
Chloride: 106 mmol/L (ref 98–111)
Creatinine: 0.76 mg/dL (ref 0.44–1.00)
GFR, Estimated: >60 mL/min (ref >60)
Glucose, Bld: 88 mg/dL (ref 70–99)
Potassium: 4.4 mmol/L (ref 3.5–5.1)
Sodium: 143 mmol/L (ref 135–145)
Total Bilirubin: 0.9 mg/dL (ref 0.0–1.2)
Total Protein: 6.4 g/dL — ABNORMAL LOW (ref 6.5–8.1)

## 2023-11-14 LAB — LACTATE DEHYDROGENASE: LDH: 126 U/L (ref 98–192)

## 2023-11-14 NOTE — Progress Notes (Signed)
 HEMATOLOGY/ONCOLOGY CLINIC NOTE  Date of Service: 11/14/2023  Patient Care Team: Alysia Penna, MD as PCP - General (Internal Medicine) Chilton Si, MD as PCP - Cardiology (Cardiology) Duke, Roe Rutherford, PA as Physician Assistant (Cardiology)  CHIEF COMPLAINTS/PURPOSE OF CONSULTATION:  Evaluation and management of acute saddle pulmonary embolism with acute cor pulmonale, acute deep vein thrombosis of femoral vein of right lower extremity, and thrombocytopenia.   HISTORY OF PRESENTING ILLNESS:   Julie [name] is a wonderful 83 y.o. female who has been referred to [place] by Jarome Lamas, AG-NP for evaluation and management of acute saddle pulmonary embolism with acute cor pulmonale, acute deep vein thrombosis of femoral vein of right lower extremity, and thrombocytopenia.  Today, she is accompanied by her significant other. Patient reports no history of blood clots in past prior to recent findings. She denies any fhx of blood clots in the past. She reports that her hgb has not been high prior to recently.   Patient reports that she endorsed SOB for the first time when she presented to the ER. She reports experiencing sudden SOB when walking up a hill which she had climbed previously for years with no issues. Patient reports that she is quite active in general and does go camping frequently.   She reports engaging in a long car ride of 4.5 hours prior to the event and notes that she took frequent rest stops every 2 hours.   She is currently Eliquis 5MG  twice a day at this time. Patient reports that she recently started using compression socks.   Patient reports that she received hip replacement surgery 4 years ago which did not trigger a blood clot. She has never been pregnant or had children. Patient has never used any hormonal birth control pills.   Patient reports previous travel arrangements in May of this year involving a train ride for 30 hours and riding a [place],  and notes no issues. She reports that she moved around frequently during the train ride and her bus did make frequent stops.   Patient reports that she did have a COVID-19 infection in May after her bus ride. Her covid infection was fairly mild and she had no new leg symptoms or SOB at that time.   Patient reports that her right knee cap is out of position by about 30%, and notes that this issue has been present for a while. She complains of constant swelling in her right knee. She wears a knee brace with certain activities such as playing golf. Patient reports one event in September, in which her knee was more swollen than usual while playing golf, about 6 weeks prior to her blood clot. She did not engage in any new/different activity and the area was not painful. She had no SOB or chest pain at that time. Patient iced the area to manage symptoms.   Patient denies any sudden weight loss, fever, chills, drenching night sweats, new lumps/bumps, change in bowel habits, or change in urinary habits. She denies any issues of varicose veins.   She reports that there have been no concerns on her previous  cancer screenings and generally stays UTD with her age-appropriate cancer screenings.   Patient does have a history of TIA in 2017. She does endorse celiac disease and reports that she regularly avoids gluten-containing foods. Patient does endorse hypothyroidism and notes that her thyroid was removed in the 1980s due to having a nodule, which was not noted to be cancerous.  She has since been started on Synthroid.   Patient is a never smoker. She reports that she generally does not drink adequate levels of water. She reports that she is not on a diuretic at this time.   Patient reports endorsing diarrhea periodically. 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   Patient notes some abdominal tenderness on palpation, but denies any abdominal pain otherwise.   She reports that her mother passed away from  hardening of the arteries.   Patient does have a history of skin cancer and denies any other cancers or other fhx of cancer.   Patient reports that she endorses macular degeneration and received Eylea injections on four occasions. She reports that she did have bleeding inside the eye on two occasions from [facility] injections. Patient reports that she is currently due for shots in both of her eyes in February.   She reports that she has an echocardiogram scheduled for January 9th, 2025, ordered by her pulmonologist, Dr. Judeth Horn.   She denies an upcoming long-distance travel plans for the holidays. Patient is planning to travel to [place] in February.   INTERVAL HISTORY:  Julie [name] is a wonderful 83 y.o. 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   I last connected with the patient on 08/28/2023, via tele-med visit, and she complained of bilateral leg swelling and mild SOB upon exertion.   Patient is accompanied by her family member during this visit. Patient notes she has been doing well overall since our last visit. She denies any new infection issues, fatigue, SOB, fever, chills, night sweats, unexpected weight loss, back pain, chest pain, abdominal pain, or leg swelling. She does report left knee swelling, which she attributes it to arthritis.   MEDICAL HISTORY:  Past Medical History:  Diagnosis Date   Arthritis    Asthma    CAD in native artery 06/06/2017   Mild LAD disease on coronary CT-A 05/2017.   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   Thyroid  disease    TIA (transient ischemic attack)    2017   Transaminitis 09/15/2020    SURGICAL HISTORY: Past Surgical History:  Procedure Laterality Date   ABDOMINAL HYSTERECTOMY     BLADDER SURGERY     hole in bladder from hysterectomy   BREAST SURGERY     biopsy breast benign   EYE SURGERY     bil cataract   TOTAL HIP ARTHROPLASTY Right 08/06/2019   Procedure: RIGHT TOTAL HIP ARTHROPLASTY ANTERIOR APPROACH;  Surgeon: Marcene Corning, MD;  Location: [facility];  Service: Orthopedics;  Laterality: Right;   TOTAL THYROIDECTOMY      SOCIAL HISTORY: Social History   Socioeconomic History   Marital status: Media planner    Spouse name: Okey Regal   Number of children: Not on file   Years of education: Not on file   Highest education level: Master's degree (e.g., MA, MS, MEng, MEd, MSW, MBA)  Occupational History   Not on file  Tobacco Use   Smoking status: Never  Smokeless tobacco: Never  Vaping Use   Vaping status: Never Used  Substance and Sexual Activity   Alcohol use: Yes    Comment: 12/02/21 3 daily   Drug use: No   Sexual activity: Not Currently  Other Topics Concern   Not on file  Social History Narrative   Lives with partner   Social Drivers of Health   Financial Resource Strain: Not on file  Food Insecurity: No Food Insecurity (06/29/2023)   Hunger Vital Sign    Worried About Running Out of Food in the Last Year: Never true    Ran Out of Food in the Last Year: Never true  Transportation Needs: No Transportation Needs (06/29/2023)   PRAPARE - Administrator, Civil Service (Medical): No    Lack of Transportation (Non-Medical): No  Physical Activity: Not on file  Stress: Not on file  Social Connections: Not on file  Intimate Partner Violence: Not At Risk (06/29/2023)   Humiliation, Afraid, Rape, and Kick questionnaire    Fear of Current or Ex-Partner: No    Emotionally Abused: No    Physically Abused: No    Sexually Abused: No    FAMILY  HISTORY: Family History  Problem Relation Age of Onset   Peripheral Artery Disease Mother    Dementia Father     ALLERGIES:  is allergic to losartan, propantheline, atorvastatin, gluten meal, rosuvastatin, sulfa antibiotics, zetia [ezetimibe], demerol [meperidine], and morphine and codeine.  MEDICATIONS:  Current Outpatient Medications  Medication Sig Dispense Refill   APIXABAN (ELIQUIS) VTE STARTER PACK (10MG  AND 5MG ) Take as directed on package: start with two-5mg  tablets twice daily for 7 days. On day 8, switch to one-5mg  tablet twice daily. 74 each 0   cholecalciferol (VITAMIN D) 1000 units tablet Take 1,000 Units by mouth daily.     diphenhydrAMINE (BENADRYL) 25 MG tablet Take 1 tablet (25 mg total) by mouth every 8 (eight) hours as needed for up to 21 doses for allergies or sleep. 21 tablet 0   levothyroxine (SYNTHROID) 125 MCG tablet Take 125 mcg by mouth daily before breakfast.     Multiple Vitamins-Minerals (MULTIVITAMIN WITH MINERALS) tablet Take 1 tablet by mouth daily.     Multiple Vitamins-Minerals (PRESERVISION AREDS 2 PO) Take 1 tablet by mouth daily.     No current facility-administered medications for this visit.    REVIEW OF SYSTEMS:    10 Point review of Systems was done is negative except as noted above.  PHYSICAL EXAMINATION:  .BP (!) 135/59 (BP Location: Left Arm, Patient Position: Sitting) Comment: Nurse notified  Pulse (!) 55 Comment: Nurse notified  Temp 97.7 F (36.5 C) (Temporal)   Resp 16   Ht 5\' 5"  (1.651 m)   Wt 145 lb 6.4 oz (66 kg)   SpO2 97%   BMI 24.20 kg/m  . GENERAL:alert, in no acute distress and comfortable SKIN: no acute rashes, no significant lesions EYES: conjunctiva are pink and non-injected, sclera anicteric OROPHARYNX: MMM, no exudates, no oropharyngeal erythema or ulceration NECK: supple, no JVD LYMPH:  no palpable lymphadenopathy in the cervical, axillary or inguinal regions LUNGS: clear to auscultation b/l with normal  respiratory effort HEART: regular rate & rhythm ABDOMEN:  normoactive bowel sounds , non tender, not distended. Extremity: no pedal edema PSYCH: alert & oriented x 3 with fluent speech NEURO: no focal motor/sensory deficits   LABORATORY DATA:  I have reviewed the data as listed  .    Latest Ref Rng & Units  11/14/2023   10:38 AM 08/11/2023   11:58 AM 07/02/2023    3:23 AM  CBC  WBC 4.0 - 10.5 K/uL 7.1  6.1  5.6   Hemoglobin 12.0 - 15.0 g/dL 69.4  85.4  62.7   Hematocrit 36.0 - 46.0 % 46.9  46.5  38.9   Platelets 150 - 400 K/uL 271  328  149     .    Latest Ref Rng & Units 11/14/2023   10:38 AM 08/11/2023   11:58 AM 06/30/2023    6:40 AM  CMP  Glucose 70 - 99 mg/dL 88  82  035   BUN 8 - 23 mg/dL 15  12  9    Creatinine 0.44 - 1.00 mg/dL 0.09  3.81  8.29   Sodium 135 - 145 mmol/L 143  139  137   Potassium 3.5 - 5.1 mmol/L 4.4  4.1  4.2   Chloride 98 - 111 mmol/L 106  103  102   CO2 22 - 32 mmol/L 31  29  25    Calcium 8.9 - 10.3 mg/dL 9.3  9.8  8.2   Total Protein 6.5 - 8.1 g/dL 6.4  7.0  5.5   Total Bilirubin 0.0 - 1.2 mg/dL 0.9  0.6  1.0   Alkaline Phos 38 - 126 U/L 66  59  67   AST 15 - 41 U/L 14  16  22    ALT 0 - 44 U/L 14  18  26       RADIOGRAPHIC STUDIES: I have personally reviewed the radiological images as listed and agreed with the findings in the report. DG Chest 2 View Result Date: 10/27/2023 CLINICAL DATA:  for perfusion scan EXAM: CHEST - 2 VIEW COMPARISON:  06/29/2019. FINDINGS: The heart size and mediastinal contours are within normal limits. Both lungs are clear. No pneumothorax or pleural effusion. Aorta is calcified and tortuous. There are thoracic degenerative changes. IMPRESSION: No acute cardiopulmonary disease. Electronically Signed   By: Layla Maw M.D.   On: 10/27/2023 21:24   NM Pulmonary Perfusion Result Date: 10/24/2023 CLINICAL DATA:  Concern for pulmonary embolism. Concern for chronic clot. History of deep venous thrombosis. EXAM: NUCLEAR  MEDICINE PERFUSION LUNG SCAN TECHNIQUE: Perfusion images were obtained in multiple projections after intravenous injection of radiopharmaceutical. RADIOPHARMACEUTICALS:  4.2 mCi Tc-79m MAA COMPARISON:  Chest radiograph 10/17/2023 FINDINGS: No wedge-shaped peripheral perfusion defect within LEFT or RIGHT lung to suggest acute pulmonary embolism. Normal perfusion pattern. IMPRESSION: No evidence of acute or chronic pulmonary embolism. Electronically Signed   By: Genevive Bi M.D.   On: 10/24/2023 16:55   VAS [place] LOWER EXTREMITY VENOUS (DVT) Result Date: 10/19/2023  Lower Venous DVT Study Patient Name:  DAKOTA STANGL  Date of Exam:   10/19/2023 Medical Rec #: [id number]     Accession #:    [id number] Date of Birth: 20-Jun-1941     Patient Gender: F Patient Age:   23 years Exam Location:  [facility] Procedure:      VAS [place] LOWER EXTREMITY VENOUS (DVT) Referring Phys: Wyvonnia Lora --------------------------------------------------------------------------------  Indications: Pulmonary embolism. Other Indications: Patient denies any shortness of breath or chest pain. Patient                    had pulmonary embolism 06/2023. Anticoagulation: Eliquis. Comparison Study: 06/30/2023 Age indeterminate deep vein thrombosis involving                   the right femoral vein, right popliteal,  posterior tibial and                   peroneal veins. Performing Technologist: Jeryl Columbia RDCS  Examination Guidelines: A complete evaluation includes B-mode imaging, spectral Doppler, color Doppler, and power Doppler as needed of all accessible portions of each vessel. Bilateral testing is considered an integral part of a complete examination. Limited examinations for reoccurring indications may be performed as noted. The reflux portion of the exam is performed with the patient in reverse Trendelenburg.  +---------+---------------+---------+-----------+----------+--------------+ RIGHT     CompressibilityPhasicitySpontaneityPropertiesThrombus Aging +---------+---------------+---------+-----------+----------+--------------+ CFV      Full           Yes      Yes                                 +---------+---------------+---------+-----------+----------+--------------+ SFJ      Full           Yes      Yes                                 +---------+---------------+---------+-----------+----------+--------------+ FV Prox  Full           Yes      Yes                                 +---------+---------------+---------+-----------+----------+--------------+ FV Mid   Full           Yes      Yes                                 +---------+---------------+---------+-----------+----------+--------------+ FV DistalFull           Yes      Yes                                 +---------+---------------+---------+-----------+----------+--------------+ PFV      Full                                                        +---------+---------------+---------+-----------+----------+--------------+ POP      Full           Yes      Yes                                 +---------+---------------+---------+-----------+----------+--------------+ PTV      Full           Yes      Yes                                 +---------+---------------+---------+-----------+----------+--------------+ PERO     Full           Yes      Yes                                 +---------+---------------+---------+-----------+----------+--------------+  Gastroc  Full                                                        +---------+---------------+---------+-----------+----------+--------------+ GSV      Full           Yes      Yes                                 +---------+---------------+---------+-----------+----------+--------------+   +---------+---------------+---------+-----------+----------+--------------+ LEFT      CompressibilityPhasicitySpontaneityPropertiesThrombus Aging +---------+---------------+---------+-----------+----------+--------------+ CFV      Full           Yes      Yes                                 +---------+---------------+---------+-----------+----------+--------------+ SFJ      Full           Yes      Yes                                 +---------+---------------+---------+-----------+----------+--------------+ FV Prox  Full           Yes      Yes                                 +---------+---------------+---------+-----------+----------+--------------+ FV Mid   Full           Yes      Yes                                 +---------+---------------+---------+-----------+----------+--------------+ FV DistalFull           Yes      Yes                                 +---------+---------------+---------+-----------+----------+--------------+ PFV      Full                                                        +---------+---------------+---------+-----------+----------+--------------+ POP      Full           Yes      Yes                                 +---------+---------------+---------+-----------+----------+--------------+ PTV      Full           Yes      Yes                                 +---------+---------------+---------+-----------+----------+--------------+ PERO     Full           Yes  Yes                                 +---------+---------------+---------+-----------+----------+--------------+ Gastroc  Full           Yes      Yes                                 +---------+---------------+---------+-----------+----------+--------------+ GSV      Full           Yes      Yes                                 +---------+---------------+---------+-----------+----------+--------------+     Summary: BILATERAL: - No evidence of deep vein thrombosis seen in the lower extremities, bilaterally. -No evidence of popliteal  cyst, bilaterally.   *See table(s) above for measurements and observations. Electronically signed by Heath Lark on 10/19/2023 at 5:56:00 PM.    Final     ASSESSMENT & PLAN:  83 y.o. female with:  Acute saddle pulmonary embolism with acute cor pulmonale Acute deep vein thrombosis of femoral vein of right lower extremity thrombocytopenia  PLAN: -Discussed lab results from today, 11/14/2023, in detail with the patient. CBC shows slightly elevated Hgb of 15.8 g/dL with Hct of 53.6%. CMP stable overall.  -Discussed Lower venous DVT study results from 10/19/2023 in detail with the patient. Showed no evidence of DVT seen in the lower extremities, bilatrally. No evidence of popliteal cyst, bilaterally.  -JAK-2 mutation results is negative.  -Discussed with the patient that since there was no-known reason for her DVT, the recommendation is to continue full-dose Eliquis long term  -Patient agrees with the plan to continue full-dose Eliquis.  -Continue Full-dose Eliquis 5 mg.  Po BID, long-term.  -Answered all of patient's questions.  -Discussed echo-cardiogram results in detail from 09/14/2023 in detail with the patient.  -Continue to follow-up with PCP.  FOLLOW-UP: RTC with PCP RTC with Dr Candise Che if any new questions arise  The total time spent in the appointment was 20 minutes* .  All of the patient's questions were answered with apparent satisfaction. The patient knows to call the clinic with any problems, questions or concerns.   Wyvonnia Lora MD MS AAHIVMS [facility] Hematology/Oncology Physician [facility]  .*Total Encounter Time as defined by the Centers for Medicare and Medicaid Services includes, in addition to the face-to-face time of a patient visit (documented in the note above) non-face-to-face time: obtaining and reviewing outside history, ordering and reviewing medications, tests or procedures, care coordination (communications with other health care professionals or  caregivers) and documentation in the medical record.   I,Param Shah,acting as a Neurosurgeon for Wyvonnia Lora, MD.,have documented all relevant documentation on the behalf of Wyvonnia Lora, MD,as directed by  Wyvonnia Lora, MD while in the presence of Wyvonnia Lora, MD.  .I have reviewed the above documentation for accuracy and completeness, and I agree with the above. Johney Maine MD

## 2023-12-02 ENCOUNTER — Emergency Department (HOSPITAL_BASED_OUTPATIENT_CLINIC_OR_DEPARTMENT_OTHER)
Admission: EM | Admit: 2023-12-02 | Discharge: 2023-12-02 | Disposition: A | Discharge status: Home / Self Care | Attending: Emergency Medicine | Admitting: Emergency Medicine

## 2023-12-02 ENCOUNTER — Other Ambulatory Visit: Payer: Self-pay

## 2023-12-02 DIAGNOSIS — Z23 Encounter for immunization: Secondary | ICD-10-CM | POA: Diagnosis not present

## 2023-12-02 DIAGNOSIS — S8991XA Unspecified injury of right lower leg, initial encounter: Secondary | ICD-10-CM | POA: Diagnosis present

## 2023-12-02 DIAGNOSIS — Z7901 Long term (current) use of anticoagulants: Secondary | ICD-10-CM | POA: Insufficient documentation

## 2023-12-02 DIAGNOSIS — W268XXA Contact with other sharp object(s), not elsewhere classified, initial encounter: Secondary | ICD-10-CM | POA: Diagnosis not present

## 2023-12-02 DIAGNOSIS — S81811A Laceration without foreign body, right lower leg, initial encounter: Secondary | ICD-10-CM | POA: Diagnosis not present

## 2023-12-02 MED ORDER — TETANUS-DIPHTH-ACELL PERTUSSIS 5-2.5-18.5 LF-MCG/0.5 IM SUSY
0.5000 mL | PREFILLED_SYRINGE | Freq: Once | INTRAMUSCULAR | Status: AC
  Administered 2023-12-02: 0.5 mL via INTRAMUSCULAR
  Filled 2023-12-02: qty 0.5

## 2023-12-02 NOTE — ED Provider Notes (Signed)
 [place] EMERGENCY DEPARTMENT AT [facility] Provider Note   CSN: [id number] Arrival date & time: 12/02/23  1716     History  No chief complaint on file.   Julie [name] is a 83 y.o. female.  83 year old female here today for a cut to her right lower extremity.  Patient says that she was moving something for a barbecue and cut her leg on a metal leg.  She has a history of DVT is on Eliquis.        Home Medications Prior to Admission medications   Medication Sig Start Date End Date Taking? Authorizing Provider  APIXABAN Everlene Balls) VTE STARTER PACK (10MG  AND 5MG ) Take as directed on package: start with two-5mg  tablets twice daily for 7 days. On day 8, switch to one-5mg  tablet twice daily. 07/03/23   Narda Bonds, MD  cholecalciferol (VITAMIN D) 1000 units tablet Take 1,000 Units by mouth daily.    [provider]  diphenhydrAMINE (BENADRYL) 25 MG tablet Take 1 tablet (25 mg total) by mouth every 8 (eight) hours as needed for up to 21 doses for allergies or sleep. 07/07/22   Terald Sleeper, MD  levothyroxine (SYNTHROID) 125 MCG tablet Take 125 mcg by mouth daily before breakfast.    [provider]  Multiple Vitamins-Minerals (MULTIVITAMIN WITH MINERALS) tablet Take 1 tablet by mouth daily.    [provider]  Multiple Vitamins-Minerals (PRESERVISION AREDS 2 PO) Take 1 tablet by mouth daily.    [provider]      Allergies    Losartan, Propantheline, Atorvastatin, Gluten meal, Rosuvastatin, Sulfa antibiotics, Zetia [ezetimibe], Demerol [meperidine], and Morphine and codeine    Review of Systems   Review of Systems  Physical Exam Updated Vital Signs BP (!) 180/86 (BP Location: Right Arm)   Pulse 67   Temp 97.6 F (36.4 C) (Oral)   Resp 18   SpO2 100%  Physical Exam Vitals reviewed.  Skin:    Comments: 4.8 cm laceration to the medial aspect of the right lower extremity.  Subcutaneous adipose tissue visible.  No bone, no  active bleeding.  No foreign bodies.     ED Results / Procedures / Treatments   Labs (all labs ordered are listed, but only abnormal results are displayed) Labs Reviewed - No data to display  EKG None  Radiology No results found.  Procedures .Laceration Repair  Date/Time: 12/02/2023 6:21 PM  Performed by: Arletha Pili, DO Authorized by: Arletha Pili, DO   Consent:    Consent obtained:  Verbal Laceration details:    Length (cm):  4.8 Comments:     Area anesthetized using 10 cc of 2% lidocaine with epinephrine.  Area irrigated with 30 cc of sterile saline.  Placed 3 horizontal mattress sutures using 3-0 Ethilon.  Placed 2 simple interrupted sutures using 3-0 Ethilon.  Hemostasis achieved.  Patient tolerated procedure well.     Medications Ordered in ED Medications  Tdap (BOOSTRIX) injection 0.5 mL (has no administration in time range)    ED Course/ Medical Decision Making/ A&P                                 Medical Decision Making Simple laceration to right lower extremity.  Plan-tetanus provided.  Considered x-rays, however wound is very superficial, area explored and there was no visible foreign bodies.  Irrigated.  Wound closed, there was a narrow skin flap an  area that was scraped off the will have to heal by secondary intention.  Return precaution discussed with patient at bedside.  Will discharge.  Risk Prescription drug management.           Final Clinical Impression(s) / ED Diagnoses Final diagnoses:  Laceration of right lower extremity, initial encounter    Rx / DC Orders ED Discharge Orders     None         Arletha Pili, DO 12/02/23 1823

## 2023-12-02 NOTE — ED Triage Notes (Signed)
 Struck right shin on metal. ~3cm Lac adipose exposed. Eliquis-HX PE and DVT. Bleeding minimal. Tetanus not UTD.

## 2023-12-02 NOTE — Discharge Instructions (Signed)
 While you were in the emergency room, you received a tetanus shot.  You had 5 sutures placed in your leg.  The sutures need to be looked at in 7 to 10 days.  You can come to the emergency room to have the sutures removed, go to an urgent care, or follow-up with your PCP.  Tonight, leave the dressing on.  Tomorrow, take the dressing down, gently wash area with soap and water and apply an antibiotic ointment and a bandage.

## 2024-01-10 NOTE — Progress Notes (Signed)
 [name] [address] [place] [address] Phone: [phone] Subjective:   Julie [name], am serving as a scribe for Dr. Ronnell Coins.  I'Julie seeing this patient by the request  of:  Julie Liberty, MD  CC: Right knee and right back pain  [id number]  Julie [name] is a 83 y.o. female coming in with complaint of B knee and back pain. Injection in R knee and R SI joint on 11/07/2023. Patient states that she would like an inejction in R knee.   Also c/o hand tightness in the mornings. Middle and ring fingers are the tightest. Movement helps increase ROM and decrease tightness.   Modifying swing to decrease pain in SI joint. Pain in R side with walking dog.        Past Medical History:  Diagnosis Date   Arthritis    Asthma    CAD in native artery 06/06/2017   Mild LAD disease on coronary CT-A 05/2017.   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 Marilyn [name])   Statin myopathy 09/15/2020   Thyroid  disease    TIA (transient ischemic attack)    2017   Transaminitis 09/15/2020   Past Surgical History:  Procedure Laterality Date   ABDOMINAL HYSTERECTOMY     BLADDER SURGERY     hole in bladder from hysterectomy   BREAST SURGERY     biopsy breast benign   EYE SURGERY     bil cataract   TOTAL HIP ARTHROPLASTY Right 08/06/2019   Procedure: RIGHT TOTAL HIP ARTHROPLASTY ANTERIOR APPROACH;  Surgeon: Julie Even, MD;  Location: [facility];  Service: Orthopedics;  Laterality: Right;   TOTAL THYROIDECTOMY     Social History   Socioeconomic History   Marital status: Media planner    Spouse name: Julie [name]   Number of children: Not on file   Years of education: Not on file   Highest education level: Master's degree  (e.g., MA, MS, MEng, MEd, MSW, MBA)  Occupational History   Not on file  Tobacco Use   Smoking status: Never   Smokeless tobacco: Never  Vaping Use   Vaping status: Never Used  Substance and Sexual Activity   Alcohol use: Yes    Comment: 12/02/21 3 daily   Drug use: No   Sexual activity: Not Currently  Other Topics Concern   Not on file  Social History Narrative   Lives with partner   Social Drivers of Health   Financial Resource Strain: Not on file  Food Insecurity: No Food Insecurity (06/29/2023)   Hunger Vital Sign    Worried About Running Out of Food in the Last Year: Never true    Ran Out of Food in the Last Year: Never true  Transportation Needs: No Transportation Needs (06/29/2023)   PRAPARE - Administrator, Civil Service (Medical): No    Lack of Transportation (Non-Medical): No  Physical Activity: Not on file  Stress: Not on file  Social Connections: Not on file   Allergies  Allergen Reactions   Losartan  Anaphylaxis    Diffuse hives and tongue swelling   Propantheline Rash    Cannot remember other symptoms    Atorvastatin   Myalgia    Gluten Meal Diarrhea    bloating   Rosuvastatin     Sulfa Antibiotics Swelling   Zetia  [Ezetimibe ]     Myalgias    Demerol [Meperidine] Rash   Morphine And Codeine Rash   Family History  Problem Relation Age of Onset   Peripheral Artery Disease Mother    Dementia Father     Current Outpatient Medications (Endocrine & Metabolic):    levothyroxine  (SYNTHROID ) 125 MCG tablet, Take 125 mcg by mouth daily before breakfast.   Current Outpatient Medications (Respiratory):    diphenhydrAMINE  (BENADRYL ) 25 MG tablet, Take 1 tablet (25 mg total) by mouth every 8 (eight) hours as needed for up to 21 doses for allergies or sleep.   Current Outpatient Medications (Hematological):    APIXABAN  (ELIQUIS ) VTE STARTER PACK (10MG  AND 5MG ), Take as directed on package: start with two-5mg  tablets twice daily for 7 days.  On day 8, switch to one-5mg  tablet twice daily.  Current Outpatient Medications (Other):    cholecalciferol (VITAMIN D ) 1000 units tablet, Take 1,000 Units by mouth daily.   Multiple Vitamins-Minerals (MULTIVITAMIN WITH MINERALS) tablet, Take 1 tablet by mouth daily.   Multiple Vitamins-Minerals (PRESERVISION AREDS 2 PO), Take 1 tablet by mouth daily.   Reviewed prior external information including notes and imaging from  primary care provider As well as notes that were available from care everywhere and other healthcare systems.  Past medical history, social, surgical and family history all reviewed in electronic medical record.  No pertanent information unless stated regarding to the chief complaint.   Review of Systems:  No headache, visual changes, nausea, vomiting, diarrhea, constipation, dizziness, abdominal pain, skin rash, fevers, chills, night sweats, weight loss, swollen lymph nodes, body aches, joint swelling, chest pain, shortness of breath, mood changes. POSITIVE muscle aches  Objective  Blood pressure 118/78, height 5\' 5"  (1.651 Julie), weight 144 lb (65.3 kg).   General: No apparent distress alert and oriented x3 mood and affect normal, dressed appropriately.  HEENT: Pupils equal, extraocular movements intact  Respiratory: Patient's speak in full sentences and does not appear short of breath  Cardiovascular: No lower extremity edema, non tender, no erythema  Right knee exam shows crepitus noted.  Tender to palpation noted.  Instability with valgus and varus force.  Back exam shows limited range of motion in all planes.  Significant loss of lordosis.  Pain over the sacroiliac joint bilaterally.  After informed written and verbal consent, patient was seated on exam table. Right knee was prepped with alcohol swab and utilizing anterolateral approach, patient's right knee space was injected with 4:1  marcaine  0.5%: Kenalog  40mg /dL. Patient tolerated the procedure well without  immediate complications.    Impression and Recommendations:    The above documentation has been reviewed and is accurate and complete Julie [name] Julie Dequincy Born, DO

## 2024-01-16 ENCOUNTER — Ambulatory Visit (INDEPENDENT_AMBULATORY_CARE_PROVIDER_SITE_OTHER): Admitting: Family Medicine

## 2024-01-16 ENCOUNTER — Encounter: Payer: Self-pay | Admitting: Family Medicine

## 2024-01-16 VITALS — BP 118/78 | Ht 65.0 in | Wt 144.0 lb

## 2024-01-16 DIAGNOSIS — M1711 Unilateral primary osteoarthritis, right knee: Secondary | ICD-10-CM

## 2024-01-16 DIAGNOSIS — M1611 Unilateral primary osteoarthritis, right hip: Secondary | ICD-10-CM | POA: Diagnosis not present

## 2024-01-16 DIAGNOSIS — M461 Sacroiliitis, not elsewhere classified: Secondary | ICD-10-CM | POA: Diagnosis not present

## 2024-01-16 DIAGNOSIS — M170 Bilateral primary osteoarthritis of knee: Secondary | ICD-10-CM

## 2024-01-16 NOTE — Assessment & Plan Note (Signed)
 Chronic problem noted.  Will need to consider the possibility of injections again.  Patient wants to hold at this time and will continue to work on it.  Wants to hold on any of the back injections as well with patient being and chronic anticoagulation now.  Follow-up again in 6 to 8 weeks

## 2024-01-16 NOTE — Assessment & Plan Note (Signed)
 Chronic exacerbation noted.  Discussed icing regimen and home exercises.  Given another injection which I think will be beneficial.  Discussed icing regimen.  Follow-up with me again in 10 to 12 weeks.

## 2024-01-16 NOTE — Patient Instructions (Signed)
 Injected knee today Keep beating up on people on the golf [place] Tylenol  3x a day See me again in 10-12 weeks

## 2024-03-29 NOTE — Progress Notes (Signed)
 [facility] [address] [place] [address] Phone: [phone] Subjective:   Julie [name], am serving as a scribe for Dr. Arthea Claudene.  I'm seeing this patient by the request  of:  Larnell Hamilton, MD  CC: Right hip and bilateral knee pain  YEP:[id number]  01/16/2024 Chronic problem noted.  Will need to consider the possibility of injections again.  Patient wants to hold at this time and will continue to work on it.  Wants to hold on any of the back injections as well with patient being and chronic anticoagulation now.  Follow-up again in 6 to 8 weeks   Chronic exacerbation noted.  Discussed icing regimen and home exercises.  Given another injection which I think will be beneficial.  Discussed icing regimen.  Follow-up with me again in 10 to 12 weeks.      Update 04/01/2024 Julie [name] is a 83 y.o. female coming in with complaint of R hip and B knees. Patient states that she felt something pull in the R SI joint when she placed suitcase on a bus. Unable to side bend to R side and painful to flex lumbar spine. Injury occurred 9 days ago. Denies any radiating symptoms.   Knee is the same as last visit. Downhill and stairs increase her pain.        Past Medical History:  Diagnosis Date   Arthritis    Asthma    CAD in native artery 06/06/2017   Mild LAD disease on coronary CT-A 05/2017.   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 Verdel)   Statin myopathy 09/15/2020   Thyroid  disease    TIA (transient ischemic attack)    2017   Transaminitis 09/15/2020   Past Surgical History:  Procedure Laterality Date   ABDOMINAL HYSTERECTOMY     BLADDER SURGERY     hole in bladder from hysterectomy   BREAST SURGERY      biopsy breast benign   EYE SURGERY     bil cataract   TOTAL HIP ARTHROPLASTY Right 08/06/2019   Procedure: RIGHT TOTAL HIP ARTHROPLASTY ANTERIOR APPROACH;  Surgeon: Sheril Coy, MD;  Location: [facility];  Service: Orthopedics;  Laterality: Right;   TOTAL THYROIDECTOMY     Social History   Socioeconomic History   Marital status: Media planner    Spouse name: Niels   Number of children: Not on file   Years of education: Not on file   Highest education level: Master's degree (e.g., MA, MS, MEng, MEd, MSW, MBA)  Occupational History   Not on file  Tobacco Use   Smoking status: Never   Smokeless tobacco: Never  Vaping Use   Vaping status: Never Used  Substance and Sexual Activity   Alcohol use: Yes    Comment: 12/02/21 3 daily   Drug use: No   Sexual activity: Not Currently  Other Topics Concern   Not on file  Social History Narrative   Lives with partner   Social Drivers of Health   Financial Resource Strain: Not on file  Food Insecurity: No Food Insecurity (06/29/2023)   Hunger Vital Sign    Worried About Running Out of Food in the Last  Year: Never true    Ran Out of Food in the Last Year: Never true  Transportation Needs: No Transportation Needs (06/29/2023)   PRAPARE - Administrator, Civil Service (Medical): No    Lack of Transportation (Non-Medical): No  Physical Activity: Not on file  Stress: Not on file  Social Connections: Not on file   Allergies  Allergen Reactions   Losartan  Anaphylaxis    Diffuse hives and tongue swelling   Propantheline Rash    Cannot remember other symptoms    Atorvastatin      Myalgia    Gluten Meal Diarrhea    bloating   Rosuvastatin     Sulfa Antibiotics Swelling   Zetia  [Ezetimibe ]     Myalgias    Demerol [Meperidine] Rash   Morphine And Codeine Rash   Family History  Problem Relation Age of Onset   Peripheral Artery Disease Mother    Dementia Father     Current Outpatient Medications (Endocrine &  Metabolic):    levothyroxine  (SYNTHROID ) 125 MCG tablet, Take 125 mcg by mouth daily before breakfast.   predniSONE  (DELTASONE ) 20 MG tablet, Take 1 tablet (20 mg total) by mouth daily with breakfast.   Current Outpatient Medications (Respiratory):    diphenhydrAMINE  (BENADRYL ) 25 MG tablet, Take 1 tablet (25 mg total) by mouth every 8 (eight) hours as needed for up to 21 doses for allergies or sleep.   Current Outpatient Medications (Hematological):    APIXABAN  (ELIQUIS ) VTE STARTER PACK (10MG  AND 5MG ), Take as directed on package: start with two-5mg  tablets twice daily for 7 days. On day 8, switch to one-5mg  tablet twice daily.  Current Outpatient Medications (Other):    cholecalciferol (VITAMIN D ) 1000 units tablet, Take 1,000 Units by mouth daily.   Multiple Vitamins-Minerals (MULTIVITAMIN WITH MINERALS) tablet, Take 1 tablet by mouth daily.   Multiple Vitamins-Minerals (PRESERVISION AREDS 2 PO), Take 1 tablet by mouth daily.   Reviewed prior external information including notes and imaging from  primary care provider As well as notes that were available from care everywhere and other healthcare systems.  Past medical history, social, surgical and family history all reviewed in electronic medical record.  No pertanent information unless stated regarding to the chief complaint.   Review of Systems:  No headache, visual changes, nausea, vomiting, diarrhea, constipation, dizziness, abdominal pain, skin rash, fevers, chills, night sweats, weight loss, swollen lymph nodes, body aches, joint swelling, chest pain, shortness of breath, mood changes. POSITIVE muscle aches  Objective  Blood pressure 104/72, pulse 61, height 5' 5 (1.651 m), SpO2 98%.   General: No apparent distress alert and oriented x3 mood and affect normal, dressed appropriately.  HEENT: Pupils equal, extraocular movements intact  Respiratory: Patient's speak in full sentences and does not appear short of breath   Cardiovascular: No lower extremity edema, non tender, no erythema  Knee does have a trace effusion noted.  Does have some instability noted with valgus and varus force. Patient's right hip severely tender to palpation over the greater trochanteric area.  Positive FABER test noted.  Worsening pain with any type of flexion of the lumbar spine to the right.  Good range of motion though with rotation  After informed written and verbal consent, patient was seated on exam table. Right knee was prepped with alcohol swab and utilizing anterolateral approach, patient's right knee space was injected with 4:1  marcaine  0.5%: Kenalog  40mg /dL. Patient tolerated the procedure well without immediate complications.   After verbal consent patient  was prepped with alcohol swab and with a 21-gauge 2 inch needle injected into the right greater trochanteric area with 2 cc of 0.5% Marcaine  and 1 cc of Kenalog  40 mg/mL.  No blood loss.  Band-Aid placed.  Postinjection instructions given    Impression and Recommendations:    The above documentation has been reviewed and is accurate and complete Mekesha Solomon M Ronell Duffus, DO

## 2024-04-02 ENCOUNTER — Encounter: Payer: Self-pay | Admitting: Family Medicine

## 2024-04-02 ENCOUNTER — Ambulatory Visit (INDEPENDENT_AMBULATORY_CARE_PROVIDER_SITE_OTHER)

## 2024-04-02 ENCOUNTER — Ambulatory Visit: Admitting: Family Medicine

## 2024-04-02 VITALS — BP 104/72 | HR 61 | Ht 65.0 in

## 2024-04-02 DIAGNOSIS — M545 Low back pain, unspecified: Secondary | ICD-10-CM | POA: Diagnosis not present

## 2024-04-02 DIAGNOSIS — M25551 Pain in right hip: Secondary | ICD-10-CM | POA: Diagnosis not present

## 2024-04-02 DIAGNOSIS — M170 Bilateral primary osteoarthritis of knee: Secondary | ICD-10-CM | POA: Diagnosis not present

## 2024-04-02 MED ORDER — PREDNISONE 20 MG PO TABS: 20.0000 mg | ORAL_TABLET | Freq: Every day | ORAL | 0 refills | Status: AC

## 2024-04-02 NOTE — Assessment & Plan Note (Signed)
 Difficult to assess.  Do believe that it is more secondary to a potential tendon injury.  Discussed icing regimen of home exercises.  Discussed which activities to do and which ones to avoid.  Given a injection to help with patient going out of town and a short course of prednisone .  Patient is to monitor for any type of bruising with patient being on Eliquis .  Differential includes lumbar radiculopathy and may need to consider the possibility of a lumbar epidural if continuing to have difficulty.  Has known L4 nerve impingement.  Follow-up again 3 months

## 2024-04-02 NOTE — Assessment & Plan Note (Signed)
 Right knee given injection again.  Discussed icing regimen of home exercises.  Discussed which activities to do in which ones to avoid.  Increase activity slowly.  Patient has responded well to every about 10 to 12 weeks.  We discussed further evaluation in 3 months continue to wear the custom bracing with activities

## 2024-04-02 NOTE — Patient Instructions (Signed)
 Prednisone  20mg  for 5 days Injected hip and knee today See me again in 10-12 weeks

## 2024-05-08 ENCOUNTER — Telehealth: Admitting: Family Medicine

## 2024-05-08 DIAGNOSIS — U071 COVID-19: Secondary | ICD-10-CM | POA: Diagnosis not present

## 2024-05-08 MED ORDER — NIRMATRELVIR/RITONAVIR (PAXLOVID)TABLET
3.0000 | ORAL_TABLET | Freq: Two times a day (BID) | ORAL | 0 refills | Status: AC
Start: 2024-05-08 — End: 2024-05-13

## 2024-05-08 NOTE — Progress Notes (Signed)
 Virtual Visit Consent   [name], you are scheduled for a virtual visit with a [facility] provider today. Just as with appointments in the office, your consent must be obtained to participate. Your consent will be active for this visit and any virtual visit you may have with one of our providers in the next 365 days. If you have a MyChart account, a copy of this consent can be sent to you electronically.  As this is a virtual visit, video technology does not allow for your provider to perform a traditional examination. This may limit your provider's ability to fully assess your condition. If your provider identifies any concerns that need to be evaluated in person or the need to arrange testing (such as labs, EKG, etc.), we will make arrangements to do so. Although advances in technology are sophisticated, we cannot ensure that it will always work on either your end or our end. If the connection with a video visit is poor, the visit may have to be switched to a telephone visit. With either a video or telephone visit, we are not always able to ensure that we have a secure connection.  By engaging in this virtual visit, you consent to the provision of healthcare and authorize for your insurance to be billed (if applicable) for the services provided during this visit. Depending on your insurance coverage, you may receive a charge related to this service.  I need to obtain your verbal consent now. Are you willing to proceed with your visit today? Julie [name] has provided verbal consent on 05/08/2024 for a virtual visit (video or telephone). [name], FNP  Date: 05/08/2024 6:12 PM   Virtual Visit via Video Note   I, [name], connected with  Julie [name]  ([id number], 11-13-1940) on 05/08/24 at  7:30 PM EDT by a video-enabled telemedicine application and verified that I am speaking with the correct person using two identifiers.  Location: Patient: Virtual Visit Location Patient: Home Provider:  Virtual Visit Location Provider: Home Office   I discussed the limitations of evaluation and management by telemedicine and the availability of in person appointments. The patient expressed understanding and agreed to proceed.    History of Present Illness: Julie [name] is a 83 y.o. who identifies as a female who was assigned female at birth, and is being seen today for sore throat and dry cough, first started Monday. She tested positive for covid at home. No fever. In no distress. 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   Pre-operative clearance 07/31/2019   Arthritis of right hip 07/16/2019   Right hip pain 06/27/2019   CAD in native artery 06/06/2017   Dyslipidemia, goal LDL below 70 05/11/2017   CMC arthritis, thumb, 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   Closed fracture of middle third of scaphoid bone of left wrist 03/31/2016   Lateral meniscal tear 01/25/2016   Patellofemoral arthralgia of right knee 01/25/2016   History of TIA (transient ischemic attack) 10/05/2015   Polycythemia 10/05/2015   Diplopia 10/05/2015   Thyroid  disease  Celiac disease     Allergies:  Allergies  Allergen Reactions   Losartan  Anaphylaxis    Diffuse hives and tongue swelling   Propantheline Rash    Cannot remember other symptoms    Atorvastatin      Myalgia    Gluten Meal Diarrhea    bloating   Rosuvastatin     Sulfa Antibiotics Swelling   Zetia  [Ezetimibe ]     Myalgias    Demerol [Meperidine] Rash   Morphine And Codeine Rash   Medications:   Current Outpatient Medications:    nirmatrelvir /ritonavir  (PAXLOVID ) 20 x 150 MG & 10 x 100MG  TABS, Take 3 tablets by mouth 2 (two) times daily for 5 days. (Take nirmatrelvir  150 mg two tablets twice daily for 5 days and ritonavir  100 mg one tablet twice daily for 5 days) Patient GFR is normal., Disp: 30 tablet, Rfl: 0   APIXABAN  (ELIQUIS ) VTE STARTER PACK (10MG  AND 5MG ), Take as directed on package: start with two-5mg  tablets twice daily for 7 days. On day 8, switch to one-5mg  tablet twice daily., Disp: 74 each, Rfl: 0   cholecalciferol (VITAMIN D ) 1000 units tablet, Take 1,000 Units by mouth daily., Disp: , Rfl:    diphenhydrAMINE  (BENADRYL ) 25 MG tablet, Take 1 tablet (25 mg total) by mouth every 8 (eight) hours as needed for up to 21 doses for allergies or sleep., Disp: 21 tablet, Rfl: 0   levothyroxine  (SYNTHROID ) 125 MCG tablet, Take 125 mcg by mouth daily before breakfast., Disp: , Rfl:    Multiple Vitamins-Minerals (MULTIVITAMIN WITH MINERALS) tablet, Take 1 tablet by mouth daily., Disp: , Rfl:    Multiple Vitamins-Minerals (PRESERVISION AREDS 2 PO), Take 1 tablet by mouth daily., Disp: , Rfl:    predniSONE  (DELTASONE ) 20 MG tablet, Take 1 tablet (20 mg total) by mouth daily with breakfast., Disp: 5 tablet, Rfl: 0  Observations/Objective: Patient is well-developed, well-nourished in no acute distress.  Resting comfortably  at home.  Head is normocephalic, atraumatic.  No labored breathing.  Speech is clear and coherent with logical content.  Patient is alert and oriented at baseline.    Assessment and Plan: 1. Positive self-administered antigen test for COVID-19 (Primary)  Increase fluids, see information provided, UC as needed.   Follow Up Instructions: I discussed the assessment and treatment plan with the patient. The patient was provided an opportunity to ask questions and all were answered. The patient agreed with the plan and demonstrated an understanding of the instructions.   A copy of instructions were sent to the patient via MyChart unless otherwise noted below.     The patient was advised to call back or seek an in-person evaluation if the symptoms worsen or if the condition fails to improve as anticipated.    Galan Ghee, FNP

## 2024-05-08 NOTE — Patient Instructions (Signed)
 COVID-19: What to Know COVID-19 is an infection caused by a virus called SARS-CoV-2. This type of virus is called a coronavirus. People with COVID-19 may: Have few to no symptoms. Have mild to moderate symptoms that affect their lungs and breathing. Get very sick. What are the causes?  COVID-19 is caused by a virus. This virus may be in the air as droplets or on surfaces. It can spread from an infected person when they cough, sneeze, speak, sing, or breathe. You may become infected if: You breathe in the infected droplets in the air. You touch an object that has the virus on it. What increases the risk? You are at risk of getting COVID-19 if you have been around someone with the infection. You may be more likely to get very sick if: You are 82 years old or older. You have certain medical conditions, such as: Heart disease. Diabetes. Long-term respiratory disease. Cancer. Pregnancy. You are immunocompromised. This means your body can't fight infections easily. You have a disability that makes it hard for you to move around, you have trouble moving, or you can't move at all. What are the signs or symptoms? People may have different symptoms from COVID-19. The symptoms can also be mild to very bad. They often show up in 5-6 days after being infected. But, they can take up to 14 days to appear. Common symptoms are: Cough. Feeling tired. New loss of taste or smell. Fever. Less common symptoms are: Sore throat. Headache. Body or muscle aches. Diarrhea. A skin rash or fingers or toes that are a different color than usual. Red or irritated eyes. Sometimes, COVID-19 does not cause symptoms. How is this diagnosed? COVID-19 can be diagnosed with tests done in the lab or at home. Fluid from your nose, mouth, or lungs will be used to check for the virus. How is this treated? Treatment for COVID-19 depends on how sick you are. Mild symptoms can be treated at home with rest, fluids, and  over-the-counter medicines. very bad symptoms may be treated in a hospital intensive care unit (ICU). If you have symptoms and are at risk of getting very sick, you may be given a medicine that fights viruses. This medicine is called an antiviral. How is this prevented? To protect yourself from COVID-19: Know your risk factors. Get vaccinated. If your body can't fight infections easily, talk to your provider about treatment to help prevent COVID-19. Stay at least about 3 feet (1 meter) away from other people. Wear mask that fits well when: You can't stay at a distance from people. You're in a place with not a lot of air flow. Try to be in open spaces with good air flow when you are in public. Wash your hands often or use an alcohol-based hand sanitizer. Cover your nose and mouth when you cough or sneeze. If you think you have COVID-19 or have been around someone who has it, stay home and away from other people as told by your provider or health officials. Where to find more information To learn more: Go to [employer] Click Health Topics. Type COVID-19 in the search box. Go to VisitDestination.com.br Click Health Topics. Then click All Topics. Type COVID-19 in the search box. Get help right away if: You have trouble breathing or get short of breath. You have pain or pressure in your chest. You're feeling confused. These symptoms may be an emergency. Get help right away. Call 911. Do not wait to see if the symptoms will go away.  Do not drive yourself to the hospital. This information is not intended to replace advice given to you by your health care provider. Make sure you discuss any questions you have with your health care provider. Document Revised: 05/25/2023 Document Reviewed: 05/17/2023 Elsevier Patient Education  2025 [employer].

## 2024-05-28 ENCOUNTER — Other Ambulatory Visit: Payer: Medicare Other

## 2024-06-08 NOTE — ED Provider Notes (Signed)
 Clinical Impression   1. Avulsion of soft tissue of right lower leg, initial encounter   2. Laceration of right lower extremity, initial encounter    ED Course/ Medical Decision Making/ Complexity of Problems Addressed  Differential diagnosis: Includes but is not limited to laceration, skin avulsion, open fracture.  83 y.o. female presented for emergency evaluation. Given her presenting symptoms, severity of illness, possibility of diagnosis requiring emergency surgery, and potential need for hospital admission, I ordered an IV, imaging, medications, and wound care.     External Data Reviewed : Clinic notes reviewed prior visits & exams)  Assessment and plan: The patient is an 83 year old female presenting with 2 large laceration/skin avulsions to the right anterior tibial region sustained from the step of a box. On examination each wound measures approximately 5 x 5 cm.  The inferior wound has exposed bone.  She is neurovascularly intact distal to the wounds.  X-ray was obtained and reviewed which shows no evidence of fracture or foreign body. She was given prophylactic Ancef  and tetanus. Given the significant soft tissue injury, she has very thin friable skin I do not think that this could be repaired in the emergency department.  Also obtaining adequate anesthesia with the amount of surface area injured would be very challenging and likely not safe.  Therefore I have consulted with orthopedics and surgery and discussed options with the patient.  Management of patient discussed with consultants/discussions : Consultant/ Specialist (discussed tx & interventions) Orthopedics, Dr. Tanda, recommendation to cover the wounds with wet-to-dry's, admit to the hospitalist overnight and he will evaluate the wounds in the morning to determine if a need repair in the operating room. Patient is in agreement with this plan with hospital admission. I have discussed with Dr. Tobie, hospital medicine, and she  is accepted for observation admission.  Imaging reviewed and interpreted independently by me. Tib-fib x-ray demonstrate(s) no acute abnormality    Disposition  ED Disposition    ED Disposition  IP Bed Requested      DECISION TO ADMIT     Date/Time Event User Comments   06/08/24 2307 Admit Disposition Selected JEKICH, BRIAN ED Disposition set to IP Bed Requested.      History   Chief Complaint  Patient presents with  . Leg Injury   HPI - The patient is a 83 y.o. female who presents for evaluation of  right leg laceration.  She was getting on the bus when she stumbled striking her right lower leg on the metal step of the bus causing a large laceration.  This happened shortly prior to arrival.  She has moderate pain, she denies any numbness or weakness.  Last tetanus is unknown.    Past Medical History:  Diagnosis Date  . Hypothyroid   . Macular degeneration   . PE (pulmonary thromboembolism) (HC CODE)    Prior to Admission medications  Not on File   Allergies[1]  Collapsed by default[2] Social History   Substance and Sexual Activity  Drug Use Not on file   History reviewed. No pertinent family history. ROS / Physical Exam   Review of Systems  Complete review of systems performed and negative except as outlined in HPI and ROS below    Blood pressure 172/81, pulse 66, temperature 36.5 C (97.7 F), temperature source Temporal, resp. rate 16, height 1.651 m (5' 5), weight 63.5 kg (140 lb), SpO2 95%.    Physical Exam Vitals and nursing note reviewed.  Constitutional:  General: She is not in acute distress.    Appearance: She is not ill-appearing.  HENT:     Head: Normocephalic and atraumatic.     Nose: Nose normal.     Mouth/Throat:     Mouth: Mucous membranes are moist.     Pharynx: Oropharynx is clear.  Eyes:     Pupils: Pupils are equal, round, and reactive to light.  Cardiovascular:     Rate and Rhythm: Normal rate and regular rhythm.  Pulmonary:      Effort: Pulmonary effort is normal.     Breath sounds: Normal breath sounds.  Abdominal:     Palpations: Abdomen is soft.     Tenderness: There is no abdominal tenderness.  Musculoskeletal:        General: No deformity. Normal range of motion.     Comments: There are 2 open wounds overlying the anterior tibia measuring each 5 x 5 cm.  The inferior wound has exposed bone.  The soft tissue is avulsed with skin flaps and macerated.  Minimal bleeding.  She is able to wiggle all toes and sense light touch in her foot distal to the wounds.  Palpable 2+ DP PT pulse.  The wounds do not appear contaminated and there is no foreign bodies noted.   Skin:    General: Skin is warm and dry.     Capillary Refill: Capillary refill takes less than 2 seconds.  Neurological:     General: No focal deficit present.     Mental Status: She is alert.    Media Information  Laboratory and/or Diagnostics  Labs Ordered & Reviewed By Me:  Labs Reviewed  CBC PLUS WBC DIFFERENTIAL (ORDERABLE)   Narrative:    The following orders were created for panel order CBC w/ Manual Diff if Auto Fails. Procedure                               Abnormality         Status                    ---------                               -----------         ------                    CBC plus WBC [id number]]                                                  Please view results for these tests on the individual orders.  BASIC METABOLIC PANEL  CBC PLUS WBC DIFFERENTIAL (PERFORMABLE)   EKG: No results found for this visit on 06/08/24. Imaging: XR TIBIA FIBULA 2 VIEWS-RIGHT    (Results Pending)   Orders   Consult Orders (From admission, onward)    None      Encounter Meds (From admission, onward)    Start Ordered     Status Ordering Provider   06/08/24 2214 06/08/24 2213  acetaminophen  (TYLENOL ) tablet 1,000 mg  ONCE        Last MAR action: Given JEKICH, BRIAN MICHAEL   06/08/24 2149 06/08/24 2148  ceFAZolin   (ANCEF )  2,000 mg in sterile water for injection 10 mL for IV Push  ONCE        Last MAR action: Given JEKICH, BRIAN MICHAEL   06/08/24 2149 06/08/24 2148  tetanus, diphtheria, acel pertussis (Tdap) (BOOSTRIX ) syringe 0.5 mL  ONCE        Last MAR action: Given JEKICH, BRIAN MICHAEL      Discharge Orders (From admission, onward)    None      Procedures  Procedures Redell Ozell Lam 11:09 PM       [1] Allergies Allergen Reactions  . Demerol [Meperidine]   . Morphine   . Sulfa (Sulfonamide Antibiotics)   [2]    Lam Redell Ozell, MD 06/08/24 2309

## 2024-06-09 NOTE — H&P (View-Only) (Signed)
 Xray of L elbow shows possible fracture vs artifact. Will defer further imaging/management pending in person ortho eval in am

## 2024-06-09 NOTE — Discharge Summary (Signed)
 Discharge Summary             Patient Name: Julie [name] Medical Record Number: [id number]     DOB: December 07, 1940  Admit Date: 06/08/2024  Discharge Date: 06/09/24  Discharge Physician/Service: Kassie, Medicine Service Team: Hospital Medicine   Final Diagnoses Mechanical fall c/b R Lower Extremity Laceratio   Key Points for Follow Up:  Monitor for signs of infection/wound worsening, continue wound vac until follow up back home   Pending Results:    Condition at Discharge: Medically Stable  Reason for Hospitalization: Mechanical fall c/b  R Lower Extremity [facility] Course by Problem List:  Mechanical fall c/b  R Lower Extremity Laceration and  L elbow contusion:  - received perioperative abx, keflex rx sent to pharmacy, pain meds sent - continue wound vac, follow up with team back home, flying out tomorrow   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 Per report, her PCP checked her thyroid  levels in the past 6 months and they were wnl. - Continue PTA levothyroxine  125 mcg qhs   Celiac disease: - stable    Procedures Performed: RLE I&D, partial wound closure, Wound vac placement Pertinent Diagnostic Results: Radiology: XR ELBOW 3 VIEW (AP,OBL,LAT)-LEFT Result Date: 06/09/2024 Indeterminate cortical lucency in the medial condyle could reflect a nondisplaced fracture or exaggeration of the normal bony architecture. Electronically signed by:  Mabel Edison DO  06/09/2024 03:17 AM MDT RP Workstation: [id number]  XR TIBIA FIBULA 2 VIEWS-RIGHT Result Date: 06/08/2024 1.  No acute fracture or dislocation. . 2.   Limited/incomplete visualization of the distal fibula/lateral malleolus and suboptimal assessment of the periarticular distal tibia. 3.  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  cephalexin 500 mg capsule Quantity: 30 capsule    KEFLEX  Take 1 capsule by mouth 3 times daily for 10 days for skin and skin structure infection.   oxyCODONE  5 mg immediate release tablet Quantity: 30 tablet    ROXICODONE   Take 1 tablet by mouth every 6 hours as needed for Pain.       These are medications you reported to us  during your visit      Commonly known as: How do I take this medication?  ELIQUIS  PO       LEVOTHYROXINE  PO     Take 125 mcg by mouth nightly at bedtime.         Where to Get Your Medications     These medications were sent to [facility] [address] [place], [place] - [address]  [address] [place] [address]    Phone: [phone]  cephalexin 500 mg capsule oxyCODONE  5 mg immediate release tablet     Disposition/Plans for Post-Hospital Care/Assistance: Home   Physical Exam on Day of Discharge: Visit Vitals BP 123/65  Pulse 62  Temp 36.1 C (97 F) (Temporal)  Resp 16  Ht 1.651 m (5' 5)  Wt 67.7 kg (149 lb 4 oz)  SpO2 93%  BMI 24.84 kg/m   General appearance: alert, appears stated age, and cooperative  Room Air or L/minute of O2 on Day of Discharge: O2 Status: O2 Status: RA (06/09/2024  1:14 PM)   Instructions Provided to the Patient and Family at Discharge     Future Labs/Procedures   Initiate Anesthesia Protocol         Discharge Orders     Future Orders Complete By Expires   Vannie  As directed        Contact information for after-discharge care           Durable Med Equipment     DME - [facility] [place], [place]    [address] [place] [address]             I personally spent a total of  35 minutes on  preparing to see the patient, on direct patient care, and documenting.    Evan Michael Hardesty, MD 06/09/2024  Copies of this summary should be routed to the following: Primary Care Provider: Asked, Unabletoprovide Referring Provider: Self, Self                                                                                              [1] Allergies Allergen Reactions  . Demerol [Meperidine]   . Gluten   . Morphine   . Statins-Hmg-Coa Reductase Inhibitors MYALGIA  . Sulfa (Sulfonamide Antibiotics)

## 2024-06-09 NOTE — H&P (Addendum)
 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 Admitted for likely OR repair in am.   #Mechanical fall c/b  # R Lower Extremity Laceration and # L elbow contusion:   Patient had a mechanical fall w/ significant RLE laceration (see media tab) prompting admission. ED spoke w/ ortho, who will likely repair in OR on 10/5 ~ 8:30.  Perioperative RISK: GLENWOOD Bring perioperative cardiac risk 0.2 % - Gupta postoperative respiratory failure risk 0.3 %  Management: - S/p cefazolin  2g IV X 1 in ED  Will likely need a preoperative dose  No e/o acute infection on exam requiring ongoing cefazolin  therapy at this time - NPO midnight - APAP 1g TID scheduled, oxy 5 q 4 prn - Bowel reg prn - Extensive discussion w/ patient and pharmacy regarding options for her perioperative AntiCoagulation. Patient last took her Apixaban  5mg  on 10/4 am. She understands that she is at increased risk of a VTE in the perioperative time, but would like to hold her PTA apixaban  AND any SQH or Enoxaparin  at this time. She notes she has held a few doses of her DOAC before for cataract surgery and nto had any issues.  #Hx of Acute intermediate risk PE: Patient hospitalized ~ 06/29/2023 for dyspnea, found to have acute bilateral PE w/ TTE e/o Right heart strain. Currently maintained on apixaban  5 bid, last dose 10/4 am.  -  Extensive AC discussion w/ patient as above- patient elected to defer VTE ppx at this time  #Respiratory Alkalosis with partial compensation:  Initial BMP w/ Hco3 17. VBG w/ ~ pH 7.469, CO2 28. Likely indicates a respiratory alkalosis (2/2 altitude) w/ partial metabolic compensation. - No further intervention at this time    #Hypothyroidism:  Patient reports she takes her levothyroxine  125 mcg qhs, and has been  doing this for years. Per report, her PCP checked her thyroid  levels in the past 6 months and they were wnl. - Continue PTA levothyroxine  125 mcg qhs  #Celiac disease: - Will need a gluten free diet when she is no longer npo  #Etoh use:  Patient reports ~ 1 drink/night, last 10/4. Reports has gone periods without etoh and no hx of withdrawal reported.   #Full Code/Full Tube  - Patient notes she would be okay with initial resuscitation but has no desire to be in a vegetative state for a prolonged period of time. We discussed that in the unlikely event of a cardiac arrest, one potential option would be to initiate CPR and have her proxy/MDPOA determine when we should stop efforts. She elected for this method. Her MDPOA is her partner Niels Rase [phone]  #NPO   Code Status Full Code- see above  Chief Complaint Fall  HPI Alta Goding is a 83 y.o. 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  Patient is from [facility] and has been visiting for a wedding. Had the wedding earlier on 10/4 which went well. Afterwards was trying to get on the bus when one of her legs gave out. She fell and scraped her RLE significantly, prompting presentation to [facility] ED. Patient denies any head trauma, LOC, or prodromal sx prior to the fall.  On ROS, patient notes she had some headaches and chills transiently when she first came to altitude, but none now. No hx of tobacco, or other drugs. Drinks ~ 1 glass of wine a day, last 10/4 evening. No hx of etoh withdrawal even when she has gone days without etoh.  Past Medical History[1] Social History   Tobacco Use  . Smoking status: Never  . Smokeless tobacco: Never  Substance Use Topics  . Alcohol use: Yes    Alcohol/week: 7.0 standard drinks of alcohol    Types: 7 Standard drinks or equivalent per week     Past  Surgical History[2]    Social History   Substance and Sexual Activity  Drug Use Never     Prior to Admission medications as of 06/08/24 2148  Medication Sig  apixaban  (ELIQUIS  PO)   levothyroxine  sodium (LEVOTHYROXINE  PO) Take 125 mcg by mouth nightly at bedtime.           Current Medications Continuous Infusions:Collapsed by default[3] Scheduled Meds: . acetaminophen   1,000 mg Oral TID  . [Held by provider] apixaban   5 mg Oral BID  . ceFAZolin   2,000 mg Intravenous Q8H  . levothyroxine   125 mcg Oral Bedtime   PRN Meds: Collapsed by default[4]   Allergies[5]   Review of Systems Review of Systems  Constitutional:  Positive for chills (Transient chills on bus, now resolved). Negative for fever.  HENT:  Negative for congestion and sore throat.   Eyes:  Negative for visual disturbance.  Respiratory:  Negative for shortness of breath.   Cardiovascular:  Negative for chest pain.  Gastrointestinal:  Negative for abdominal pain, constipation, diarrhea, nausea and vomiting.  Genitourinary:  Negative for dysuria.  Skin:  Positive for wound.  Neurological:  Positive for headaches (Now resolved).    Physical Exam Blood pressure 169/65, pulse 56, temperature 36.6 C (97.9 F), temperature source Temporal, resp. rate 18, height 1.651 m (5' 5), weight 67.7 kg (149 lb 4 oz), SpO2 96%. Body mass index is 24.84 kg/m.   Physical exam was conducted with Tytocare(TM) peripheral tools for visualization of the patient and digital transmission of audio. Findings may be limited by technology.  General: patient was alert and oriented x3, in NAD. Slightly hard of hearing CVS: S1S2 were present, no murmurs, rubs or gallops. Pulm: clear to auscultation bilaterally anteriorly Abd: soft, NT, ND per Rn palpation Ext: See Media tab Neuro: no gross deficits      Results/Verification of Data Review Recent Results (from the past 24 hours)  Basic Metabolic Panel   Collection Time:  06/08/24 11:03 PM  Result Value Ref Range   Sodium Serum/Plasma 138 136 - 145 mmol/L   Potassium Serum/Plasma 3.9 3.5 - 5.1 mmol/L   Chloride Serum/Plasma 106 98 - 112 mmol/L   Carbon Dioxide 17 (L) 18 - 26 mmol/L   Glucose Serum/Plasma 82 68 - 99 mg/dL   Blood Urea Nitrogen 11 10 - 20 mg/dL   Creatinine Serum/Plasma 0.67 0.55 - 1.02 mg/dL   Estimated GFR 87 >=39 mL/min/1.73 square meters   Calcium  Serum/Plasma 9.0 8.8 - 10.3 mg/dL   Anion Gap 15 4 - 16 mmol/L  CBC plus WBC Differential   Collection Time: 06/08/24 11:03 PM  Result Value Ref Range   White Blood Cell Count 5.8 3.0 - 10.0 10*9/L   Red Blood Cell Count 4.72 4.00 - 5.20 10*12/L   Hemoglobin 15.5 13.0 - 16.0 g/dL   Hematocrit 54.0 61.9 - 47.0 %   Mean  Corpuscular Volume 97.2 84.0 - 100.0 fL   Mean Corpuscular Hemoglobin 32.8 28.0 - 34.0 pg   Mean Corpuscular Hemoglobin Concentration 33.8 33.0 - 35.0 g/dL   Platelet Count 679 849 - 400 10*9/L   Red Cell Distribution Width CV 13.2 11.0 - 15.0 %   NRBC Percent 0.0 <=0.0 %   NRBC Absolute 0.00 <=0 10*9/L   Neutrophils Absolute 2.9 1.0 - 5.2 10*9/L   Lymphocyte Absolute 2.2 1.0 - 3.0 10*9/L   Monocytes Absolute 0.5 0.1 - 0.7 10*9/L   Eosinophils Absolute 0.2 0.0 - 0.3 10*9/L   Basophils Absolute 0.0 0.0 - 0.1 10*9/L   Immature Granulocytes Absolute 0.0 <=0.1 10*9/L   Neutrophil Percent 49.6 %   Lymphocyte Percent 37.2 %   Monocytes Percent 9.2 %   Eosinophils Percent 2.8 %   Basophils Percent 0.7 %   Immature Granulocytes Percent 0.5 %  Hepatic Function Panel   Collection Time: 06/08/24 11:03 PM  Result Value Ref Range   Alkaline Phosphatase Total 67 40 - 150 U/L   Aspartate Aminotransferase 18 5 - 34 U/L   Alanine Aminotransferase 13 0 - 55 U/L   Bilirubin Total 0.6 0.3 - 1.2 mg/dL   Bilirubin Direct 0.2 0.0 - 0.5 mg/dL   Protein Total Serum/Plasma 7.0 6.2 - 8.1 g/dL   Albumin  3.9 3.2 - 4.6 g/dL  Magnesium Serum   Collection Time: 06/08/24 11:03 PM  Result  Value Ref Range   Magnesium Serum/Plasma 2.0 1.6 - 2.6 mg/dL  Phosphorus Serum   Collection Time: 06/08/24 11:03 PM  Result Value Ref Range   Phosphorus Serum/Plasma 2.9 2.3 - 4.7 mg/dL  ECG (Electrocardiogram) 12 Lead   Collection Time: 06/09/24 12:20 AM  Result Value Ref Range   Ventricular Rate 57 bpm   Atrial Rate 57 ms   P-R Interval 180 ms   P Axis 60 deg   QRS Duration 106 ms   QT 435 ms   QTc 425 ms   R Axis -39 deg   T Axis 40 deg  POCT VBG   Collection Time: 06/09/24  1:13 AM  Result Value Ref Range   pH Venous -POCT 7.469 (H) 7.320 - 7.420   PCO2 Venous -POCT 27.9 (L) 40.0 - 50.0 mmHg   PO2 Venous -POCT 42 28 - 47 mmHg   TCO2 Venous -POCT 21 (L) 24 - 29 mmol/L   HCO3 Venous -POCT 20.3 (L) 23.0 - 28.0 mmol/L   Base Excess Venous -POCT -3 (L) -2 - 3 mmol/L   O2 Sat Venous -POCT 82 No reference range %   Sample Type -POCT VEN    Cardiopulmonary Bypass -POCT No     TIME/COMMUNICATION N/A      Melvenia Zachary Blanch, MD  06/09/2024  [facility] HOSPITALIST   The patient was seen over live interactive videoconferencing and Akima has consented for live interactive videoconferencing.  I discussed the use of videoconferencing with the patient including alternative methods for meeting, the limits of confidentiality and emergency procedures and resources.   I certify that I am licensed to practice in the state in which the patient is located. The patient is located in: [place] , at other location  My medical decision making included the following:  Complexity: Illness or injury with threat to life or bodily function  Data review: Review or ordered the following unique tests: Reviewed cbc, bmp, and X-ray Tib Fib impression and Discussion of management or test interpretation with ED physician.   Risk: High due to  decision to admit or escalation in hospital level of care          [1] Past Medical History: Diagnosis Date  . Hypothyroid   . Macular degeneration    . PE (pulmonary thromboembolism) (HC CODE)   [2] Past Surgical History: Procedure Laterality Date  . 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 EYE SURGERY     cataract  . HYSTERECTOMY     Sometime in the 1980s, with complications of bladder fistula  . REVISION TOTAL HIP ARTHROPLASTY    [3] [4] .  melatonin .  oxyCODONE  .  polyethylene glycol .  senna [5] Allergies Allergen Reactions  . Demerol [Meperidine]   . Morphine   . Statins-Hmg-Coa Reductase Inhibitors MYALGIA  . Sulfa (Sulfonamide Antibiotics)

## 2024-06-10 ENCOUNTER — Encounter: Payer: Self-pay | Admitting: Family Medicine

## 2024-06-11 ENCOUNTER — Encounter: Payer: Self-pay | Admitting: Physician Assistant

## 2024-06-11 ENCOUNTER — Ambulatory Visit: Admitting: Physician Assistant

## 2024-06-11 DIAGNOSIS — T148XXA Other injury of unspecified body region, initial encounter: Secondary | ICD-10-CM

## 2024-06-11 DIAGNOSIS — S81812A Laceration without foreign body, left lower leg, initial encounter: Secondary | ICD-10-CM | POA: Diagnosis not present

## 2024-06-11 NOTE — Progress Notes (Addendum)
 Office Visit Note   Patient: Julie [name]           Date of Birth: 1941/05/01           MRN: [id number] Visit Date: 06/11/2024              Requested by: Larnell Hamilton, MD [address] [place],  [place] [address] PCP: Larnell Hamilton, MD  Chief Complaint  Patient presents with   Left Leg - Wound Check    Went to ED, had to clean left leg out from laceration. Placed a wound vac on but needs it changed out per pt.      HPI: 83 y/o female who presented to the ED in [place]  after a mechanical fall.  She was taken to the OR for irrigation and vac placement.  She is here for follow up.  Possible vac re application.     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   Assessment & Plan: Visit Diagnoses:  1. Avulsion of skin     Plan: Shw was instructed on wet to dry Vashe dressing changes daiy.  Shower with soap and water daily as needed.  WBAT.  Elevation when at rest.  She was given a handout on elevation above the level of her heart.     Follow-Up Instructions: Return in about 2 weeks (around 06/25/2024).   Ortho Exam  Patient is alert, oriented, no adenopathy, well-dressed, normal affect, normal respiratory effort. Anterior shin 2 wounds.  Skin avulsion was sutured back over the raw tissue.  2 large laceration/skin avulsions to the right anterior tibial region.  The wounds measure 7 x 5 cm and 7 x 4.5 cm Distally and  medial the skin edge is darker in color.  Palpable DP pulses.      Imaging:   Labs: Lab Results  Component Value Date   HGBA1C 5.7 (H) 10/05/2015   ESRSEDRATE 14 10/05/2015     Lab Results  Component Value Date   ALBUMIN  4.0 11/14/2023   ALBUMIN  4.4 08/11/2023   ALBUMIN  2.8 (L) 06/30/2023    No results found for: MG No results found for: VD25OH  No results found for: PREALBUMIN    Latest Ref Rng & Units 11/14/2023    10:38 AM 08/11/2023   11:58 AM 07/02/2023    3:23 AM  CBC EXTENDED  WBC 4.0 - 10.5 K/uL 7.1  6.1  5.6   RBC 3.87 - 5.11 MIL/uL 5.05  4.81  4.03   Hemoglobin 12.0 - 15.0 g/dL 84.1  84.2  86.5   HCT 36.0 - 46.0 % 46.9  46.5  38.9   Platelets 150 - 400 K/uL 271  328  149   NEUT# 1.7 - 7.7 K/uL 4.3  3.6    Lymph# 0.7 - 4.0 K/uL 2.1  1.9       There is no height or weight on file to calculate BMI.  Orders:  No orders of the defined types were placed in this encounter.  No orders of the defined types were placed in this encounter.    Procedures: No procedures performed  Clinical Data: No additional findings.  ROS:  All other systems negative, except as noted in the HPI. Review of Systems  Objective: Vital Signs: There were no vitals taken for this visit.  Specialty Comments:  No specialty comments available.  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   Memory loss 10/15/2021   Statin myopathy 09/15/2020   Transaminitis 09/15/2020   Degenerative arthritis of knee, bilateral 05/11/2020   Primary localized osteoarthritis of right hip 08/06/2019   Primary osteoarthritis of right hip 08/06/2019   Pre-operative clearance 07/31/2019   Arthritis of right hip 07/16/2019   Right hip pain 06/27/2019   CAD in native artery 06/06/2017   Dyslipidemia, goal LDL below 70 05/11/2017   CMC arthritis, thumb, 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   Closed fracture of middle third of scaphoid bone of left wrist 03/31/2016   Lateral meniscal tear 01/25/2016   Patellofemoral arthralgia of right knee 01/25/2016   History of TIA (transient ischemic attack) 10/05/2015   Polycythemia 10/05/2015   Diplopia 10/05/2015   Thyroid  disease    Celiac disease    Past Medical History:  Diagnosis Date    Arthritis    Asthma    CAD in native artery 06/06/2017   Mild LAD disease on coronary CT-A 05/2017.   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 Verdel)   Statin myopathy 09/15/2020   Thyroid  disease    TIA (transient ischemic attack)    2017   Transaminitis 09/15/2020    Family History  Problem Relation Age of Onset   Peripheral Artery Disease Mother    Dementia Father     Past Surgical History:  Procedure Laterality Date   ABDOMINAL HYSTERECTOMY     BLADDER SURGERY     hole in bladder from hysterectomy   BREAST SURGERY     biopsy breast benign   EYE SURGERY     bil cataract   TOTAL HIP ARTHROPLASTY Right 08/06/2019   Procedure: RIGHT TOTAL HIP ARTHROPLASTY ANTERIOR APPROACH;  Surgeon: Sheril Coy, MD;  Location: [facility];  Service: Orthopedics;  Laterality: Right;   TOTAL THYROIDECTOMY     Social History   Occupational History   Not on file  Tobacco Use   Smoking status: Never   Smokeless tobacco: Never  Vaping Use   Vaping status: Never Used  Substance and Sexual Activity   Alcohol use: Yes    Comment: 12/02/21 3 daily   Drug use: No   Sexual activity: Not Currently

## 2024-06-19 ENCOUNTER — Other Ambulatory Visit: Payer: Self-pay | Admitting: Physician Assistant

## 2024-06-19 DIAGNOSIS — T148XXA Other injury of unspecified body region, initial encounter: Secondary | ICD-10-CM

## 2024-06-19 MED ORDER — OXYCODONE-ACETAMINOPHEN 5-325 MG PO TABS: 1.0000 | ORAL_TABLET | Freq: Four times a day (QID) | ORAL | 0 refills | Status: AC | PRN

## 2024-06-25 ENCOUNTER — Ambulatory Visit: Admitting: Physician Assistant

## 2024-06-25 ENCOUNTER — Ambulatory Visit: Admitting: Family Medicine

## 2024-06-25 ENCOUNTER — Encounter: Payer: Self-pay | Admitting: Physician Assistant

## 2024-06-25 DIAGNOSIS — T148XXA Other injury of unspecified body region, initial encounter: Secondary | ICD-10-CM | POA: Diagnosis not present

## 2024-06-25 NOTE — Progress Notes (Signed)
 Office Visit Note   Patient: Julie [name]           Date of Birth: 12/05/1940           MRN: [id number] Visit Date: 06/25/2024              Requested by: Larnell Hamilton, MD [address] [place],  [place] [address] PCP: Larnell Hamilton, MD  Chief Complaint  Patient presents with   Left Leg - Wound Check      HPI: 83 y/o female who presented to the ED in [place]  after a mechanical fall.  She was taken to the OR for irrigation and vac placement.  She is here for follow up.  Possible vac re application.  The vac was removed on her last visit and she started wet to dry Vashe dressing changes daiy. Shower with soap and water daily as needed. WBAT. Elevation when at rest. She was given a handout on elevation above the level of her heart.   She has done well over with the daily elevation.  The wound has been to painful and they have soaked the guaze off instead of pulling it off.                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  Assessment & Plan: Visit Diagnoses: No diagnosis found.  Plan: She was instructed on wet to dry Vashe dressing changes daiy. Shower with soap and water daily as needed. WBAT. Elevation above the heart when at rest.  We will try to get insurance to approve for [facility] application and the Isocod study.    Follow-Up Instructions: Return in about 1 week (around 07/02/2024).   Ortho Exam  Patient is alert, oriented, no adenopathy, well-dressed, normal affect, normal respiratory effort. Positive edema in the right LE.  Palpable pedal pulse.  Yellow fibrinous tissue was debrided from the wound with 4 x 4 and Vashe.  85 % granulation base.  No cellulitis or active drainage.  The proximal wound measures 7 cm x 4.5 cm and the distal wound measures 7.7 cm x 3.5 cm.  Distal wound has adhered black eschar from dying skin edges.      Imaging: No results found. No images are  attached to the encounter.  Labs: Lab Results  Component Value Date   HGBA1C 5.7 (H) 10/05/2015   ESRSEDRATE 14 10/05/2015     Lab Results  Component Value Date   ALBUMIN  4.0 11/14/2023   ALBUMIN  4.4 08/11/2023   ALBUMIN  2.8 (L) 06/30/2023    No results found for: MG No results found for: VD25OH  No results found for: PREALBUMIN    Latest Ref Rng & Units 11/14/2023   10:38 AM 08/11/2023   11:58 AM 07/02/2023    3:23 AM  CBC EXTENDED  WBC 4.0 - 10.5 K/uL 7.1  6.1  5.6   RBC 3.87 - 5.11 MIL/uL 5.05  4.81  4.03   Hemoglobin 12.0 - 15.0 g/dL 84.1  84.2  86.5   HCT 36.0 - 46.0 % 46.9  46.5  38.9   Platelets 150 - 400 K/uL 271  328  149   NEUT# 1.7 - 7.7 K/uL 4.3  3.6    Lymph# 0.7 - 4.0 K/uL 2.1  1.9       There is no height or weight on file to calculate BMI.  Orders:  No orders of the defined types were placed in  this encounter.  No orders of the defined types were placed in this encounter.    Procedures: No procedures performed  Clinical Data: No additional findings.  ROS:  All other systems negative, except as noted in the HPI. Review of Systems  Objective: Vital Signs: There were no vitals taken for this visit.  Specialty Comments:  No specialty comments available.  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/06/2019   Pre-operative clearance 07/31/2019   Arthritis of right hip 07/16/2019   Right hip pain 06/27/2019   CAD in native artery 06/06/2017   Dyslipidemia, goal LDL below 70 05/11/2017   CMC arthritis,  thumb, 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   Closed fracture of middle third of scaphoid bone of left wrist 03/31/2016   Lateral meniscal tear 01/25/2016   Patellofemoral arthralgia of right knee 01/25/2016   History of TIA (transient ischemic attack) 10/05/2015   Polycythemia 10/05/2015   Diplopia 10/05/2015   Thyroid  disease    Celiac disease    Past Medical History:  Diagnosis Date   Arthritis    Asthma    CAD in native artery 06/06/2017   Mild LAD disease on coronary CT-A 05/2017.   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 Verdel)   Statin myopathy 09/15/2020   Thyroid  disease    TIA (transient ischemic attack)    2017   Transaminitis 09/15/2020    Family History  Problem Relation Age of Onset   Peripheral Artery Disease Mother    Dementia Father     Past Surgical History:  Procedure Laterality Date   ABDOMINAL HYSTERECTOMY     BLADDER SURGERY     hole in bladder from hysterectomy   BREAST SURGERY     biopsy breast benign   EYE SURGERY     bil cataract   TOTAL HIP ARTHROPLASTY Right 08/06/2019   Procedure: RIGHT TOTAL HIP ARTHROPLASTY ANTERIOR APPROACH;  Surgeon: Sheril Coy, MD;  Location: [facility];  Service: Orthopedics;  Laterality: Right;   TOTAL THYROIDECTOMY     Social History   Occupational History   Not on file  Tobacco Use   Smoking status: Never   Smokeless tobacco: Never  Vaping Use   Vaping status: Never Used  Substance and Sexual Activity   Alcohol use: Yes    Comment: 12/02/21 3 daily   Drug use: No   Sexual activity: Not Currently

## 2024-06-27 ENCOUNTER — Ambulatory Visit (HOSPITAL_BASED_OUTPATIENT_CLINIC_OR_DEPARTMENT_OTHER): Admitting: General Surgery

## 2024-07-02 ENCOUNTER — Ambulatory Visit (INDEPENDENT_AMBULATORY_CARE_PROVIDER_SITE_OTHER): Admitting: Physician Assistant

## 2024-07-02 ENCOUNTER — Encounter: Payer: Self-pay | Admitting: Physician Assistant

## 2024-07-02 DIAGNOSIS — T148XXA Other injury of unspecified body region, initial encounter: Secondary | ICD-10-CM | POA: Diagnosis not present

## 2024-07-02 MED ORDER — OXYCODONE-ACETAMINOPHEN 5-325 MG PO TABS: 1.0000 | ORAL_TABLET | Freq: Four times a day (QID) | ORAL | 0 refills | Status: AC | PRN

## 2024-07-02 NOTE — Progress Notes (Signed)
 Office Visit Note   Patient: Julie [name]           Date of Birth: 1941-05-17           MRN: [id number] Visit Date: 07/02/2024              Requested by: Larnell Hamilton, MD [address] [place],  [place] [address] PCP: Larnell Hamilton, MD  Chief Complaint  Patient presents with   Left Leg - Wound Check      HPI: 83 y/o female with right anterior leg injury after a mechanical fall causing skin avulsion.  She was last seen on 06/25/24 and the wounds measured proximal wound measures 7 cm x 4.5 cm and the distal wound measures 7.7 cm x 3.5 cm. Distal wound has adhered black eschar from dying skin edges.    With her sister's help they have been showering with soap and water, wet to dry Vashe dressing changes.  They have soaked the dry guaze off in the shower.  She continues to have increase pain with dressing changes and uses PRN pain medication 1-2 ties a day.    Assessment & Plan: Visit Diagnoses:  1. Avulsion of skin     Plan: She was instructed on wet to dry Vashe dressing changes daiy. Shower with soap and water daily as needed. WBAT. Elevation above the heart when at rest.  We will try to get insurance to approve for [facility] application and the Isocod study.    Follow-Up Instructions: Return in about 1 week (around 07/09/2024).   Ortho Exam  Patient is alert, oriented, no adenopathy, well-dressed, normal affect, normal respiratory effort. Palpable DP pulse, mild edema in the ankle and foot.  Good skin lines in the leg.  Over all it appears that she has decreased edema.   The proximal wound measures diagonally 8.5 cm  cm x 4 cm and the distal wound measures 6 cm  cm x 3.5 cm.  Yellow fibrinous tissue was debrided from the wounds.   No cellulitis or purulent drainage.       Imaging: No results found. No images are attached to the encounter.  Labs: Lab Results  Component Value Date   HGBA1C 5.7 (H) 10/05/2015   ESRSEDRATE 14 10/05/2015     Lab Results  Component  Value Date   ALBUMIN  4.0 11/14/2023   ALBUMIN  4.4 08/11/2023   ALBUMIN  2.8 (L) 06/30/2023    No results found for: MG No results found for: VD25OH  No results found for: PREALBUMIN    Latest Ref Rng & Units 11/14/2023   10:38 AM 08/11/2023   11:58 AM 07/02/2023    3:23 AM  CBC EXTENDED  WBC 4.0 - 10.5 K/uL 7.1  6.1  5.6   RBC 3.87 - 5.11 MIL/uL 5.05  4.81  4.03   Hemoglobin 12.0 - 15.0 g/dL 84.1  84.2  86.5   HCT 36.0 - 46.0 % 46.9  46.5  38.9   Platelets 150 - 400 K/uL 271  328  149   NEUT# 1.7 - 7.7 K/uL 4.3  3.6    Lymph# 0.7 - 4.0 K/uL 2.1  1.9       There is no height or weight on file to calculate BMI.  Orders:  No orders of the defined types were placed in this encounter.  Meds ordered this encounter  Medications   oxyCODONE -acetaminophen  (PERCOCET/ROXICET) 5-325 MG tablet    Sig: Take 1 tablet by mouth every 6 (six) hours as  needed.    Dispense:  30 tablet    Refill:  0    Supervising Provider:   DUDA, MARCUS V [[id number]]     Procedures: No procedures performed  Clinical Data: No additional findings.  ROS:  All other systems negative, except as noted in the HPI. Review of Systems  Objective: Vital Signs: There were no vitals taken for this visit.  Specialty Comments:  No specialty comments available.  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/06/2019   Pre-operative clearance 07/31/2019   Arthritis of right hip 07/16/2019   Right hip pain 06/27/2019   CAD in native artery 06/06/2017   Dyslipidemia, goal LDL  below 70 05/11/2017   CMC arthritis, thumb, 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   Closed fracture of middle third of scaphoid bone of left wrist 03/31/2016   Lateral meniscal tear 01/25/2016   Patellofemoral arthralgia of right knee 01/25/2016   History of TIA (transient ischemic attack) 10/05/2015   Polycythemia 10/05/2015   Diplopia 10/05/2015   Thyroid  disease    Celiac disease    Past Medical History:  Diagnosis Date   Arthritis    Asthma    CAD in native artery 06/06/2017   Mild LAD disease on coronary CT-A 05/2017.   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 Verdel)   Statin myopathy 09/15/2020   Thyroid  disease    TIA (transient ischemic attack)    2017   Transaminitis 09/15/2020    Family History  Problem Relation Age of Onset   Peripheral Artery Disease Mother    Dementia Father     Past Surgical History:  Procedure Laterality Date   ABDOMINAL HYSTERECTOMY     BLADDER SURGERY     hole in bladder from hysterectomy   BREAST SURGERY     biopsy breast benign   EYE SURGERY     bil cataract   TOTAL HIP ARTHROPLASTY Right 08/06/2019   Procedure: RIGHT TOTAL HIP ARTHROPLASTY ANTERIOR APPROACH;  Surgeon: Sheril Coy, MD;  Location: [facility];  Service: Orthopedics;  Laterality: Right;   TOTAL THYROIDECTOMY     Social History   Occupational History   Not on file  Tobacco Use   Smoking status: Never   Smokeless tobacco: Never  Vaping Use   Vaping status: Never Used  Substance and Sexual Activity   Alcohol use: Yes    Comment: 12/02/21 3 daily   Drug use: No   Sexual activity: Not Currently

## 2024-07-08 ENCOUNTER — Encounter: Payer: Self-pay | Admitting: Radiology

## 2024-07-09 ENCOUNTER — Encounter: Payer: Self-pay | Admitting: Physician Assistant

## 2024-07-09 ENCOUNTER — Ambulatory Visit: Admitting: Physician Assistant

## 2024-07-09 DIAGNOSIS — T148XXA Other injury of unspecified body region, initial encounter: Secondary | ICD-10-CM | POA: Diagnosis not present

## 2024-07-09 NOTE — Progress Notes (Addendum)
 Office Visit Note   Patient: Julie [name]           Date of Birth: 07/20/1941           MRN: [id number] Visit Date: 07/09/2024              Requested by: Larnell Hamilton, MD [address] [place],  [place] [address] PCP: Larnell Hamilton, MD  Chief Complaint  Patient presents with   Left Leg - Follow-up      HPI: 83 y/o female with right anterior leg injury after a mechanical fall causing skin avulsion.  Her partner is helping with dressing changes daily wet to dry Vashe.  On her last visit 07/02/24 the proximal wound measures diagonally 8.5 cm  cm x 4 cm and the distal wound measures 6 cm  cm x 3.5 cm.    Assessment & Plan: Visit Diagnoses:  1. Avulsion of skin     Plan: Donated Kerecis was applied to the covered with Adaptic.  Dry dressing applied.  They will change the dry dressing daily and leave the adaptic and Kerecis in place.    Follow-Up Instructions: Return in about 1 week (around 07/16/2024).   Ortho Exam  Patient is alert, oriented, no adenopathy, well-dressed, normal affect, normal respiratory effort. Minimal edema, palpable DP pulse.    The proximal wound measures diagonally 5 cm  cm x 2.8 cm and the distal wound measures 6 cm  cm x 3.5 cm.  Yellow fibrinous tissue was debrided from the wounds.   No cellulitis or purulent drainage.  After verbal consent I debrided the yellow fibrinous tissue with a 10 blade.          Imaging: No results found. No images are attached to the encounter.  Labs: Lab Results  Component Value Date   HGBA1C 5.7 (H) 10/05/2015   ESRSEDRATE 14 10/05/2015     Lab Results  Component Value Date   ALBUMIN  4.0 11/14/2023   ALBUMIN  4.4 08/11/2023   ALBUMIN  2.8 (L) 06/30/2023    No results found for: MG No results found for: VD25OH  No results found for: PREALBUMIN    Latest Ref Rng & Units 11/14/2023   10:38 AM 08/11/2023   11:58 AM 07/02/2023    3:23 AM  CBC EXTENDED  WBC 4.0 - 10.5 K/uL 7.1  6.1  5.6    RBC 3.87 - 5.11 MIL/uL 5.05  4.81  4.03   Hemoglobin 12.0 - 15.0 g/dL 84.1  84.2  86.5   HCT 36.0 - 46.0 % 46.9  46.5  38.9   Platelets 150 - 400 K/uL 271  328  149   NEUT# 1.7 - 7.7 K/uL 4.3  3.6    Lymph# 0.7 - 4.0 K/uL 2.1  1.9       There is no height or weight on file to calculate BMI.  Orders:  No orders of the defined types were placed in this encounter.  No orders of the defined types were placed in this encounter.    Procedures: No procedures performed  Clinical Data: No additional findings.  ROS:  All other systems negative, except as noted in the HPI. Review of Systems  Objective: Vital Signs: There were no vitals taken for this visit.  Specialty Comments:  No specialty comments available.  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 05/23/2023   Osteopenia 12/29/2021  Degenerative lumbar spinal stenosis 12/29/2021   Memory loss 10/15/2021   Statin myopathy 09/15/2020   Transaminitis 09/15/2020   Degenerative arthritis of knee, bilateral 05/11/2020   Primary localized osteoarthritis of right hip 08/06/2019   Primary osteoarthritis of right hip 08/06/2019   Pre-operative clearance 07/31/2019   Arthritis of right hip 07/16/2019   Right hip pain 06/27/2019   CAD in native artery 06/06/2017   Dyslipidemia, goal LDL below 70 05/11/2017   CMC arthritis, thumb, 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   Closed fracture of middle third of scaphoid bone of left wrist 03/31/2016   Lateral meniscal tear 01/25/2016   Patellofemoral arthralgia of right knee 01/25/2016   History of TIA (transient ischemic attack) 10/05/2015   Polycythemia 10/05/2015   Diplopia 10/05/2015   Thyroid  disease    Celiac disease    Past Medical History:  Diagnosis Date   Arthritis    Asthma    CAD in native artery 06/06/2017   Mild LAD disease on coronary CT-A 05/2017.   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 Verdel)   Statin myopathy 09/15/2020   Thyroid  disease    TIA (transient ischemic attack)    2017   Transaminitis 09/15/2020    Family History  Problem Relation Age of Onset   Peripheral Artery Disease Mother    Dementia Father     Past Surgical History:  Procedure Laterality Date   ABDOMINAL HYSTERECTOMY     BLADDER SURGERY     hole in bladder from hysterectomy   BREAST SURGERY     biopsy breast benign   EYE SURGERY     bil cataract   TOTAL HIP ARTHROPLASTY Right 08/06/2019   Procedure: RIGHT TOTAL HIP ARTHROPLASTY ANTERIOR APPROACH;  Surgeon: Sheril Coy, MD;  Location: [facility];  Service: Orthopedics;  Laterality: Right;   TOTAL THYROIDECTOMY     Social History   Occupational History   Not on file  Tobacco Use   Smoking status: Never   Smokeless tobacco: Never  Vaping Use   Vaping status: Never Used  Substance and Sexual Activity   Alcohol use: Yes    Comment: 12/02/21 3 daily   Drug use: No   Sexual activity: Not Currently

## 2024-07-16 ENCOUNTER — Encounter: Payer: Self-pay | Admitting: Physician Assistant

## 2024-07-16 ENCOUNTER — Ambulatory Visit (INDEPENDENT_AMBULATORY_CARE_PROVIDER_SITE_OTHER): Admitting: Physician Assistant

## 2024-07-16 DIAGNOSIS — S8991XA Unspecified injury of right lower leg, initial encounter: Secondary | ICD-10-CM

## 2024-07-16 DIAGNOSIS — T148XXA Other injury of unspecified body region, initial encounter: Secondary | ICD-10-CM

## 2024-07-16 NOTE — Progress Notes (Signed)
 Office Visit Note   Patient: Julie [name]           Date of Birth: [birth year]           MRN: [id number] Visit Date: 07/16/2024              Requested by: Larnell Hamilton, MD [address] [place],  [place] [address] PCP: Larnell Hamilton, MD  Chief Complaint  Patient presents with   Left Leg - Follow-up      HPI: 83 y/o female with right anterior leg injury after a mechanical fall causing skin avulsion.  The proximal wound measures diagonally 5 cm  cm x 2.8 cm and the distal wound measures 6 cm  cm x 3.5 cm.  Assessment & Plan: Visit Diagnoses:  1. Avulsion of skin     Plan: Will apply donated Kerecis tissue graft and she will continue with Dry dressing changes daily over the adaptic.    Follow-Up Instructions: Return in about 1 week (around 07/23/2024).   Ortho Exam  Patient is alert, oriented, no adenopathy, well-dressed, normal affect, normal respiratory effort. Debridement with 4 x 4 and Vashe to healthy bleeding tissue.  The proximal wound is 2.5 cm x 1.7 cm 100% granulation tissue and the distal wound is 7 cm x 1.7 cm with 75 % granulation tissue.  Mild edema in the ankle and foot when dependent.       Imaging: No results found.      Labs: Lab Results  Component Value Date   HGBA1C 5.7 (H) 10/05/2015   ESRSEDRATE 14 10/05/2015     Lab Results  Component Value Date   ALBUMIN  4.0 11/14/2023   ALBUMIN  4.4 08/11/2023   ALBUMIN  2.8 (L) 06/30/2023    No results found for: MG No results found for: VD25OH  No results found for: PREALBUMIN    Latest Ref Rng & Units 11/14/2023   10:38 AM 08/11/2023   11:58 AM 07/02/2023    3:23 AM  CBC EXTENDED  WBC 4.0 - 10.5 K/uL 7.1  6.1  5.6   RBC 3.87 - 5.11 MIL/uL 5.05  4.81  4.03   Hemoglobin 12.0 - 15.0 g/dL 84.1  84.2  86.5   HCT 36.0 - 46.0 % 46.9  46.5  38.9   Platelets 150 - 400 K/uL 271  328  149   NEUT# 1.7 - 7.7 K/uL 4.3  3.6    Lymph# 0.7 - 4.0 K/uL 2.1  1.9       There is no height or  weight on file to calculate BMI.  Orders:  No orders of the defined types were placed in this encounter.  No orders of the defined types were placed in this encounter.    Procedures: No procedures performed  Clinical Data: No additional findings.  ROS:  All other systems negative, except as noted in the HPI. Review of Systems  Objective: Vital Signs: There were no vitals taken for this visit.  Specialty Comments:  No specialty comments available.  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/06/2019   Pre-operative clearance  07/31/2019   Arthritis of right hip 07/16/2019   Right hip pain 06/27/2019   CAD in native artery 06/06/2017   Dyslipidemia, goal LDL below 70 05/11/2017   CMC arthritis, thumb, 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   Closed fracture of middle third of scaphoid bone of left wrist 03/31/2016   Lateral meniscal tear 01/25/2016   Patellofemoral arthralgia of right knee 01/25/2016   History of TIA (transient ischemic attack) 10/05/2015   Polycythemia 10/05/2015   Diplopia 10/05/2015   Thyroid  disease    Celiac disease    Past Medical History:  Diagnosis Date   Arthritis    Asthma    CAD in native artery 06/06/2017   Mild LAD disease on coronary CT-A 05/2017.   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 Verdel)   Statin myopathy 09/15/2020   Thyroid  disease    TIA (transient ischemic attack)    2017   Transaminitis 09/15/2020    Family History  Problem Relation Age of Onset   Peripheral Artery Disease Mother    Dementia Father     Past Surgical History:  Procedure Laterality Date   ABDOMINAL HYSTERECTOMY     BLADDER SURGERY     hole in bladder from hysterectomy   BREAST SURGERY     biopsy breast benign   EYE SURGERY     bil cataract   TOTAL HIP ARTHROPLASTY Right 08/06/2019   Procedure: RIGHT TOTAL HIP ARTHROPLASTY ANTERIOR APPROACH;  Surgeon: Sheril Coy, MD;  Location: [facility];  Service: Orthopedics;  Laterality: Right;   TOTAL THYROIDECTOMY     Social History   Occupational History   Not on file  Tobacco Use   Smoking status: Never   Smokeless tobacco: Never  Vaping Use   Vaping status: Never Used  Substance and Sexual Activity   Alcohol use: Yes    Comment: 12/02/21 3 daily   Drug use: No   Sexual activity: Not Currently

## 2024-07-23 ENCOUNTER — Encounter: Payer: Self-pay | Admitting: Physician Assistant

## 2024-07-23 ENCOUNTER — Ambulatory Visit (INDEPENDENT_AMBULATORY_CARE_PROVIDER_SITE_OTHER): Admitting: Physician Assistant

## 2024-07-23 DIAGNOSIS — T148XXA Other injury of unspecified body region, initial encounter: Secondary | ICD-10-CM | POA: Diagnosis not present

## 2024-07-23 NOTE — Progress Notes (Unsigned)
 [facility] [address] [place] [address] Phone: [phone] Subjective:   Julie [name], am serving as a scribe for Dr. Arthea Claudene.  I'm seeing this patient by the request  of:  Larnell Hamilton, MD  CC: Knee pain and back pain   YEP:Dlagzrupcz  04/02/2024 Difficult to assess.  Do believe that it is more secondary to a potential tendon injury.  Discussed icing regimen of home exercises.  Discussed which activities to do and which ones to avoid.  Given a injection to help with patient going out of town and a short course of prednisone .  Patient is to monitor for any type of bruising with patient being on Eliquis .  Differential includes lumbar radiculopathy and may need to consider the possibility of a lumbar epidural if continuing to have difficulty.  Has known L4 nerve impingement.  Follow-up again 3 months     Right knee given injection again.  Discussed icing regimen of home exercises.  Discussed which activities to do in which ones to avoid.  Increase activity slowly.  Patient has responded well to every about 10 to 12 weeks.  We discussed further evaluation in 3 months continue to wear the custom bracing with activities     Update 07/24/2024 Julie [name] is a 83 y.o. female coming in with complaint of B knee, lumbar spine and R hip pain. Patient slipped in early October causing 2 avulsions of skin on R tibia. Patient states that her R knee pain worsened after the fall and how she has to lay to stay comfortable due to abrasions.   Hip and back are painful and sore due to having to lie down from the the leg abrasions.       Past Medical History:  Diagnosis Date   Arthritis    Asthma    CAD in native artery 06/06/2017   Mild LAD disease on coronary CT-A 05/2017.   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 Verdel)   Statin myopathy 09/15/2020   Thyroid  disease    TIA (transient ischemic attack)    2017   Transaminitis 09/15/2020   Past Surgical History:  Procedure Laterality Date   ABDOMINAL HYSTERECTOMY     BLADDER SURGERY     hole in bladder from hysterectomy   BREAST SURGERY     biopsy breast benign   EYE SURGERY     bil cataract   TOTAL HIP ARTHROPLASTY Right 08/06/2019   Procedure: RIGHT TOTAL HIP ARTHROPLASTY ANTERIOR APPROACH;  Surgeon: Sheril Coy, MD;  Location: [facility];  Service: Orthopedics;  Laterality: Right;   TOTAL THYROIDECTOMY     Social History   Socioeconomic History   Marital status: Media Planner    Spouse name: Niels   Number of children: Not on file   Years of education: Not on file   Highest education level: Master's degree (e.g., MA, MS, MEng, MEd, MSW, MBA)  Occupational History   Not on file  Tobacco Use   Smoking status: Never   Smokeless tobacco: Never  Vaping Use   Vaping status: Never Used  Substance and Sexual Activity   Alcohol use: Yes    Comment: 12/02/21 3  daily   Drug use: No   Sexual activity: Not Currently  Other Topics Concern   Not on file  Social History Narrative   Lives with partner   Social Drivers of Health   Financial Resource Strain: Not on file  Food Insecurity: No Food Insecurity (06/09/2024)   Received from [facility]   Hunger Vital Sign    Within the past 12 months, you worried that your food would run out before you got the money to buy more.: Never true    Within the past 12 months, the food you bought just didn't last and you didn't have money to get more.: Never true  Transportation Needs: No Transportation Needs (06/09/2024)   Received from [facility] - Transportation    In the past 12 months, has lack of transportation kept you from medical appointments or from  getting medications?: No    In the past 12 months, has lack of transportation kept you from meetings, work, or from getting things needed for daily living?: No  Physical Activity: Not on file  Stress: Not on file  Social Connections: Not on file   Allergies  Allergen Reactions   Losartan  Anaphylaxis    Diffuse hives and tongue swelling   Propantheline Rash    Cannot remember other symptoms    Atorvastatin      Myalgia    Gluten Meal Diarrhea    bloating   Rosuvastatin     Sulfa Antibiotics Swelling   Zetia  [Ezetimibe ]     Myalgias    Demerol [Meperidine] Rash   Morphine And Codeine Rash   Family History  Problem Relation Age of Onset   Peripheral Artery Disease Mother    Dementia Father     Current Outpatient Medications (Endocrine & Metabolic):    levothyroxine  (SYNTHROID ) 125 MCG tablet, Take 125 mcg by mouth daily before breakfast.   predniSONE  (DELTASONE ) 20 MG tablet, Take 1 tablet (20 mg total) by mouth daily with breakfast.   Current Outpatient Medications (Respiratory):    diphenhydrAMINE  (BENADRYL ) 25 MG tablet, Take 1 tablet (25 mg total) by mouth every 8 (eight) hours as needed for up to 21 doses for allergies or sleep.  Current Outpatient Medications (Analgesics):    oxyCODONE -acetaminophen  (PERCOCET/ROXICET) 5-325 MG tablet, Take 1 tablet by mouth every 6 (six) hours as needed.   oxyCODONE -acetaminophen  (PERCOCET/ROXICET) 5-325 MG tablet, Take 1 tablet by mouth every 6 (six) hours as needed.  Current Outpatient Medications (Hematological):    APIXABAN  (ELIQUIS ) VTE STARTER PACK (10MG  AND 5MG ), Take as directed on package: start with two-5mg  tablets twice daily for 7 days. On day 8, switch to one-5mg  tablet twice daily.  Current Outpatient Medications (Other):    cholecalciferol (VITAMIN D ) 1000 units tablet, Take 1,000 Units by mouth daily.   Multiple Vitamins-Minerals (PRESERVISION AREDS 2 PO), Take 1 tablet by mouth daily.   Multiple Vitamins-Minerals  (MULTIVITAMIN WITH MINERALS) tablet, Take 1 tablet by mouth daily.   Reviewed prior external information including notes and imaging from  primary care provider As well as notes that were available from care everywhere and other healthcare systems.  Past medical history, social, surgical and family history all reviewed in electronic medical record.  No pertanent information unless stated regarding to the chief complaint.   Review of Systems:  No headache, visual changes, nausea, vomiting, diarrhea, constipation, dizziness, abdominal pain, skin rash, fevers, chills, night sweats, weight loss, swollen lymph nodes, body aches, joint swelling, chest pain, shortness of  breath, mood changes. POSITIVE muscle aches  Objective  Blood pressure 126/82, height 5' 5 (1.651 m).   General: No apparent distress alert and oriented x3 mood and affect normal, dressed appropriately.  HEENT: Pupils equal, extraocular movements intact  Respiratory: Patient's speak in full sentences and does not appear short of breath  Lower extremity on the right side does have significant amount of swelling noted.  Patient does have a bandage on lower extremity. Severely antalgic gait noted. Patient's right knee and leg area knee does have some instability noted.  Lacks last 10 degrees of flexion  After informed written and verbal consent, patient was seated on exam table. Right knee was prepped with alcohol swab and utilizing anterolateral approach, patient's right knee space was injected with 4:1  marcaine  0.5%: Kenalog  40mg /dL. Patient tolerated the procedure well without immediate complications.  After verbal consent patient was prepped with alcohol swab and with a 21-gauge 2 inch needle injected into the right sacroiliac joint area with a total of 0.5 cc of 0.5% Marcaine  and 0.5 cc of Kenalog  40 mg/mL.  No blood loss, Band-Aid placed.  Postinjection instruction given     Impression and Recommendations:    The above  documentation has been reviewed and is accurate and complete Deegan Valentino M Helene Bernstein, DO

## 2024-07-23 NOTE — Progress Notes (Signed)
 Office Visit Note   Patient: Julie [name]           Date of Birth: Jan 06, 1941           MRN: [id number] Visit Date: 07/23/2024              Requested by: Larnell Hamilton, MD [address] [place],  [place] [address] PCP: Larnell Hamilton, MD  Chief Complaint  Patient presents with   Left Leg - Follow-up      HPI: 83 y/o female with right anterior leg injury after a mechanical fall causing skin avulsion. She has received Kerecis donation.  She is very pleased that her wounds seem to be healing.    Assessment & Plan: Visit Diagnoses:  1. Avulsion of skin     Plan: We will do Vashe wet to dry with ace compression wraps.  Elevation to continue to decrease  swelling.    Follow-Up Instructions: Return in about 1 week (around 07/30/2024).   Ortho Exam  Patient is alert, oriented, no adenopathy, well-dressed, normal affect, normal respiratory effort. The proximal anterior wound 2 x 1 cm with 100 % granulation.  The distal wound is 2 cm x 7 cm.  There seems to be new skin island formation lateral wound edges.  She has moderate edema in the foot, but has had to be in a dependent position for a while today.      Imaging: No results found.       Labs: Lab Results  Component Value Date   HGBA1C 5.7 (H) 10/05/2015   ESRSEDRATE 14 10/05/2015     Lab Results  Component Value Date   ALBUMIN  4.0 11/14/2023   ALBUMIN  4.4 08/11/2023   ALBUMIN  2.8 (L) 06/30/2023    No results found for: MG No results found for: VD25OH  No results found for: PREALBUMIN    Latest Ref Rng & Units 11/14/2023   10:38 AM 08/11/2023   11:58 AM 07/02/2023    3:23 AM  CBC EXTENDED  WBC 4.0 - 10.5 K/uL 7.1  6.1  5.6   RBC 3.87 - 5.11 MIL/uL 5.05  4.81  4.03   Hemoglobin 12.0 - 15.0 g/dL 84.1  84.2  86.5   HCT 36.0 - 46.0 % 46.9  46.5  38.9   Platelets 150 - 400 K/uL 271  328  149   NEUT# 1.7 - 7.7 K/uL 4.3  3.6    Lymph# 0.7 - 4.0 K/uL 2.1  1.9       There is no height or weight  on file to calculate BMI.  Orders:  No orders of the defined types were placed in this encounter.  No orders of the defined types were placed in this encounter.    Procedures: No procedures performed  Clinical Data: No additional findings.  ROS:  All other systems negative, except as noted in the HPI. Review of Systems  Objective: Vital Signs: There were no vitals taken for this visit.  Specialty Comments:  No specialty comments available.  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/06/2019   Pre-operative  clearance 07/31/2019   Arthritis of right hip 07/16/2019   Right hip pain 06/27/2019   CAD in native artery 06/06/2017   Dyslipidemia, goal LDL below 70 05/11/2017   CMC arthritis, thumb, 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   Closed fracture of middle third of scaphoid bone of left wrist 03/31/2016   Lateral meniscal tear 01/25/2016   Patellofemoral arthralgia of right knee 01/25/2016   History of TIA (transient ischemic attack) 10/05/2015   Polycythemia 10/05/2015   Diplopia 10/05/2015   Thyroid  disease    Celiac disease    Past Medical History:  Diagnosis Date   Arthritis    Asthma    CAD in native artery 06/06/2017   Mild LAD disease on coronary CT-A 05/2017.   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  Verdel)   Statin myopathy 09/15/2020   Thyroid  disease    TIA (transient ischemic attack)    2017   Transaminitis 09/15/2020    Family History  Problem Relation Age of Onset   Peripheral Artery Disease Mother    Dementia Father     Past Surgical History:  Procedure Laterality Date   ABDOMINAL HYSTERECTOMY     BLADDER SURGERY     hole in bladder from hysterectomy   BREAST SURGERY     biopsy breast benign   EYE SURGERY     bil cataract   TOTAL HIP ARTHROPLASTY Right 08/06/2019   Procedure: RIGHT TOTAL HIP ARTHROPLASTY ANTERIOR APPROACH;  Surgeon: Sheril Coy, MD;  Location: [facility];  Service: Orthopedics;  Laterality: Right;   TOTAL THYROIDECTOMY     Social History   Occupational History   Not on file  Tobacco Use   Smoking status: Never   Smokeless tobacco: Never  Vaping Use   Vaping status: Never Used  Substance and Sexual Activity   Alcohol use: Yes    Comment: 12/02/21 3 daily   Drug use: No   Sexual activity: Not Currently

## 2024-07-24 ENCOUNTER — Ambulatory Visit (INDEPENDENT_AMBULATORY_CARE_PROVIDER_SITE_OTHER): Admitting: Family Medicine

## 2024-07-24 ENCOUNTER — Encounter: Payer: Self-pay | Admitting: Family Medicine

## 2024-07-24 VITALS — BP 126/82 | Ht 65.0 in

## 2024-07-24 DIAGNOSIS — M461 Sacroiliitis, not elsewhere classified: Secondary | ICD-10-CM

## 2024-07-24 DIAGNOSIS — M170 Bilateral primary osteoarthritis of knee: Secondary | ICD-10-CM

## 2024-07-24 NOTE — Assessment & Plan Note (Signed)
 Chronic problem with exacerbation.  Discussed icing regimen and home exercises, has responded well to the injections previously.  Increase activity slowly.  Discussed icing regimen.  Follow-up again in 6 to 12 weeks.

## 2024-07-24 NOTE — Patient Instructions (Addendum)
 Injected R knee and R SI joint See me in 10 weeks

## 2024-07-24 NOTE — Assessment & Plan Note (Signed)
 Right sided injecting, discussed with patient to monitor the swelling in the lower extremity.  Patient is seeing wound care still at this time.  Follow-up with me again in 6 to 12 weeks

## 2024-07-30 ENCOUNTER — Encounter: Payer: Self-pay | Admitting: Physician Assistant

## 2024-07-30 ENCOUNTER — Ambulatory Visit: Admitting: Physician Assistant

## 2024-07-30 DIAGNOSIS — T148XXA Other injury of unspecified body region, initial encounter: Secondary | ICD-10-CM

## 2024-07-30 NOTE — Progress Notes (Signed)
 Office Visit Note   Patient: Julie [name]           Date of Birth: 26-Oct-1940           MRN: [id number] Visit Date: 07/30/2024              Requested by: Larnell Hamilton, MD [address] [place],  [place] [address] PCP: Larnell Hamilton, MD  Chief Complaint  Patient presents with   Left Leg - Follow-up      HPI: 83 y/o female with right anterior leg injury after a mechanical fall causing skin avulsion. She has received Kerecis donation.  She is very pleased that her wounds seem to be healing.  Over the last week they have performing Vashe wet to dry dressing and washing with soap and water. She has started walking more on a daily basis for exercise.    Assessment & Plan: Visit Diagnoses:  1. Avulsion of skin     Plan: We will do Vashe wet to dry with ace compression wraps. Elevation to continue to decrease swelling.   Follow-Up Instructions: Return in about 3 weeks (around 08/20/2024).   Ortho Exam  Patient is alert, oriented, no adenopathy, well-dressed, normal affect, normal respiratory effort. The proximal and distal wounds are healing well with increase new skin.  No cellulitis or drainage.  Good skin lines.  Mild edema in the foot.  100 % granulation tissue in wound beds.     Imaging: No results found. No images are attached to the encounter.  Labs: Lab Results  Component Value Date   HGBA1C 5.7 (H) 10/05/2015   ESRSEDRATE 14 10/05/2015     Lab Results  Component Value Date   ALBUMIN  4.0 11/14/2023   ALBUMIN  4.4 08/11/2023   ALBUMIN  2.8 (L) 06/30/2023    No results found for: MG No results found for: VD25OH  No results found for: PREALBUMIN    Latest Ref Rng & Units 11/14/2023   10:38 AM 08/11/2023   11:58 AM 07/02/2023    3:23 AM  CBC EXTENDED  WBC 4.0 - 10.5 K/uL 7.1  6.1  5.6   RBC 3.87 - 5.11 MIL/uL 5.05  4.81  4.03   Hemoglobin 12.0 - 15.0 g/dL 84.1  84.2  86.5   HCT 36.0 - 46.0 % 46.9  46.5  38.9   Platelets 150 - 400 K/uL 271  328   149   NEUT# 1.7 - 7.7 K/uL 4.3  3.6    Lymph# 0.7 - 4.0 K/uL 2.1  1.9       There is no height or weight on file to calculate BMI.  Orders:  No orders of the defined types were placed in this encounter.  No orders of the defined types were placed in this encounter.    Procedures: No procedures performed  Clinical Data: No additional findings.  ROS:  All other systems negative, except as noted in the HPI. Review of Systems  Objective: Vital Signs: There were no vitals taken for this visit.  Specialty Comments:  No specialty comments available.  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   Memory loss 10/15/2021   Statin myopathy 09/15/2020   Transaminitis 09/15/2020   Degenerative arthritis of knee, bilateral 05/11/2020   Primary localized osteoarthritis of right hip 08/06/2019   Primary  osteoarthritis of right hip 08/06/2019   Pre-operative clearance 07/31/2019   Arthritis of right hip 07/16/2019   Right hip pain 06/27/2019   CAD in native artery 06/06/2017   Dyslipidemia, goal LDL below 70 05/11/2017   CMC arthritis, thumb, 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   Closed fracture of middle third of scaphoid bone of left wrist 03/31/2016   Lateral meniscal tear 01/25/2016   Patellofemoral arthralgia of right knee 01/25/2016   History of TIA (transient ischemic attack) 10/05/2015   Polycythemia 10/05/2015   Diplopia 10/05/2015   Thyroid  disease    Celiac disease    Past Medical History:  Diagnosis Date   Arthritis    Asthma    CAD in native artery 06/06/2017   Mild LAD disease on coronary CT-A 05/2017.   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 Verdel)   Statin myopathy 09/15/2020   Thyroid  disease    TIA (transient ischemic attack)    2017   Transaminitis 09/15/2020    Family History  Problem Relation Age of Onset   Peripheral Artery Disease Mother    Dementia Father     Past Surgical History:  Procedure Laterality Date   ABDOMINAL HYSTERECTOMY     BLADDER SURGERY     hole in bladder from hysterectomy   BREAST SURGERY     biopsy breast benign   EYE SURGERY     bil cataract   TOTAL HIP ARTHROPLASTY Right 08/06/2019   Procedure: RIGHT TOTAL HIP ARTHROPLASTY ANTERIOR APPROACH;  Surgeon: Sheril Coy, MD;  Location: [facility];  Service: Orthopedics;  Laterality: Right;   TOTAL THYROIDECTOMY     Social History   Occupational History   Not on file  Tobacco Use   Smoking status: Never   Smokeless tobacco: Never  Vaping Use   Vaping status: Never Used  Substance and Sexual Activity   Alcohol use: Yes    Comment: 12/02/21 3 daily   Drug use: No   Sexual activity: Not Currently

## 2024-08-22 ENCOUNTER — Encounter: Payer: Self-pay | Admitting: Physician Assistant

## 2024-08-22 ENCOUNTER — Ambulatory Visit: Admitting: Physician Assistant

## 2024-08-22 DIAGNOSIS — T148XXA Other injury of unspecified body region, initial encounter: Secondary | ICD-10-CM | POA: Diagnosis not present

## 2024-08-22 NOTE — Progress Notes (Signed)
 Office Visit Note   Patient: Julie [name]           Date of Birth: June 01, 1941           MRN: [id number] Visit Date: 08/22/2024              Requested by: Larnell Hamilton, MD [address] [place],  [place] [address] PCP: Larnell Hamilton, MD  Chief Complaint  Patient presents with   Left Leg - Wound Check      HPI: 83 y/o female with right anterior leg injury after a mechanical fall causing skin avulsion. She has received Kerecis donation. She is very pleased that her wounds seem to be healing.  They have been performing Vashe wet to dry with ace compression wraps. Elevation to continue to decrease swelling.  She has become more activity.  She is very pleased with the healing and has no new complaints.    Assessment & Plan: Visit Diagnoses:  1. Avulsion of skin     Plan: Continue Vashe wet to dry until the last wound has healed.  Activity as tolerates.  She wants to return to the pool for exercise, I suggested she wait until the wound is fully healed.    Follow-Up Instructions: Return if symptoms worsen or fail to improve.   Ortho Exam  Patient is alert, oriented, no adenopathy, well-dressed, normal affect, normal respiratory effort. The right distal anterior wound has a flat superficial wound that measures 1 cm x 3 mm.  No cellulitis or drainage.  Palpable DP pulse.  The rest of the leg has fully healed and she has no edema.      Imaging: No results found. No images are attached to the encounter.  Labs: Lab Results  Component Value Date   HGBA1C 5.7 (H) 10/05/2015   ESRSEDRATE 14 10/05/2015     Lab Results  Component Value Date   ALBUMIN  4.0 11/14/2023   ALBUMIN  4.4 08/11/2023   ALBUMIN  2.8 (L) 06/30/2023    No results found for: MG No results found for: VD25OH  No results found for: PREALBUMIN    Latest Ref Rng & Units 11/14/2023   10:38 AM 08/11/2023   11:58 AM 07/02/2023    3:23 AM  CBC EXTENDED  WBC 4.0 - 10.5 K/uL 7.1  6.1  5.6   RBC 3.87 -  5.11 MIL/uL 5.05  4.81  4.03   Hemoglobin 12.0 - 15.0 g/dL 84.1  84.2  86.5   HCT 36.0 - 46.0 % 46.9  46.5  38.9   Platelets 150 - 400 K/uL 271  328  149   NEUT# 1.7 - 7.7 K/uL 4.3  3.6    Lymph# 0.7 - 4.0 K/uL 2.1  1.9       There is no height or weight on file to calculate BMI.  Orders:  No orders of the defined types were placed in this encounter.  No orders of the defined types were placed in this encounter.    Procedures: No procedures performed  Clinical Data: No additional findings.  ROS:  All other systems negative, except as noted in the HPI. Review of Systems  Objective: Vital Signs: There were no vitals taken for this visit.  Specialty Comments:  No specialty comments available.  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  Memory loss 10/15/2021   Statin myopathy 09/15/2020   Transaminitis 09/15/2020   Degenerative arthritis of knee, bilateral 05/11/2020   Primary localized osteoarthritis of right hip 08/06/2019   Primary osteoarthritis of right hip 08/06/2019   Pre-operative clearance 07/31/2019   Arthritis of right hip 07/16/2019   Right hip pain 06/27/2019   CAD in native artery 06/06/2017   Dyslipidemia, goal LDL below 70 05/11/2017   CMC arthritis, thumb, 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   Closed fracture of middle third of scaphoid bone of left wrist 03/31/2016   Lateral meniscal tear 01/25/2016   Patellofemoral arthralgia of right knee 01/25/2016   History of TIA (transient ischemic attack) 10/05/2015   Polycythemia 10/05/2015   Diplopia 10/05/2015   Thyroid  disease    Celiac disease    Past Medical History:  Diagnosis Date   Arthritis    Asthma    CAD in native artery 06/06/2017   Mild LAD disease on coronary CT-A 05/2017.   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 Verdel)   Statin myopathy 09/15/2020   Thyroid  disease    TIA (transient ischemic attack)    2017   Transaminitis 09/15/2020    Family History  Problem Relation Age of Onset   Peripheral Artery Disease Mother    Dementia Father     Past Surgical History:  Procedure Laterality Date   ABDOMINAL HYSTERECTOMY     BLADDER SURGERY     hole in bladder from hysterectomy   BREAST SURGERY     biopsy breast benign   EYE SURGERY     bil cataract   TOTAL HIP ARTHROPLASTY Right 08/06/2019   Procedure: RIGHT TOTAL HIP ARTHROPLASTY ANTERIOR APPROACH;  Surgeon: Sheril Coy, MD;  Location: [facility];  Service: Orthopedics;  Laterality: Right;   TOTAL THYROIDECTOMY     Social History   Occupational History   Not on file  Tobacco Use   Smoking status: Never   Smokeless tobacco: Never  Vaping Use   Vaping status: Never Used  Substance and Sexual Activity   Alcohol use: Yes    Comment: 12/02/21 3 daily   Drug use: No   Sexual activity: Not Currently

## 2024-08-26 ENCOUNTER — Other Ambulatory Visit: Payer: Self-pay | Admitting: Internal Medicine

## 2024-08-26 DIAGNOSIS — Z1231 Encounter for screening mammogram for malignant neoplasm of breast: Secondary | ICD-10-CM

## 2024-09-10 ENCOUNTER — Other Ambulatory Visit (HOSPITAL_BASED_OUTPATIENT_CLINIC_OR_DEPARTMENT_OTHER)

## 2024-09-18 ENCOUNTER — Ambulatory Visit
Admission: RE | Admit: 2024-09-18 | Discharge: 2024-09-18 | Disposition: A | Discharge status: Home / Self Care | Source: Ambulatory Visit | Attending: Internal Medicine | Admitting: Internal Medicine

## 2024-09-18 DIAGNOSIS — Z1231 Encounter for screening mammogram for malignant neoplasm of breast: Secondary | ICD-10-CM

## 2024-10-01 NOTE — Progress Notes (Unsigned)
 " [name] [address] [place] [address] Phone: [phone] Subjective:   [name] Julie [name], am serving as a scribe for Dr. Arthea Claudene.  I'm seeing this patient by the request  of:  Larnell Hamilton, MD  CC: bilateral knee exam   YEP:Dlagzrupcz  07/24/2024 Right sided injecting, discussed with patient to monitor the swelling in the lower extremity.  Patient is seeing wound care still at this time.  Follow-up with me again in 6 to 12 weeks     Chronic problem with exacerbation.  Discussed icing regimen and home exercises, has responded well to the injections previously.  Increase activity slowly.  Discussed icing regimen.  Follow-up again in 6 to 12 weeks.     Updated 10/02/2024 Julie [name] is a 84 y.o. female coming in with complaint of B knee and SI joint pain. Patient states that her R knee is painful.   Pain between scapula and in lower back due to leaning over to do a puzzle.        Past Medical History:  Diagnosis Date   Arthritis    Asthma    CAD in native artery 06/06/2017   Mild LAD disease on coronary CT-A 05/2017.   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 Verdel)   Statin myopathy 09/15/2020   Thyroid  disease    TIA (transient ischemic attack)    2017   Transaminitis 09/15/2020   Past Surgical History:  Procedure Laterality Date   ABDOMINAL HYSTERECTOMY     BLADDER SURGERY     hole in bladder from hysterectomy   BREAST SURGERY     biopsy breast benign   EYE SURGERY     bil cataract   TOTAL HIP ARTHROPLASTY Right 08/06/2019   Procedure: RIGHT TOTAL HIP ARTHROPLASTY ANTERIOR APPROACH;  Surgeon: Sheril Coy, MD;  Location: [facility];  Service: Orthopedics;  Laterality: Right;   TOTAL  THYROIDECTOMY     Social History   Socioeconomic History   Marital status: Media Planner    Spouse name: Niels   Number of children: Not on file   Years of education: Not on file   Highest education level: Master's degree (e.g., MA, MS, MEng, MEd, MSW, MBA)  Occupational History   Not on file  Tobacco Use   Smoking status: Never   Smokeless tobacco: Never  Vaping Use   Vaping status: Never Used  Substance and Sexual Activity   Alcohol use: Yes    Comment: 12/02/21 3 daily   Drug use: No   Sexual activity: Not Currently  Other Topics Concern   Not on file  Social History Narrative   Lives with partner   Social Drivers of Health   Tobacco Use: Low Risk (08/22/2024)   Patient History    Smoking Tobacco Use: Never    Smokeless Tobacco Use: Never    Passive Exposure: Not on file  Financial Resource Strain: Not on file  Food Insecurity: No Food Insecurity (06/09/2024)   Received from [facility] and [employer]    Within the past 12 months, you worried that your food would run out before you got the money to buy more.: Never true  Within the past 12 months, the food you bought just didn't last and you didn't have money to get more.: Never true  Transportation Needs: No Transportation Needs (06/09/2024)   Received from [facility] and [employer]    In the past 12 months, has lack of transportation kept you from medical appointments or from getting medications?: No    In the past 12 months, has lack of transportation kept you from meetings, work, or from getting things needed for daily living?: No  Physical Activity: Not on file  Stress: Not on file  Social Connections: Not on file  Depression (EYV7-0): Not on file  Alcohol Screen: Not on file  Housing: Not At Risk (06/09/2024)   Received from [facility]   [employer]    Do you have housing?: Yes    Are you worried about losing your housing?: No    Within the past 12 months, have you or your family  members you live with been unable to get utilities (heat, electricity) when it was really needed?: No  Utilities: Not At Risk (06/09/2024)   Received from [facility]   [employer]    Do you have housing?: Yes    Are you worried about losing your housing?: No    Within the past 12 months, have you or your family members you live with been unable to get utilities (heat, electricity) when it was really needed?: No  Health Literacy: Not on file   Allergies[1] Family History  Problem Relation Age of Onset   Peripheral Artery Disease Mother    Dementia Father    Breast cancer Neg Hx     Current Outpatient Medications (Endocrine & Metabolic):    levothyroxine  (SYNTHROID ) 125 MCG tablet, Take 125 mcg by mouth daily before breakfast.   predniSONE  (DELTASONE ) 20 MG tablet, Take 1 tablet (20 mg total) by mouth daily with breakfast.  Current Outpatient Medications (Respiratory):    diphenhydrAMINE  (BENADRYL ) 25 MG tablet, Take 1 tablet (25 mg total) by mouth every 8 (eight) hours as needed for up to 21 doses for allergies or sleep.  Current Outpatient Medications (Analgesics):    oxyCODONE -acetaminophen  (PERCOCET/ROXICET) 5-325 MG tablet, Take 1 tablet by mouth every 6 (six) hours as needed.   oxyCODONE -acetaminophen  (PERCOCET/ROXICET) 5-325 MG tablet, Take 1 tablet by mouth every 6 (six) hours as needed.  Current Outpatient Medications (Hematological):    APIXABAN  (ELIQUIS ) VTE STARTER PACK (10MG  AND 5MG ), Take as directed on package: start with two-5mg  tablets twice daily for 7 days. On day 8, switch to one-5mg  tablet twice daily.  Current Outpatient Medications (Other):    cholecalciferol (VITAMIN D ) 1000 units tablet, Take 1,000 Units by mouth daily.   Multiple Vitamins-Minerals (PRESERVISION AREDS 2 PO), Take 1 tablet by mouth daily.   Multiple Vitamins-Minerals (MULTIVITAMIN WITH MINERALS) tablet, Take 1 tablet by mouth daily.   Reviewed prior external information including  notes and imaging from  primary care provider As well as notes that were available from care everywhere and other healthcare systems.  Past medical history, social, surgical and family history all reviewed in electronic medical record.  No pertanent information unless stated regarding to the chief complaint.   Review of Systems:  No headache, visual changes, nausea, vomiting, diarrhea, constipation, dizziness, abdominal pain, skin rash, fevers, chills, night sweats, weight loss, swollen lymph nodes, body aches, joint swelling, chest pain, shortness of breath, mood changes. POSITIVE muscle aches  Objective  Blood pressure 110/78, height 5' 5 (  1.651 m), weight 144 lb (65.3 kg).   General: No apparent distress alert and oriented x3 mood and affect normal, dressed appropriately.  HEENT: Pupils equal, extraocular movements intact  Respiratory: Patient's speak in full sentences and does not appear short of breath  Cardiovascular: No lower extremity edema, non tender, no erythema  Low back exam shows  Knee exam shows arthritic changes noted in the knees.  Instability noted.  Trace effusion noted left greater than right.  After informed written and verbal consent, patient was seated on exam table. Right knee was prepped with alcohol swab and utilizing anterolateral approach, patient's right knee space was injected with 4:1  marcaine  0.5%: Kenalog  40mg /dL. Patient tolerated the procedure well without immediate complications.    Impression and Recommendations:    The above documentation has been reviewed and is accurate and complete Arthea CHRISTELLA Sharps, DO        [1]  Allergies Allergen Reactions   Losartan  Anaphylaxis    Diffuse hives and tongue swelling   Propantheline Rash    Cannot remember other symptoms    Atorvastatin      Myalgia    Gluten Meal Diarrhea    bloating   Rosuvastatin     Sulfa Antibiotics Swelling   Zetia  [Ezetimibe ]     Myalgias    Demerol [Meperidine] Rash    Morphine And Codeine Rash   "

## 2024-10-02 ENCOUNTER — Encounter: Payer: Self-pay | Admitting: Family Medicine

## 2024-10-02 ENCOUNTER — Ambulatory Visit: Admitting: Family Medicine

## 2024-10-02 VITALS — BP 110/78 | Ht 65.0 in | Wt 144.0 lb

## 2024-10-02 DIAGNOSIS — M461 Sacroiliitis, not elsewhere classified: Secondary | ICD-10-CM | POA: Diagnosis not present

## 2024-10-02 DIAGNOSIS — M48061 Spinal stenosis, lumbar region without neurogenic claudication: Secondary | ICD-10-CM

## 2024-10-02 DIAGNOSIS — M170 Bilateral primary osteoarthritis of knee: Secondary | ICD-10-CM | POA: Diagnosis not present

## 2024-10-02 MED ORDER — METHYLPREDNISOLONE ACETATE 40 MG/ML IJ SUSP
40.0000 mg | Freq: Once | INTRAMUSCULAR | Status: AC
  Administered 2024-10-02: 40 mg via INTRAMUSCULAR

## 2024-10-02 MED ORDER — KETOROLAC TROMETHAMINE 30 MG/ML IJ SOLN
30.0000 mg | Freq: Once | INTRAMUSCULAR | Status: AC
  Administered 2024-10-02: 30 mg via INTRAMUSCULAR

## 2024-10-02 NOTE — Assessment & Plan Note (Signed)
 Multifactorial, has responded to injections in the sacroiliac joint previously been given a anti-inflammatory this time.  Hopeful that it would be beneficial.

## 2024-10-02 NOTE — Patient Instructions (Addendum)
 Injection in R knee Injections in backside. No NSAIDs for 24 hours.  See me in 10 weeks

## 2024-10-02 NOTE — Assessment & Plan Note (Signed)
 Known arthritis as well, could be playing a role, continue to monitor.

## 2024-10-02 NOTE — Assessment & Plan Note (Signed)
 Will be traveling, discussed icing regimen and home exercises, discussed which activities to do and which ones to avoid.  Increase activity slowly.  Discussed icing regimen.  Follow-up again in 6 to 12 weeks.  Patient wants to avoid any surgical intervention and hopefully will be able to.

## 2024-11-11 ENCOUNTER — Other Ambulatory Visit (HOSPITAL_BASED_OUTPATIENT_CLINIC_OR_DEPARTMENT_OTHER)

## 2024-12-10 ENCOUNTER — Ambulatory Visit: Admitting: Family Medicine
